# Patient Record
Sex: Male | Born: 1955 | Race: White | Hispanic: No | Marital: Married | Smoking: Former smoker
Health system: Southern US, Community
[De-identification: ages and names within clinical notes are randomized; demographics above are authoritative.]

## PROBLEM LIST (undated history)

## (undated) DIAGNOSIS — I10 Essential (primary) hypertension: Secondary | ICD-10-CM

## (undated) DIAGNOSIS — E559 Vitamin D deficiency, unspecified: Secondary | ICD-10-CM

## (undated) DIAGNOSIS — E785 Hyperlipidemia, unspecified: Secondary | ICD-10-CM

## (undated) HISTORY — DX: Vitamin D deficiency, unspecified: E55.9

## (undated) HISTORY — DX: Essential (primary) hypertension: I10

## (undated) HISTORY — DX: Hyperlipidemia, unspecified: E78.5

---
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
## 1997-07-28 ENCOUNTER — Encounter: Admission: RE | Admit: 1997-07-28 | Discharge: 1997-07-28 | Payer: Self-pay | Admitting: Family Medicine

## 1997-12-11 ENCOUNTER — Ambulatory Visit (HOSPITAL_COMMUNITY): Admission: RE | Admit: 1997-12-11 | Discharge: 1997-12-11 | Payer: Self-pay | Admitting: *Deleted

## 1998-10-21 ENCOUNTER — Encounter: Admission: RE | Admit: 1998-10-21 | Discharge: 1998-10-21 | Payer: Self-pay | Admitting: Family Medicine

## 1998-12-14 ENCOUNTER — Encounter: Admission: RE | Admit: 1998-12-14 | Discharge: 1998-12-14 | Payer: Self-pay | Admitting: Family Medicine

## 1999-10-18 ENCOUNTER — Ambulatory Visit (HOSPITAL_COMMUNITY): Admission: RE | Admit: 1999-10-18 | Discharge: 1999-10-18 | Payer: Self-pay | Admitting: Family Medicine

## 1999-10-18 ENCOUNTER — Encounter: Admission: RE | Admit: 1999-10-18 | Discharge: 1999-10-18 | Payer: Self-pay | Admitting: Family Medicine

## 1999-12-20 ENCOUNTER — Encounter: Admission: RE | Admit: 1999-12-20 | Discharge: 1999-12-20 | Payer: Self-pay | Admitting: Family Medicine

## 2000-06-12 ENCOUNTER — Encounter: Admission: RE | Admit: 2000-06-12 | Discharge: 2000-06-12 | Payer: Self-pay | Admitting: Family Medicine

## 2001-11-15 ENCOUNTER — Encounter: Admission: RE | Admit: 2001-11-15 | Discharge: 2001-11-15 | Payer: Self-pay | Admitting: Family Medicine

## 2002-10-15 ENCOUNTER — Encounter: Admission: RE | Admit: 2002-10-15 | Discharge: 2002-10-15 | Payer: Self-pay | Admitting: Sports Medicine

## 2002-10-15 ENCOUNTER — Encounter: Admission: RE | Admit: 2002-10-15 | Discharge: 2002-10-15 | Payer: Self-pay | Admitting: Family Medicine

## 2002-10-15 ENCOUNTER — Encounter: Payer: Self-pay | Admitting: Sports Medicine

## 2002-11-13 ENCOUNTER — Encounter: Admission: RE | Admit: 2002-11-13 | Discharge: 2002-11-13 | Payer: Self-pay | Admitting: Family Medicine

## 2003-03-15 HISTORY — PX: VASECTOMY: SHX75

## 2008-08-26 ENCOUNTER — Ambulatory Visit: Payer: Self-pay | Admitting: Gastroenterology

## 2008-09-08 ENCOUNTER — Encounter: Payer: Self-pay | Admitting: Gastroenterology

## 2008-09-08 ENCOUNTER — Ambulatory Visit: Payer: Self-pay | Admitting: Gastroenterology

## 2008-09-10 ENCOUNTER — Encounter: Payer: Self-pay | Admitting: Gastroenterology

## 2013-01-28 ENCOUNTER — Ambulatory Visit: Payer: Self-pay

## 2013-01-28 ENCOUNTER — Other Ambulatory Visit: Payer: Self-pay | Admitting: Occupational Medicine

## 2013-01-28 DIAGNOSIS — R52 Pain, unspecified: Secondary | ICD-10-CM

## 2013-07-09 ENCOUNTER — Other Ambulatory Visit: Payer: Self-pay | Admitting: Physician Assistant

## 2013-07-28 ENCOUNTER — Other Ambulatory Visit: Payer: Self-pay | Admitting: Physician Assistant

## 2013-07-28 DIAGNOSIS — E785 Hyperlipidemia, unspecified: Secondary | ICD-10-CM | POA: Insufficient documentation

## 2013-07-28 DIAGNOSIS — I10 Essential (primary) hypertension: Secondary | ICD-10-CM | POA: Insufficient documentation

## 2013-07-28 NOTE — Progress Notes (Signed)
Patient ID: Anthony Boone, male   DOB: 07/06/1955, 58 y.o.   MRN: [id number]   Annual Screening Comprehensive Examination  This very nice 58 y.o.  MWM presents for complete physical.  Patient has been followed for Labile HTN,  Prediabetes, Hyperlipidemia, and Vitamin D Deficiency.   Patient relates a work  injury on Nov 17th, 2014 with a torn Lt. Quadriceps for which he underwent surgery by Dr Beverely LowSteve Norris and patient has been out of work for the last 7 months. Ge continues in out-pt therapy, but still has great difficulty trying to walk up or down stairs .   Labile HTN predates since 2010. Patient's follow-up was been very sporatic and today's BP: 138/88 mmHg. Patient denies any cardiac symptoms as chest pain, palpitations, shortness of breath, dizziness or ankle swelling.   Patient's hyperlipidemia is controlled with diet and medications. Patient denies myalgias or other medication SE's. Last cholesterol last visit was 171, triglycerides 340, HDL 40 and LDL 63.     Patient has prediabetes/ with A1c 5.8% in June 2012  and last A1c was 5.4% in Oct 2012. Patient denies reactive hypoglycemic symptoms, visual blurring, diabetic polys or paresthesias.    Finally, patient has history of severe Vitamin D Deficiency of  24 in 2012 and last vitamin D 32 in Oct 2014.  Medication Sig  . bisoprolol-hydrochlorothiazide (ZIAC) 10-6.25 MG per tablet TAKE 1 TABLET BY MOUTH DAILY  . tadalafil (CIALIS) 20 MG tablet Take 20 mg by mouth daily as needed for erectile dysfunction.   No Known Allergies  Past Medical History  Diagnosis Date  . Hyperlipidemia   . Hypertension   . Vitamin D deficiency    Past Surgical History  Procedure Laterality Date  . Vasectomy  2005    Dr Edwin Capuckett  . Appendectomy  1974   Family History  Problem Relation Age of Onset  . Heart disease Father   . Hypertension Brother    History   Social History  . Marital Status: Married    Spouse Name: N/A    Number of Children:  N/A  . Years of Education: N/A   Occupational History  . 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 Currently on worker's comp.   Social History Main Topics  . Smoking status: Former Games developermoker  . Smokeless tobacco: Not on file  . Alcohol Use: Yes  . Drug Use: No  . Sexual Activity: Not on file   Other Topics Concern  . Not on file   Social History Narrative  . No narrative on file    ROS Constitutional: Denies fever, chills, weight loss/gain, headaches, insomnia, fatigue, night sweats or change in appetite. Eyes: Denies redness, blurred vision, diplopia, discharge, itchy or watery eyes.  ENT: Denies discharge, congestion, post nasal drip, epistaxis, sore throat, earache, hearing loss, dental pain, Tinnitus, Vertigo, Sinus pain or snoring.  Cardio: Denies chest pain, palpitations, irregular heartbeat, syncope, dyspnea, diaphoresis, orthopnea, PND, claudication or edema Respiratory: denies cough, dyspnea, DOE, pleurisy, hoarseness, laryngitis or wheezing.  Gastrointestinal: Denies dysphagia, heartburn, reflux, water brash, pain, cramps, nausea, vomiting, bloating, diarrhea, constipation, hematemesis, melena, hematochezia, jaundice or hemorrhoids Genitourinary: Denies dysuria, frequency, urgency, nocturia, hesitancy, discharge, hematuria or flank pain Musculoskeletal: Describes a limp favoring the left leg and pains and decreased ROM of the Left knee. Skin: Denies puritis, rash, hives, warts, acne, eczema or change in skin lesion Neuro: No weakness, tremor, incoordination, spasms, paresthesia or pain Psychiatric: Denies confusion, memory  loss or sensory loss Endocrine: Denies change in weight, skin, hair change, nocturia, and paresthesia, diabetic polys, visual blurring or hyper / hypo glycemic episodes.  Heme/Lymph: No excessive bleeding, bruising or enlarged lymph nodes.  Physical Exam  BP 138/88  Pulse 72  Temp(Src) 98.6 F (37 C)  Resp 16  Ht 5\' 10"  (1.778 m)   Wt 240 lb 9.6 oz (109.135 kg)  BMI 34.52 kg/m2  General Appearance: Well nourished, in no apparent distress. Eyes: PERRLA, EOMs, conjunctiva no swelling or erythema, normal fundi and vessels. Sinuses: No frontal/maxillary tenderness ENT/Mouth: EACs patent / TMs  nl. Nares clear without erythema, swelling, mucoid exudates. Oral hygiene is good. No erythema, swelling, or exudate. Tongue normal, non-obstructing. Tonsils not swollen or erythematous. Hearing normal.  Neck: Supple, thyroid normal. No bruits, nodes or JVD. Respiratory: Respiratory effort normal.  BS equal and clear bilateral without rales, rhonci, wheezing or stridor. Cardio: Heart sounds are normal with regular rate and rhythm and no murmurs, rubs or gallops. Peripheral pulses are normal and equal bilaterally without edema. No aortic or femoral bruits. Chest: symmetric with normal excursions and percussion.  Abdomen: Flat, soft, with bowl sounds. Nontender, no guarding, rebound, hernias, masses, or organomegaly.  Lymphatics: Non tender without lymphadenopathy.  Genitourinary: No hernias.Testes nl. DRE - prostate nl for age - smooth & firm w/o nodules. Musculoskeletal: Full ROM all peripheral extremities except Lt knee which has decreased ROM and crepitus. Skin: Warm and dry without rashes, lesions, cyanosis, clubbing or  ecchymosis.  Neuro: Cranial nerves intact, reflexes equal bilaterally. Normal muscle tone, no cerebellar symptoms. Sensation intact.  Pysch: Awake and oriented X 3, normal affect, insight and judgment appropriate.   Assessment and Plan  1. Annual Screening Examination 2. Hypertension  3. Hyperlipidemia 4. Pre Diabetes 5. Vitamin D Deficiency  Continue prudent diet as discussed, weight control, BP monitoring, regular exercise, and medications as discussed.  Discussed med effects and SE's. Routine screening labs and tests as requested with regular follow-up as recommended.

## 2013-07-28 NOTE — Patient Instructions (Addendum)
The [facility]   By   Dr Monico HoarJoel Fuhrman  Book & Audio CD's    Hypertension As your heart beats, it forces blood through your arteries. This force is your blood pressure. If the pressure is too high, it is called hypertension (HTN) or high blood pressure. HTN is dangerous because you may have it and not know it. High blood pressure may mean that your heart has to work harder to pump blood. Your arteries may be narrow or stiff. The extra work puts you at risk for heart disease, stroke, and other problems.  Blood pressure consists of two numbers, a higher number over a lower, 110/72, for example. It is stated as "110 over 72." The ideal is below 120 for the top number (systolic) and under 80 for the bottom (diastolic). Write down your blood pressure today. You should pay close attention to your blood pressure if you have certain conditions such as:  Heart failure.  Prior heart attack.  Diabetes  Chronic kidney disease.  Prior stroke.  Multiple risk factors for heart disease. To see if you have HTN, your blood pressure should be measured while you are seated with your arm held at the level of the heart. It should be measured at least twice. A one-time elevated blood pressure reading (especially in the Emergency Department) does not mean that you need treatment. There may be conditions in which the blood pressure is different between your right and left arms. It is important to see your caregiver soon for a recheck. Most people have essential hypertension which means that there is not a specific cause. This type of high blood pressure may be lowered by changing lifestyle factors such as:  Stress.  Smoking.  Lack of exercise.  Excessive weight.  Drug/tobacco/alcohol use.  Eating less salt. Most people do not have symptoms from high blood pressure until it has caused damage to the body. Effective treatment can often prevent, delay or reduce that damage. TREATMENT  When a cause  has been identified, treatment for high blood pressure is directed at the cause. There are a large number of medications to treat HTN. These fall into several categories, and your caregiver will help you select the medicines that are best for you. Medications may have side effects. You should review side effects with your caregiver. If your blood pressure stays high after you have made lifestyle changes or started on medicines,   Your medication(s) may need to be changed.  Other problems may need to be addressed.  Be certain you understand your prescriptions, and know how and when to take your medicine.  Be sure to follow up with your caregiver within the time frame advised (usually within two weeks) to have your blood pressure rechecked and to review your medications.  If you are taking more than one medicine to lower your blood pressure, make sure you know how and at what times they should be taken. Taking two medicines at the same time can result in blood pressure that is too low. SEEK IMMEDIATE MEDICAL CARE IF:  You develop a severe headache, blurred or changing vision, or confusion.  You have unusual weakness or numbness, or a faint feeling.  You have severe chest or abdominal pain, vomiting, or breathing problems. MAKE SURE YOU:   Understand these instructions.  Will watch your condition.  Will get help right away if you are not doing well or get worse.   Diabetes and Exercise Exercising regularly is important. It is  not just about losing weight. It has many health benefits, such as:  Improving your overall fitness, flexibility, and endurance.  Increasing your bone density.  Helping with weight control.  Decreasing your body fat.  Increasing your muscle strength.  Reducing stress and tension.  Improving your overall health. People with diabetes who exercise gain additional benefits because exercise:  Reduces appetite.  Improves the body's use of blood sugar  (glucose).  Helps lower or control blood glucose.  Decreases blood pressure.  Helps control blood lipids (such as cholesterol and triglycerides).  Improves the body's use of the hormone insulin by:  Increasing the body's insulin sensitivity.  Reducing the body's insulin needs.  Decreases the risk for heart disease because exercising:  Lowers cholesterol and triglycerides levels.  Increases the levels of good cholesterol (such as high-density lipoproteins [HDL]) in the body.  Lowers blood glucose levels. YOUR ACTIVITY PLAN  Choose an activity that you enjoy and set realistic goals. Your health care provider or diabetes educator can help you make an activity plan that works for you. You can break activities into 2 or 3 sessions throughout the day. Doing so is as good as one long session. Exercise ideas include:  Taking the dog for a walk.  Taking the stairs instead of the elevator.  Dancing to your favorite song.  Doing your favorite exercise with a friend. RECOMMENDATIONS FOR EXERCISING WITH TYPE 1 OR TYPE 2 DIABETES   Check your blood glucose before exercising. If blood glucose levels are greater than 240 mg/dL, check for urine ketones. Do not exercise if ketones are present.  Avoid injecting insulin into areas of the body that are going to be exercised. For example, avoid injecting insulin into:  The arms when playing tennis.  The legs when jogging.  Keep a record of:  Food intake before and after you exercise.  Expected peak times of insulin action.  Blood glucose levels before and after you exercise.  The type and amount of exercise you have done.  Review your records with your health care provider. Your health care provider will help you to develop guidelines for adjusting food intake and insulin amounts before and after exercising.  If you take insulin or oral hypoglycemic agents, watch for signs and symptoms of hypoglycemia. They  include:  Dizziness.  Shaking.  Sweating.  Chills.  Confusion.  Drink plenty of water while you exercise to prevent dehydration or heat stroke. Body water is lost during exercise and must be replaced.  Talk to your health care provider before starting an exercise program to make sure it is safe for you. Remember, almost any type of activity is better than none.    Cholesterol Cholesterol is a white, waxy, fat-like protein needed by your body in small amounts. The liver makes all the cholesterol you need. It is carried from the liver by the blood through the blood vessels. Deposits (plaque) may build up on blood vessel walls. This makes the arteries narrower and stiffer. Plaque increases the risk for heart attack and stroke. You cannot feel your cholesterol level even if it is very high. The only way to know is by a blood test to check your lipid (fats) levels. Once you know your cholesterol levels, you should keep a record of the test results. Work with your caregiver to to keep your levels in the desired range. WHAT THE RESULTS MEAN:  Total cholesterol is a rough measure of all the cholesterol in your blood.  LDL is the  so-called bad cholesterol. This is the type that deposits cholesterol in the walls of the arteries. You want this level to be low.  HDL is the good cholesterol because it cleans the arteries and carries the LDL away. You want this level to be high.  Triglycerides are fat that the body can either burn for energy or store. High levels are closely linked to heart disease. DESIRED LEVELS:  Total cholesterol below 200.  LDL below 100 for people at risk, below 70 for very high risk.  HDL above 50 is good, above 60 is best.  Triglycerides below 150. HOW TO LOWER YOUR CHOLESTEROL:  Diet.  Choose fish or white meat chicken and [place], roasted or baked. Limit fatty cuts of red meat, fried foods, and processed meats, such as sausage and lunch meat.  Eat lots of  fresh fruits and vegetables. Choose whole grains, beans, pasta, potatoes and cereals.  Use only small amounts of olive, corn or canola oils. Avoid butter, mayonnaise, shortening or palm kernel oils. Avoid foods with trans-fats.  Use skim/nonfat milk and low-fat/nonfat yogurt and cheeses. Avoid whole milk, cream, ice cream, egg yolks and cheeses. Healthy desserts include angel food cake, ginger snaps, animal crackers, hard candy, popsicles, and low-fat/nonfat frozen yogurt. Avoid pastries, cakes, pies and cookies.  Exercise.  A regular program helps decrease LDL and raises HDL.  Helps with weight control.  Do things that increase your activity level like gardening, walking, or taking the stairs.  Medication.  May be prescribed by your caregiver to help lowering cholesterol and the risk for heart disease.  You may need medicine even if your levels are normal if you have several risk factors. HOME CARE INSTRUCTIONS   Follow your diet and exercise programs as suggested by your caregiver.  Take medications as directed.  Have blood work done when your caregiver feels it is necessary. MAKE SURE YOU:   Understand these instructions.  Will watch your condition.  Will get help right away if you are not doing well or get worse.      Vitamin D Deficiency Vitamin D is an important vitamin that your body needs. Having too little of it in your body is called a deficiency. A very bad deficiency can make your bones soft and can cause a condition called rickets.  Vitamin D is important to your body for different reasons, such as:   It helps your body absorb 2 minerals called calcium and phosphorus.  It helps make your bones healthy.  It may prevent some diseases, such as diabetes and multiple sclerosis.  It helps your muscles and heart. You can get vitamin D in several ways. It is a natural part of some foods. The vitamin is also added to some dairy products and cereals. Some people  take vitamin D supplements. Also, your body makes vitamin D when you are in the sun. It changes the sun's rays into a form of the vitamin that your body can use. CAUSES   Not eating enough foods that contain vitamin D.  Not getting enough sunlight.  Having certain digestive system diseases that make it hard to absorb vitamin D. These diseases include Crohn's disease, chronic pancreatitis, and cystic fibrosis.  Having a surgery in which part of the stomach or small intestine is removed.  Being obese. Fat cells pull vitamin D out of your blood. That means that obese people may not have enough vitamin D left in their blood and in other body tissues.  Having chronic  kidney or liver disease. RISK FACTORS Risk factors are things that make you more likely to develop a vitamin D deficiency. They include:  Being older.  Not being able to get outside very much.  Living in a nursing home.  Having had broken bones.  Having weak or thin bones (osteoporosis).  Having a disease or condition that changes how your body absorbs vitamin D.  Having dark skin.  Some medicines such as seizure medicines or steroids.  Being overweight or obese. SYMPTOMS Mild cases of vitamin D deficiency may not have any symptoms. If you have a very bad case, symptoms may include:  Bone pain.  Muscle pain.  Falling often.  Broken bones caused by a minor injury, due to osteoporosis. DIAGNOSIS A blood test is the best way to tell if you have a vitamin D deficiency. TREATMENT Vitamin D deficiency can be treated in different ways. Treatment for vitamin D deficiency depends on what is causing it. Options include:  Taking vitamin D supplements.  Taking a calcium supplement. Your caregiver will suggest what dose is best for you. HOME CARE INSTRUCTIONS  Take any supplements that your caregiver prescribes. Follow the directions carefully. Take only the suggested amount.  Have your blood tested 2 months after  you start taking supplements.  Eat foods that contain vitamin D. Healthy choices include:  Fortified dairy products, cereals, or juices. Fortified means vitamin D has been added to the food. Check the label on the package to be sure.  Fatty fish like salmon or trout.  Eggs.  Oysters.  Do not use a tanning bed.  Keep your weight at a healthy level. Lose weight if you need to.  Keep all follow-up appointments. Your caregiver will need to perform blood tests to make sure your vitamin D deficiency is going away. SEEK MEDICAL CARE IF:  You have any questions about your treatment.  You continue to have symptoms of vitamin D deficiency.  You have nausea or vomiting.  You are constipated.  You feel confused.  You have severe abdominal or back pain. MAKE SURE YOU:  Understand these instructions.  Will watch your condition.  Will get help right away if you are not doing well or get worse.

## 2013-07-29 ENCOUNTER — Encounter: Payer: Self-pay | Admitting: Internal Medicine

## 2013-07-29 ENCOUNTER — Ambulatory Visit (INDEPENDENT_AMBULATORY_CARE_PROVIDER_SITE_OTHER): Payer: Federal, State, Local not specified - PPO | Admitting: Internal Medicine

## 2013-07-29 VITALS — BP 138/88 | HR 72 | Temp 98.6°F | Resp 16 | Ht 70.0 in | Wt 240.6 lb

## 2013-07-29 DIAGNOSIS — R7309 Other abnormal glucose: Secondary | ICD-10-CM

## 2013-07-29 DIAGNOSIS — R7402 Elevation of levels of lactic acid dehydrogenase (LDH): Secondary | ICD-10-CM

## 2013-07-29 DIAGNOSIS — R740 Nonspecific elevation of levels of transaminase and lactic acid dehydrogenase [LDH]: Secondary | ICD-10-CM

## 2013-07-29 DIAGNOSIS — Z79899 Other long term (current) drug therapy: Secondary | ICD-10-CM | POA: Insufficient documentation

## 2013-07-29 DIAGNOSIS — Z111 Encounter for screening for respiratory tuberculosis: Secondary | ICD-10-CM

## 2013-07-29 DIAGNOSIS — Z113 Encounter for screening for infections with a predominantly sexual mode of transmission: Secondary | ICD-10-CM

## 2013-07-29 DIAGNOSIS — Z1212 Encounter for screening for malignant neoplasm of rectum: Secondary | ICD-10-CM

## 2013-07-29 DIAGNOSIS — E559 Vitamin D deficiency, unspecified: Secondary | ICD-10-CM

## 2013-07-29 DIAGNOSIS — I10 Essential (primary) hypertension: Secondary | ICD-10-CM

## 2013-07-29 DIAGNOSIS — Z125 Encounter for screening for malignant neoplasm of prostate: Secondary | ICD-10-CM

## 2013-07-29 DIAGNOSIS — Z0000 Encounter for general adult medical examination without abnormal findings: Secondary | ICD-10-CM

## 2013-07-29 HISTORY — DX: Other long term (current) drug therapy: Z79.899

## 2013-07-29 HISTORY — DX: Other abnormal glucose: R73.09

## 2013-07-29 LAB — CBC WITH DIFFERENTIAL/PLATELET
Basophils Absolute: 0.1 10*3/uL (ref 0.0–0.1)
Basophils Relative: 1 % (ref 0–1)
EOS PCT: 1 % (ref 0–5)
Eosinophils Absolute: 0.1 10*3/uL (ref 0.0–0.7)
HCT: 43 % (ref 39.0–52.0)
Hemoglobin: 15.2 g/dL (ref 13.0–17.0)
Lymphocytes Relative: 35 % (ref 12–46)
Lymphs Abs: 1.9 10*3/uL (ref 0.7–4.0)
MCH: 30 pg (ref 26.0–34.0)
MCHC: 35.3 g/dL (ref 30.0–36.0)
MCV: 85 fL (ref 78.0–100.0)
Monocytes Absolute: 0.4 10*3/uL (ref 0.1–1.0)
Monocytes Relative: 7 % (ref 3–12)
Neutro Abs: 3 10*3/uL (ref 1.7–7.7)
Neutrophils Relative %: 56 % (ref 43–77)
Platelets: 155 10*3/uL (ref 150–400)
RBC: 5.06 MIL/uL (ref 4.22–5.81)
RDW: 14.8 % (ref 11.5–15.5)
WBC: 5.4 10*3/uL (ref 4.0–10.5)

## 2013-07-29 LAB — HEMOGLOBIN A1C
Hgb A1c MFr Bld: 5.8 % — ABNORMAL HIGH (ref <5.7)
Mean Plasma Glucose: 120 mg/dL — ABNORMAL HIGH (ref <117)

## 2013-07-29 MED ORDER — HYDROCHLOROTHIAZIDE 25 MG PO TABS: 25.0000 mg | ORAL_TABLET | Freq: Every day | ORAL | Status: DC

## 2013-07-29 MED ORDER — BISOPROLOL-HYDROCHLOROTHIAZIDE 10-6.25 MG PO TABS: 1.0000 | ORAL_TABLET | Freq: Every day | ORAL | Status: DC

## 2013-07-29 MED ORDER — VITAMIN D (ERGOCALCIFEROL) 1.25 MG (50000 UNIT) PO CAPS: ORAL_CAPSULE | ORAL | Status: AC

## 2013-07-30 LAB — LIPID PANEL
CHOL/HDL RATIO: 3.8 ratio
Cholesterol: 125 mg/dL (ref 0–200)
HDL: 33 mg/dL — AB (ref >39)
LDL CALC: 50 mg/dL (ref 0–99)
TRIGLYCERIDES: 211 mg/dL — AB (ref <150)
VLDL: 42 mg/dL — ABNORMAL HIGH (ref 0–40)

## 2013-07-30 LAB — HEPATIC FUNCTION PANEL
ALK PHOS: 64 U/L (ref 39–117)
ALT: 25 U/L (ref 0–53)
AST: 19 U/L (ref 0–37)
Albumin: 4.6 g/dL (ref 3.5–5.2)
BILIRUBIN DIRECT: 0.1 mg/dL (ref 0.0–0.3)
BILIRUBIN INDIRECT: 0.4 mg/dL (ref 0.2–1.2)
BILIRUBIN TOTAL: 0.5 mg/dL (ref 0.2–1.2)
Total Protein: 7 g/dL (ref 6.0–8.3)

## 2013-07-30 LAB — HEPATITIS B CORE ANTIBODY, TOTAL: Hep B Core Total Ab: NONREACTIVE

## 2013-07-30 LAB — BASIC METABOLIC PANEL WITH GFR
BUN: 13 mg/dL (ref 6–23)
CO2: 24 meq/L (ref 19–32)
CREATININE: 0.88 mg/dL (ref 0.50–1.35)
Calcium: 9.1 mg/dL (ref 8.4–10.5)
Chloride: 103 mEq/L (ref 96–112)
GFR, Est African American: >89 mL/min
GFR, Est Non African American: >89 mL/min
Glucose, Bld: 84 mg/dL (ref 70–99)
Potassium: 4.3 mEq/L (ref 3.5–5.3)
SODIUM: 138 meq/L (ref 135–145)

## 2013-07-30 LAB — MAGNESIUM: Magnesium: 1.9 mg/dL (ref 1.5–2.5)

## 2013-07-30 LAB — MICROALBUMIN / CREATININE URINE RATIO
Creatinine, Urine: 125.3 mg/dL
MICROALB/CREAT RATIO: 5.3 mg/g (ref 0.0–30.0)
Microalb, Ur: 0.67 mg/dL (ref 0.00–1.89)

## 2013-07-30 LAB — HIV ANTIBODY (ROUTINE TESTING W REFLEX): HIV 1&2 Ab, 4th Generation: NONREACTIVE

## 2013-07-30 LAB — HEPATITIS C ANTIBODY: HCV AB: NEGATIVE

## 2013-07-30 LAB — VITAMIN B12: VITAMIN B 12: 214 pg/mL (ref 211–911)

## 2013-07-30 LAB — RPR

## 2013-07-30 LAB — TSH: TSH: 1.613 u[IU]/mL (ref 0.350–4.500)

## 2013-07-30 LAB — INSULIN, FASTING: Insulin fasting, serum: 38 u[IU]/mL — ABNORMAL HIGH (ref 3–28)

## 2013-07-30 LAB — URINALYSIS, MICROSCOPIC ONLY
Bacteria, UA: NONE SEEN
CASTS: NONE SEEN
Crystals: NONE SEEN
Squamous Epithelial / LPF: NONE SEEN

## 2013-07-30 LAB — TESTOSTERONE: TESTOSTERONE: 252 ng/dL — AB (ref 300–890)

## 2013-07-30 LAB — PSA: PSA: 0.59 ng/mL (ref <=4.00)

## 2013-07-30 LAB — VITAMIN D 25 HYDROXY (VIT D DEFICIENCY, FRACTURES): Vit D, 25-Hydroxy: 30 ng/mL (ref 30–89)

## 2013-07-30 LAB — HEPATITIS A ANTIBODY, TOTAL: HEP A TOTAL AB: NONREACTIVE

## 2013-07-30 LAB — HEPATITIS B SURFACE ANTIBODY,QUALITATIVE: Hep B S Ab: NEGATIVE

## 2013-07-31 LAB — HEPATITIS B E ANTIBODY: Hepatitis Be Antibody: NONREACTIVE

## 2013-09-05 ENCOUNTER — Other Ambulatory Visit: Payer: Self-pay

## 2013-09-05 MED ORDER — SILDENAFIL CITRATE 20 MG PO TABS: ORAL_TABLET | ORAL | Status: DC

## 2013-11-01 ENCOUNTER — Ambulatory Visit: Payer: Self-pay | Admitting: Physician Assistant

## 2014-02-03 ENCOUNTER — Ambulatory Visit: Payer: Self-pay | Admitting: Internal Medicine

## 2014-07-04 ENCOUNTER — Encounter: Payer: Self-pay | Admitting: Internal Medicine

## 2014-07-04 ENCOUNTER — Ambulatory Visit: Payer: Self-pay | Admitting: Internal Medicine

## 2014-07-04 VITALS — BP 142/88 | HR 64 | Temp 97.3°F | Resp 16 | Ht 70.0 in | Wt 239.8 lb

## 2014-07-04 DIAGNOSIS — E559 Vitamin D deficiency, unspecified: Secondary | ICD-10-CM

## 2014-07-04 DIAGNOSIS — Z79899 Other long term (current) drug therapy: Secondary | ICD-10-CM

## 2014-07-04 DIAGNOSIS — R7303 Prediabetes: Secondary | ICD-10-CM

## 2014-07-04 DIAGNOSIS — I10 Essential (primary) hypertension: Secondary | ICD-10-CM

## 2014-07-04 DIAGNOSIS — E785 Hyperlipidemia, unspecified: Secondary | ICD-10-CM

## 2014-07-04 NOTE — Progress Notes (Signed)
   Subjective:    Patient ID: Anthony Boone, male    DOB: 02/13/1956, 59 y.o.   MRN: [id number]  HPI Pt presents with a several day hx/o a tender tip of his nose. Medication Sig  . hydrochlorothiazide (HYDRODIURIL) 25 MG tablet Take 1 tablet (25 mg total) by mouth daily. For fluid  . sildenafil (REVATIO) 20 MG tablet Take one to five tablets daily as needed  . bisoprolol-hctz (ZIAC) 10-6.25 MG per tablet TAKE 1 TABLET BY MOUTH DAILY   No Known Allergies  Review of Systems 10 point systems review negative except as above.     Objective:   Physical Exam BP 142/88 mmHg  Pulse 64  Temp(Src) 97.3 F (36.3 C)  Resp 16  Ht 5\' 10"  (1.778 m)  Wt 239 lb 12.8 oz (108.773 kg)  BMI 34.41 kg/m2  Tip of nose is tender with a hint of slight erythema. No pustules seem. No flagrant cellulitis/lymphangitis.   Naso/oropharynx - clear    Assessment & Plan:   1) Cellulitis , nose   Rx: Keflex 500 mg #56 - qid  Discussed meds/SE's.  ROV - prn

## 2014-07-10 ENCOUNTER — Encounter: Payer: Self-pay | Admitting: Physician Assistant

## 2014-07-10 ENCOUNTER — Ambulatory Visit (INDEPENDENT_AMBULATORY_CARE_PROVIDER_SITE_OTHER): Payer: BLUE CROSS/BLUE SHIELD | Admitting: Physician Assistant

## 2014-07-10 VITALS — BP 122/78 | HR 72 | Temp 97.7°F | Resp 16 | Ht 70.0 in | Wt 239.0 lb

## 2014-07-10 DIAGNOSIS — R8290 Unspecified abnormal findings in urine: Secondary | ICD-10-CM

## 2014-07-10 DIAGNOSIS — Z79899 Other long term (current) drug therapy: Secondary | ICD-10-CM

## 2014-07-10 DIAGNOSIS — R109 Unspecified abdominal pain: Secondary | ICD-10-CM

## 2014-07-10 DIAGNOSIS — M545 Low back pain, unspecified: Secondary | ICD-10-CM

## 2014-07-10 LAB — HEPATIC FUNCTION PANEL
ALT: 22 U/L (ref 0–53)
AST: 18 U/L (ref 0–37)
Albumin: 4.4 g/dL (ref 3.5–5.2)
Alkaline Phosphatase: 68 U/L (ref 39–117)
Bilirubin, Direct: 0.1 mg/dL (ref 0.0–0.3)
Indirect Bilirubin: 0.6 mg/dL (ref 0.2–1.2)
TOTAL PROTEIN: 7.5 g/dL (ref 6.0–8.3)
Total Bilirubin: 0.7 mg/dL (ref 0.2–1.2)

## 2014-07-10 LAB — BASIC METABOLIC PANEL WITH GFR
BUN: 16 mg/dL (ref 6–23)
CALCIUM: 9.2 mg/dL (ref 8.4–10.5)
CO2: 27 meq/L (ref 19–32)
CREATININE: 0.98 mg/dL (ref 0.50–1.35)
Chloride: 101 mEq/L (ref 96–112)
GFR, EST NON AFRICAN AMERICAN: 85 mL/min
GFR, Est African American: >89 mL/min
Glucose, Bld: 93 mg/dL (ref 70–99)
Potassium: 4.2 mEq/L (ref 3.5–5.3)
Sodium: 139 mEq/L (ref 135–145)

## 2014-07-10 LAB — CBC WITH DIFFERENTIAL/PLATELET
Basophils Absolute: 0.1 10*3/uL (ref 0.0–0.1)
Basophils Relative: 1 % (ref 0–1)
EOS ABS: 0.1 10*3/uL (ref 0.0–0.7)
EOS PCT: 2 % (ref 0–5)
HEMATOCRIT: 46.2 % (ref 39.0–52.0)
Hemoglobin: 16.1 g/dL (ref 13.0–17.0)
LYMPHS ABS: 2.2 10*3/uL (ref 0.7–4.0)
LYMPHS PCT: 33 % (ref 12–46)
MCH: 29.9 pg (ref 26.0–34.0)
MCHC: 34.8 g/dL (ref 30.0–36.0)
MCV: 85.9 fL (ref 78.0–100.0)
MONO ABS: 0.4 10*3/uL (ref 0.1–1.0)
MONOS PCT: 6 % (ref 3–12)
MPV: 9.4 fL (ref 8.6–12.4)
Neutro Abs: 3.9 10*3/uL (ref 1.7–7.7)
Neutrophils Relative %: 58 % (ref 43–77)
Platelets: 156 10*3/uL (ref 150–400)
RBC: 5.38 MIL/uL (ref 4.22–5.81)
RDW: 14.8 % (ref 11.5–15.5)
WBC: 6.8 10*3/uL (ref 4.0–10.5)

## 2014-07-10 LAB — MAGNESIUM: Magnesium: 2 mg/dL (ref 1.5–2.5)

## 2014-07-10 NOTE — Patient Instructions (Addendum)
I think it is possible that you have sleep apnea. It can cause interrupted sleep, headaches, frequent awakenings, fatigue, dry mouth, fast/slow heart beats, memory issues, anxiety/depression, swelling, numbness tingling hands/feet, weight gain, shortness of breath, and the list goes on. Sleep apnea needs to be ruled out because if it is left untreated it does eventually lead to abnormal heart beats, lung failure or heart failure as well as increasing the risk of heart attack and stroke. There are masks you can wear OR a mouth piece that I can give you information about. Often times though people feel MUCH better after getting treatment.   Sleep Apnea  Sleep apnea is a sleep disorder characterized by abnormal pauses in breathing while you sleep. When your breathing pauses, the level of oxygen in your blood decreases. This causes you to move out of deep sleep and into light sleep. As a result, your quality of sleep is poor, and the system that carries your blood throughout your body (cardiovascular system) experiences stress. If sleep apnea remains untreated, the following conditions can develop:  High blood pressure (hypertension).  Coronary artery disease.  Inability to achieve or maintain an erection (impotence).  Impairment of your thought process (cognitive dysfunction). There are three types of sleep apnea: 1. Obstructive sleep apnea--Pauses in breathing during sleep because of a blocked airway. 2. Central sleep apnea--Pauses in breathing during sleep because the area of the brain that controls your breathing does not send the correct signals to the muscles that control breathing. 3. Mixed sleep apnea--A combination of both obstructive and central sleep apnea.  RISK FACTORS The following risk factors can increase your risk of developing sleep apnea:  Being overweight.  Smoking.  Having narrow passages in your nose and throat.  Being of older age.  Being male.  Alcohol use.   Sedative and tranquilizer use.  Ethnicity. Among individuals younger than 35 years, African Americans are at increased risk of sleep apnea. SYMPTOMS   Difficulty staying asleep.  Daytime sleepiness and fatigue.  Loss of energy.  Irritability.  Loud, heavy snoring.  Morning headaches.  Trouble concentrating.  Forgetfulness.  Decreased interest in sex. DIAGNOSIS  In order to diagnose sleep apnea, your caregiver will perform a physical examination. Your caregiver may suggest that you take a home sleep test. Your caregiver may also recommend that you spend the night in a sleep lab. In the sleep lab, several monitors record information about your heart, lungs, and brain while you sleep. Your leg and arm movements and blood oxygen level are also recorded. TREATMENT The following actions may help to resolve mild sleep apnea:  Sleeping on your side.   Using a decongestant if you have nasal congestion.   Avoiding the use of depressants, including alcohol, sedatives, and narcotics.   Losing weight and modifying your diet if you are overweight. There also are devices and treatments to help open your airway:  Oral appliances. These are custom-made mouthpieces that shift your lower jaw forward and slightly open your bite. This opens your airway.  Devices that create positive airway pressure. This positive pressure "splints" your airway open to help you breathe better during sleep. The following devices create positive airway pressure:  Continuous positive airway pressure (CPAP) device. The CPAP device creates a continuous level of air pressure with an air pump. The air is delivered to your airway through a mask while you sleep. This continuous pressure keeps your airway open.  Nasal expiratory positive airway pressure (EPAP) device. The EPAP device  creates positive air pressure as you exhale. The device consists of single-use valves, which are inserted into each nostril and held in  place by adhesive. The valves create very little resistance when you inhale but create much more resistance when you exhale. That increased resistance creates the positive airway pressure. This positive pressure while you exhale keeps your airway open, making it easier to breath when you inhale again.  Bilevel positive airway pressure (BPAP) device. The BPAP device is used mainly in patients with central sleep apnea. This device is similar to the CPAP device because it also uses an air pump to deliver continuous air pressure through a mask. However, with the BPAP machine, the pressure is set at two different levels. The pressure when you exhale is lower than the pressure when you inhale.  Surgery. Typically, surgery is only done if you cannot comply with less invasive treatments or if the less invasive treatments do not improve your condition. Surgery involves removing excess tissue in your airway to create a wider passage way. Document Released: 02/18/2002 Document Revised: 06/25/2012 Document Reviewed: 07/07/2011 [facility] Patient Information 2015 [place], [place]. This information is not intended to replace advice given to you by your health care provider. Make sure you discuss any questions you have with your health care provider.   Flank Pain Flank pain refers to pain that is located on the side of the body between the upper abdomen and the back. The pain may occur over a short period of time (acute) or may be long-term or reoccurring (chronic). It may be mild or severe. Flank pain can be caused by many things. CAUSES  Some of the more common causes of flank pain include:  Muscle strains.   Muscle spasms.   A disease of your spine (vertebral disk disease).   A lung infection (pneumonia).   Fluid around your lungs (pulmonary edema).   A kidney infection.   Kidney stones.   A very painful skin rash caused by the chickenpox virus (shingles).   Gallbladder disease.  HOME CARE  INSTRUCTIONS  Home care will depend on the cause of your pain. In general,  Rest as directed by your caregiver.  Drink enough fluids to keep your urine clear or pale yellow.  Only take over-the-counter or prescription medicines as directed by your caregiver. Some medicines may help relieve the pain.  Tell your caregiver about any changes in your pain.  Follow up with your caregiver as directed. SEEK IMMEDIATE MEDICAL CARE IF:   Your pain is not controlled with medicine.   You have new or worsening symptoms.  Your pain increases.   You have abdominal pain.   You have shortness of breath.   You have persistent nausea or vomiting.   You have swelling in your abdomen.   You feel faint or pass out.   You have blood in your urine.  You have a fever or persistent symptoms for more than 2-3 days.  You have a fever and your symptoms suddenly get worse. MAKE SURE YOU:   Understand these instructions.  Will watch your condition.  Will get help right away if you are not doing well or get worse. Document Released: 04/21/2005 Document Revised: 11/23/2011 Document Reviewed: 10/13/2011 [facility] Patient Information 2015 [place], [place]. This information is not intended to replace advice given to you by your health care provider. Make sure you discuss any questions you have with your health care provider.

## 2014-07-10 NOTE — Progress Notes (Signed)
Subjective:    Patient ID: Anthony Boone, male    DOB: 10/18/1955, 59 y.o.   MRN: [id number]  HPI 59 y.o. male complains of back pain and history of tick bite. Tick bite 1 week ago, on less than 24 hours.  Back pain x 3 days, dull ache started on left and now on both side, some radiation to the AB. No injuries. Pain is worse with lying on his left side, sitting up is better. Has been on zantac for reflux and has been on ibuprofen for pain.  + AB bloating, nausea, heartburn, did have chills yesterday, dark urine.  No rashes, fever, vomiting, diarrhea, constipation, hesitancy, dribbling, frequency, urgency. Does have a history of kidney stones.   Blood pressure 122/78, pulse 72, temperature 97.7 F (36.5 C), resp. rate 16, height 5\' 10"  (1.778 m), weight 239 lb (108.41 kg).   Current Outpatient Prescriptions on File Prior to Visit  Medication Sig Dispense Refill  . bisoprolol-hydrochlorothiazide (ZIAC) 10-6.25 MG per tablet Take 1 tablet by mouth daily. For BP 90 tablet 99  . hydrochlorothiazide (HYDRODIURIL) 25 MG tablet Take 1 tablet (25 mg total) by mouth daily. For fluid 90 tablet 99  . pseudoephedrine-guaifenesin (MUCINEX D) 60-600 MG per tablet Take by mouth.    . sildenafil (REVATIO) 20 MG tablet Take one to five tablets daily as needed 30 tablet prn   No current facility-administered medications on file prior to visit.   Past Medical History  Diagnosis Date  . Hyperlipidemia   . Hypertension   . Vitamin D deficiency     Review of Systems  Constitutional: Positive for chills. Negative for fever, diaphoresis, activity change, appetite change, fatigue and unexpected weight change.  HENT: Negative.   Respiratory: Negative.   Cardiovascular: Negative.   Gastrointestinal: Positive for nausea, abdominal pain and abdominal distention. Negative for vomiting, diarrhea, constipation, blood in stool, anal bleeding and rectal pain.  Genitourinary: Positive for flank pain. Negative for  dysuria, urgency, frequency, hematuria, decreased urine volume, discharge, penile swelling, scrotal swelling, enuresis, difficulty urinating, genital sores, penile pain and testicular pain.       Dark urine  Musculoskeletal: Positive for back pain. Negative for myalgias, joint swelling, arthralgias, gait problem, neck pain and neck stiffness.  Skin: Negative.   Neurological: Negative.   Psychiatric/Behavioral: Negative.        Objective:   Physical Exam  Constitutional: He appears well-developed and well-nourished. No distress.  HENT:  Head: Normocephalic and atraumatic.  Crowded mouth  Eyes: Conjunctivae are normal. Pupils are equal, round, and reactive to light.  Neck: Normal range of motion. Neck supple.  Cardiovascular: Normal rate and regular rhythm.   No murmur heard. Pulmonary/Chest: Breath sounds normal. No respiratory distress. He has no wheezes. He has no rales. He exhibits no tenderness.  Abdominal: Soft. Bowel sounds are normal. He exhibits no distension and no mass. There is no hepatosplenomegaly, splenomegaly or hepatomegaly. There is tenderness (epigastric). There is CVA tenderness. There is no rebound, no guarding and negative Murphy's sign. No hernia.  Musculoskeletal:  Patient is able to ambulate well. Gait is  Antalgic. Straight leg raising with dorsiflexion negative bilaterally for radicular symptoms. Sensory exam in the legs are normal. Knee reflexes are normal Ankle reflexes are normal Strength is normal and symmetric in arms and legs. There is not SI tenderness to palpation.  There isparaspinal muscle spasm.  There is not midline tenderness.  ROM of spine with  limited in all spheres due to pain.  Lymphadenopathy:    He has no cervical adenopathy.  Skin: Skin is warm and dry. No rash noted.      Assessment & Plan:  Left back/flank pain Does have a history of kidney stones, check labs, declines pain meds at this time, increase fluids, may be musculoskeletal as  well with pain with movement, continue ibuprofen, heat/ice, rest.   Tick bite Less than a day, no rash, will monitor.

## 2014-07-11 LAB — URINALYSIS, ROUTINE W REFLEX MICROSCOPIC
BILIRUBIN URINE: NEGATIVE
GLUCOSE, UA: NEGATIVE mg/dL
Hgb urine dipstick: NEGATIVE
Ketones, ur: NEGATIVE mg/dL
Leukocytes, UA: NEGATIVE
Nitrite: NEGATIVE
PH: 6.5 (ref 5.0–8.0)
Protein, ur: NEGATIVE mg/dL
SPECIFIC GRAVITY, URINE: 1.016 (ref 1.005–1.030)
Urobilinogen, UA: 0.2 mg/dL (ref 0.0–1.0)

## 2014-07-11 LAB — URINE CULTURE
COLONY COUNT: NO GROWTH
Organism ID, Bacteria: NO GROWTH

## 2014-07-31 ENCOUNTER — Ambulatory Visit (INDEPENDENT_AMBULATORY_CARE_PROVIDER_SITE_OTHER): Payer: BLUE CROSS/BLUE SHIELD | Admitting: Internal Medicine

## 2014-07-31 ENCOUNTER — Other Ambulatory Visit: Payer: Self-pay | Admitting: Internal Medicine

## 2014-07-31 ENCOUNTER — Encounter: Payer: Self-pay | Admitting: Internal Medicine

## 2014-07-31 VITALS — BP 146/96 | HR 68 | Temp 97.1°F | Resp 16 | Ht 70.0 in | Wt 239.4 lb

## 2014-07-31 DIAGNOSIS — E559 Vitamin D deficiency, unspecified: Secondary | ICD-10-CM

## 2014-07-31 DIAGNOSIS — Z125 Encounter for screening for malignant neoplasm of prostate: Secondary | ICD-10-CM

## 2014-07-31 DIAGNOSIS — Z111 Encounter for screening for respiratory tuberculosis: Secondary | ICD-10-CM

## 2014-07-31 DIAGNOSIS — I10 Essential (primary) hypertension: Secondary | ICD-10-CM

## 2014-07-31 DIAGNOSIS — R7303 Prediabetes: Secondary | ICD-10-CM

## 2014-07-31 DIAGNOSIS — Z1212 Encounter for screening for malignant neoplasm of rectum: Secondary | ICD-10-CM

## 2014-07-31 DIAGNOSIS — E785 Hyperlipidemia, unspecified: Secondary | ICD-10-CM

## 2014-07-31 DIAGNOSIS — Z79899 Other long term (current) drug therapy: Secondary | ICD-10-CM

## 2014-07-31 DIAGNOSIS — R5383 Other fatigue: Secondary | ICD-10-CM

## 2014-07-31 DIAGNOSIS — R7309 Other abnormal glucose: Secondary | ICD-10-CM

## 2014-07-31 LAB — CBC WITH DIFFERENTIAL/PLATELET
Basophils Absolute: 0.1 10*3/uL (ref 0.0–0.1)
Basophils Relative: 1 % (ref 0–1)
EOS ABS: 0.1 10*3/uL (ref 0.0–0.7)
Eosinophils Relative: 1 % (ref 0–5)
HCT: 46.9 % (ref 39.0–52.0)
Hemoglobin: 16.2 g/dL (ref 13.0–17.0)
Lymphocytes Relative: 32 % (ref 12–46)
Lymphs Abs: 2.1 10*3/uL (ref 0.7–4.0)
MCH: 30.1 pg (ref 26.0–34.0)
MCHC: 34.5 g/dL (ref 30.0–36.0)
MCV: 87 fL (ref 78.0–100.0)
MONOS PCT: 8 % (ref 3–12)
MPV: 9.3 fL (ref 8.6–12.4)
Monocytes Absolute: 0.5 10*3/uL (ref 0.1–1.0)
Neutro Abs: 3.8 10*3/uL (ref 1.7–7.7)
Neutrophils Relative %: 58 % (ref 43–77)
PLATELETS: 153 10*3/uL (ref 150–400)
RBC: 5.39 MIL/uL (ref 4.22–5.81)
RDW: 15 % (ref 11.5–15.5)
WBC: 6.5 10*3/uL (ref 4.0–10.5)

## 2014-07-31 MED ORDER — LISINOPRIL 40 MG PO TABS: ORAL_TABLET | ORAL | Status: DC

## 2014-07-31 NOTE — Progress Notes (Signed)
Patient ID: Anthony Boone, male   DOB: 05/31/1955, 59 y.o.   MRN: [id number]   Annual Comprehensive Examination  This very nice 59 y.o. MWM presents for complete physical.  Patient has been followed for HTN, Prediabetes, Hyperlipidemia, and Vitamin D Deficiency.   Labile HTN predates since 2010. Patient's BP has been controlled at home.Today's BP: (!) 146/96 mmHg. Patient denies any cardiac symptoms as chest pain, palpitations, shortness of breath, dizziness or ankle swelling.   Patient's hyperlipidemia is controlled with diet. Patient denies myalgias or other medication SE's. Last lipids were   Lab Results  Component Value Date   CHOL 125 07/29/2013   HDL 33* 07/29/2013   LDLCALC 50 07/29/2013   TRIG 211* 07/29/2013   CHOLHDL 3.8 07/29/2013    Patient has Morbid Obesity (BMI 34.35) and consequent prediabetes since June 2012 with A1c 5.8% and patient denies reactive hypoglycemic symptoms, visual blurring, diabetic polys or paresthesias. Last A1c was      Finally, patient has history of Vitamin D Deficiency of "24" in 2012 and last vitamin D was still very low at "30" in May 2015.      Medication Sig  . bisoprolol-hydrochlorothiazide (ZIAC) 10-6.25 MG per tablet TAKE 1 TABLET EVERY DAY FOR BLOOD PRESSURE  . hydrochlorothiazide (HYDRODIURIL) 25 MG tablet TAKE 1 TABLET (25 MG TOTAL) BY MOUTH DAILY. FOR FLUID  . pseudoephedrine-guaifenesin (MUCINEX D) 60-600 MG per tablet Take by mouth.  . sildenafil (REVATIO) 20 MG tablet Take one to five tablets daily as needed    No Known Allergies Past Medical History  Diagnosis Date  . Hyperlipidemia   . Hypertension   . Vitamin D deficiency    Health Maintenance  Topic Date Due  . INFLUENZA VACCINE  10/13/2014  . COLONOSCOPY  09/09/2018  . TETANUS/TDAP  03/14/2021  . HIV Screening  Completed   Immunization History  Administered Date(s) Administered  . Td 11/12/2001  . Tdap 03/15/2011   Past Surgical History  Procedure Laterality Date   . Vasectomy  2005    Dr Edwin Capuckett  . Appendectomy  1974   Family History  Problem Relation Age of Onset  . Heart disease Father   . Hypertension Brother    History   Social History  . Marital Status: Married    Spouse Name: N/A  . Number of Children: N/A  . Years of Education: N/A   Occupational History  . Not on file.   Social History Main Topics  . Smoking status: Former Games developermoker  . Smokeless tobacco: Not on file  . Alcohol Use: Yes  . Drug Use: No  . Sexual Activity: Not on file    ROS Constitutional: Denies fever, chills, weight loss/gain, headaches, insomnia,  night sweats or change in appetite. Does c/o fatigue. Eyes: Denies redness, blurred vision, diplopia, discharge, itchy or watery eyes.  ENT: Denies discharge, congestion, post nasal drip, epistaxis, sore throat, earache, hearing loss, dental pain, Tinnitus, Vertigo, Sinus pain or snoring.  Cardio: Denies chest pain, palpitations, irregular heartbeat, syncope, dyspnea, diaphoresis, orthopnea, PND, claudication or edema Respiratory: denies cough, dyspnea, DOE, pleurisy, hoarseness, laryngitis or wheezing.  Gastrointestinal: Denies dysphagia, heartburn, reflux, water brash, pain, cramps, nausea, vomiting, bloating, diarrhea, constipation, hematemesis, melena, hematochezia, jaundice or hemorrhoids Genitourinary: Denies dysuria, frequency, urgency, nocturia, hesitancy, discharge, hematuria or flank pain Musculoskeletal: Denies arthralgia, myalgia, stiffness, Jt. Swelling, pain, limp or strain/sprain. Denies Falls. Skin: Denies puritis, rash, hives, warts, acne, eczema or change in skin lesion Neuro: No weakness, tremor, incoordination, spasms,  paresthesia or pain Psychiatric: Denies confusion, memory loss or sensory loss. Denies Depression. Endocrine: Denies change in weight, skin, hair change, nocturia, and paresthesia, diabetic polys, visual blurring or hyper / hypo glycemic episodes.  Heme/Lymph: No excessive bleeding,  bruising or enlarged lymph nodes.  Physical Exam  BP 146/96   Pulse 68  Temp 97.1 F   Resp 16  Ht 5\' 10"    Wt 239 lb 6.4 oz     BMI 34.35  General Appearance: Well nourished, in no apparent distress. Eyes: PERRLA, EOMs, conjunctiva no swelling or erythema, normal fundi and vessels. Sinuses: No frontal/maxillary tenderness ENT/Mouth: EACs patent / TMs  nl. Nares clear without erythema, swelling, mucoid exudates. Oral hygiene is good. No erythema, swelling, or exudate. Tongue normal, non-obstructing. Tonsils not swollen or erythematous. Hearing normal.  Neck: Supple, thyroid normal. No bruits, nodes or JVD. Respiratory: Respiratory effort normal.  BS equal and clear bilateral without rales, rhonci, wheezing or stridor. Cardio: Heart sounds are normal with regular rate and rhythm and no murmurs, rubs or gallops. Peripheral pulses are normal and equal bilaterally without edema. No aortic or femoral bruits. Chest: symmetric with normal excursions and percussion.  Abdomen: Flat, soft, with bowel sounds. Nontender, no guarding, rebound, hernias, masses, or organomegaly.  Lymphatics: Non tender without lymphadenopathy.  Genitourinary: No hernias.Testes nl. DRE - prostate nl for age - smooth & firm w/o nodules. Musculoskeletal: Full ROM all peripheral extremities, joint stability, 5/5 strength, and normal gait. Skin: Warm and dry without rashes, lesions, cyanosis, clubbing or  ecchymosis.  Neuro: Cranial nerves intact, reflexes equal bilaterally. Normal muscle tone, no cerebellar symptoms. Sensation intact.  Pysch: Awake and oriented X 3 with normal affect, insight and judgment appropriate.   Assessment and Plan  1. Essential hypertension   - Microalbumin / creatinine urine ratio - EKG 12-Lead - US, RETROPERITNL ABD,  LTD  2. Hyperlipidemia  - Lipid panel  3. Prediabetes  - Hemoglobin A1c - Insulin, random  4. Vitamin D deficiency  - Vit D  25 hydroxy   5. Prostate cancer  screening   6. Screening for rectal cancer  - POC Hemoccult Bld/Stl  - PSA  7. Other fatigue  - Vitamin B12 - Testosterone - Iron and TIBC - TSH  8. Medication management  - Urine Microscopic - CBC with Differential/Platelet - BASIC METABOLIC PANEL WITH GFR - Hepatic function panel - Magnesium  9. Morbid Obesity  (BMI 34.35) -   - Long discussion re: weight - diet - better eating habits and patient desires to try an appetite supressant   - Rx - Phentermine 37.5 mg #30 x 5 rf  - ROV 3 mo   Continue prudent diet as discussed, weight control, BP monitoring, regular exercise, and medications as discussed.  Discussed med effects and SE's. Routine screening labs and tests as requested with regular follow-up as recommended.  Over 40 minutes of exam, counseling &  chart review was performed

## 2014-07-31 NOTE — Patient Instructions (Signed)

## 2014-08-01 LAB — BASIC METABOLIC PANEL WITH GFR
BUN: 12 mg/dL (ref 6–23)
CO2: 21 mEq/L (ref 19–32)
Calcium: 8.7 mg/dL (ref 8.4–10.5)
Chloride: 104 mEq/L (ref 96–112)
Creat: 0.9 mg/dL (ref 0.50–1.35)
GFR, Est African American: >89 mL/min
GFR, Est Non African American: >89 mL/min
GLUCOSE: 97 mg/dL (ref 70–99)
POTASSIUM: 4.4 meq/L (ref 3.5–5.3)
Sodium: 136 mEq/L (ref 135–145)

## 2014-08-01 LAB — HEPATIC FUNCTION PANEL
ALK PHOS: 66 U/L (ref 39–117)
ALT: 26 U/L (ref 0–53)
AST: 20 U/L (ref 0–37)
Albumin: 4.5 g/dL (ref 3.5–5.2)
BILIRUBIN DIRECT: 0.1 mg/dL (ref 0.0–0.3)
BILIRUBIN INDIRECT: 0.6 mg/dL (ref 0.2–1.2)
BILIRUBIN TOTAL: 0.7 mg/dL (ref 0.2–1.2)
Total Protein: 7 g/dL (ref 6.0–8.3)

## 2014-08-01 LAB — MICROALBUMIN / CREATININE URINE RATIO
Creatinine, Urine: 99.3 mg/dL
Microalb Creat Ratio: 10.1 mg/g (ref 0.0–30.0)
Microalb, Ur: 1 mg/dL (ref <2.0)

## 2014-08-01 LAB — IRON AND TIBC
%SAT: 39 % (ref 20–55)
Iron: 118 ug/dL (ref 42–165)
TIBC: 303 ug/dL (ref 215–435)
UIBC: 185 ug/dL (ref 125–400)

## 2014-08-01 LAB — INSULIN, RANDOM: INSULIN: 24 u[IU]/mL — AB (ref 2.0–19.6)

## 2014-08-01 LAB — LIPID PANEL
CHOL/HDL RATIO: 4.7 ratio
CHOLESTEROL: 132 mg/dL (ref 0–200)
HDL: 28 mg/dL — AB (ref >=40)
LDL Cholesterol: 55 mg/dL (ref 0–99)
Triglycerides: 246 mg/dL — ABNORMAL HIGH (ref <150)
VLDL: 49 mg/dL — AB (ref 0–40)

## 2014-08-01 LAB — MAGNESIUM: Magnesium: 1.9 mg/dL (ref 1.5–2.5)

## 2014-08-01 LAB — URINALYSIS, MICROSCOPIC ONLY
BACTERIA UA: NONE SEEN
Casts: NONE SEEN
Crystals: NONE SEEN
Squamous Epithelial / LPF: NONE SEEN

## 2014-08-01 LAB — TESTOSTERONE: Testosterone: 320 ng/dL (ref 300–890)

## 2014-08-01 LAB — VITAMIN D 25 HYDROXY (VIT D DEFICIENCY, FRACTURES): Vit D, 25-Hydroxy: 23 ng/mL — ABNORMAL LOW (ref 30–100)

## 2014-08-01 LAB — PSA: PSA: 0.64 ng/mL (ref <=4.00)

## 2014-08-01 LAB — HEMOGLOBIN A1C
Hgb A1c MFr Bld: 5.9 % — ABNORMAL HIGH (ref <5.7)
Mean Plasma Glucose: 123 mg/dL — ABNORMAL HIGH (ref <117)

## 2014-08-01 LAB — TSH: TSH: 2.768 u[IU]/mL (ref 0.350–4.500)

## 2014-08-01 LAB — VITAMIN B12: VITAMIN B 12: 297 pg/mL (ref 211–911)

## 2014-08-06 LAB — TB SKIN TEST
Induration: 0 mm
TB SKIN TEST: NEGATIVE

## 2014-10-03 ENCOUNTER — Encounter: Payer: Self-pay | Admitting: Gastroenterology

## 2015-01-21 ENCOUNTER — Other Ambulatory Visit: Payer: Self-pay | Admitting: Internal Medicine

## 2015-02-02 ENCOUNTER — Encounter: Payer: Self-pay | Admitting: Internal Medicine

## 2015-02-02 ENCOUNTER — Ambulatory Visit (INDEPENDENT_AMBULATORY_CARE_PROVIDER_SITE_OTHER): Payer: BLUE CROSS/BLUE SHIELD | Admitting: Internal Medicine

## 2015-02-02 VITALS — BP 132/80 | HR 62 | Temp 98.1°F | Resp 16 | Ht 70.0 in | Wt 236.0 lb

## 2015-02-02 DIAGNOSIS — E785 Hyperlipidemia, unspecified: Secondary | ICD-10-CM

## 2015-02-02 DIAGNOSIS — J329 Chronic sinusitis, unspecified: Secondary | ICD-10-CM

## 2015-02-02 DIAGNOSIS — E559 Vitamin D deficiency, unspecified: Secondary | ICD-10-CM

## 2015-02-02 DIAGNOSIS — R7303 Prediabetes: Secondary | ICD-10-CM | POA: Diagnosis not present

## 2015-02-02 DIAGNOSIS — Z79899 Other long term (current) drug therapy: Secondary | ICD-10-CM

## 2015-02-02 DIAGNOSIS — I10 Essential (primary) hypertension: Secondary | ICD-10-CM

## 2015-02-02 DIAGNOSIS — M25562 Pain in left knee: Secondary | ICD-10-CM

## 2015-02-02 LAB — CBC WITH DIFFERENTIAL/PLATELET
BASOS PCT: 1 % (ref 0–1)
Basophils Absolute: 0.1 10*3/uL (ref 0.0–0.1)
EOS ABS: 0.1 10*3/uL (ref 0.0–0.7)
Eosinophils Relative: 1 % (ref 0–5)
HCT: 48.5 % (ref 39.0–52.0)
Hemoglobin: 16.8 g/dL (ref 13.0–17.0)
LYMPHS ABS: 2.2 10*3/uL (ref 0.7–4.0)
Lymphocytes Relative: 30 % (ref 12–46)
MCH: 29.6 pg (ref 26.0–34.0)
MCHC: 34.6 g/dL (ref 30.0–36.0)
MCV: 85.4 fL (ref 78.0–100.0)
MONO ABS: 0.6 10*3/uL (ref 0.1–1.0)
MONOS PCT: 8 % (ref 3–12)
MPV: 9.2 fL (ref 8.6–12.4)
NEUTROS PCT: 60 % (ref 43–77)
Neutro Abs: 4.3 10*3/uL (ref 1.7–7.7)
Platelets: 181 10*3/uL (ref 150–400)
RBC: 5.68 MIL/uL (ref 4.22–5.81)
RDW: 13.9 % (ref 11.5–15.5)
WBC: 7.2 10*3/uL (ref 4.0–10.5)

## 2015-02-02 LAB — HEPATIC FUNCTION PANEL
ALBUMIN: 4.5 g/dL (ref 3.6–5.1)
ALT: 24 U/L (ref 9–46)
AST: 20 U/L (ref 10–35)
Alkaline Phosphatase: 67 U/L (ref 40–115)
BILIRUBIN TOTAL: 0.7 mg/dL (ref 0.2–1.2)
Bilirubin, Direct: 0.1 mg/dL (ref <=0.2)
Indirect Bilirubin: 0.6 mg/dL (ref 0.2–1.2)
Total Protein: 7.6 g/dL (ref 6.1–8.1)

## 2015-02-02 LAB — BASIC METABOLIC PANEL WITH GFR
BUN: 21 mg/dL (ref 7–25)
CALCIUM: 9.1 mg/dL (ref 8.6–10.3)
CHLORIDE: 104 mmol/L (ref 98–110)
CO2: 24 mmol/L (ref 20–31)
CREATININE: 0.97 mg/dL (ref 0.70–1.33)
GFR, Est African American: >89 mL/min (ref >=60)
GFR, Est Non African American: 85 mL/min (ref >=60)
Glucose, Bld: 86 mg/dL (ref 65–99)
Potassium: 4.5 mmol/L (ref 3.5–5.3)
SODIUM: 137 mmol/L (ref 135–146)

## 2015-02-02 LAB — LIPID PANEL
Cholesterol: 170 mg/dL (ref 125–200)
HDL: 28 mg/dL — ABNORMAL LOW (ref >=40)
Total CHOL/HDL Ratio: 6.1 Ratio — ABNORMAL HIGH (ref <=5.0)
Triglycerides: 462 mg/dL — ABNORMAL HIGH (ref <150)

## 2015-02-02 LAB — TSH: TSH: 1.598 u[IU]/mL (ref 0.350–4.500)

## 2015-02-02 LAB — HEMOGLOBIN A1C
HEMOGLOBIN A1C: 5.9 % — AB (ref <5.7)
MEAN PLASMA GLUCOSE: 123 mg/dL — AB (ref <117)

## 2015-02-02 MED ORDER — AZITHROMYCIN 250 MG PO TABS: ORAL_TABLET | ORAL | Status: DC

## 2015-02-02 MED ORDER — PREDNISONE 20 MG PO TABS: ORAL_TABLET | ORAL | Status: DC

## 2015-02-02 NOTE — Progress Notes (Signed)
Patient ID: ROSHAWN AYALA, male   DOB: 07-31-1955, 59 y.o.   MRN: [id number]  Assessment and Plan:  Hypertension:  -Continue medication,  -monitor blood pressure at home.  -Continue DASH diet.   -Reminder to go to the ER if any CP, SOB, nausea, dizziness, severe HA, changes vision/speech, left arm numbness and tingling, and jaw pain.  Cholesterol: -Continue diet and exercise.  -Check cholesterol.   Pre-diabetes: -Continue diet and exercise.  -Check A1C  Vitamin D Def: -check level -continue medications.   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 Further disposition pending results of labs.  HPI 59 y.o. male  presents for 3 month follow up with hypertension, hyperlipidemia, prediabetes and vitamin D.   His blood pressure has been controlled at home, today their BP is BP: 132/80 mmHg.   He does not workout. He denies chest pain, shortness of breath, dizziness.   He is on cholesterol medication and denies myalgias. His cholesterol is at goal. The cholesterol last visit was:   Lab Results  Component Value Date   CHOL 132 07/31/2014   HDL 28* 07/31/2014   LDLCALC 55 07/31/2014   TRIG 246* 07/31/2014   CHOLHDL 4.7 07/31/2014     He has been working on diet and exercise for prediabetes, and denies foot ulcerations, hyperglycemia, hypoglycemia , increased appetite, nausea, paresthesia of the feet, polydipsia, polyuria, visual disturbances, vomiting and weight loss. Last A1C in the office was:  Lab Results  Component Value Date   HGBA1C 5.9* 07/31/2014    Patient is on Vitamin D supplement.  Lab Results  Component Value Date   VD25OH 23* 07/31/2014     He reports that for the past 3 weeks he has been having a lot coughing and is mostly dry.  He  occasionally gets some green stuff up.  He reports that the coughing is worse at nighttime.  He reports that he has a lot of sinus congestion and pressure.  He has green nasal sputum as well. He reports that he has been taking dayquil. He reports no help.  He does not have seasonal allergies per his report.  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  He has continued to do his PT at home but it is aching and very sore and he has a lot of limited mobility secondary to it.     Current Medications:  Current Outpatient Prescriptions on File Prior to Visit  Medication Sig Dispense Refill  . bisoprolol-hydrochlorothiazide (ZIAC) 10-6.25 MG per tablet TAKE 1 TABLET EVERY DAY FOR BLOOD PRESSURE 90 tablet 3  . lisinopril (PRINIVIL,ZESTRIL) 40 MG tablet TAKE 1/2 TO 1 TABLET DAILY AS DIRECTED FOR BP AND KIDNEY PROTECTION 90 tablet 1  . pseudoephedrine-guaifenesin (MUCINEX D) 60-600 MG per tablet Take by mouth.    . sildenafil (REVATIO) 20 MG tablet Take one to five tablets daily as needed 30 tablet prn   No current facility-administered medications on file prior to visit.    Medical History:  Past Medical History  Diagnosis Date  . Hyperlipidemia   . Hypertension   . Vitamin D deficiency     Allergies: No Known Allergies   Review of Systems:  Review of Systems  Constitutional: Negative  for fever, chills and malaise/fatigue.  HENT: Positive for congestion. Negative for ear pain and sore throat.   Eyes: Negative.   Respiratory: Positive for cough and shortness of breath. Negative for wheezing.   Cardiovascular: Negative for chest pain, palpitations and leg swelling.  Gastrointestinal: Negative for heartburn, nausea, diarrhea, constipation, blood in stool and melena.  Genitourinary: Negative.   Skin: Negative.   Neurological: Negative for dizziness, sensory change, loss of consciousness and headaches.   Psychiatric/Behavioral: Negative for depression. The patient is not nervous/anxious and does not have insomnia.     Family history- Review and unchanged  Social history- Review and unchanged  Physical Exam: BP 132/80 mmHg  Pulse 62  Temp(Src) 98.1 F (36.7 C) (Temporal)  Resp 16  Ht 5\' 10"  (1.778 m)  Wt 236 lb (107.049 kg)  BMI 33.86 kg/m2  SpO2 98% Wt Readings from Last 3 Encounters:  02/02/15 236 lb (107.049 kg)  07/31/14 239 lb 6.4 oz (108.591 kg)  07/10/14 239 lb (108.41 kg)    General Appearance: Well nourished well developed, in no apparent distress. Eyes: PERRLA, EOMs, conjunctiva no swelling or erythema ENT/Mouth: Ear canals normal without obstruction, swelling, erythma, discharge.  TMs normal bilaterally.  Oropharynx moist, clear, without exudate, or postoropharyngeal swelling. Neck: Supple, thyroid normal,no cervical adenopathy  Respiratory: Respiratory effort normal, Breath sounds clear A&P without rhonchi, wheeze, or rale.  No retractions, no accessory usage. Cardio: RRR with no MRGs. Brisk peripheral pulses without edema.  Abdomen: Soft, + BS,  Non tender, no guarding, rebound, hernias, masses. Musculoskeletal: Full ROM, 5/5 strength, antalgic gait to the left.  Palpable tension of the left VMO and anterior quad.  Skin: Warm, dry without rashes, lesions, ecchymosis.  Neuro: Awake and oriented X 3, Cranial nerves intact. Normal muscle tone, no cerebellar symptoms. Psych: Normal affect, Insight and Judgment appropriate.    Terri Piedraourtney Forcucci, PA-C 8:57 AM [facility]

## 2015-05-28 ENCOUNTER — Other Ambulatory Visit: Payer: Self-pay | Admitting: Internal Medicine

## 2015-05-28 DIAGNOSIS — M25569 Pain in unspecified knee: Secondary | ICD-10-CM

## 2015-08-04 ENCOUNTER — Other Ambulatory Visit: Payer: Self-pay | Admitting: Internal Medicine

## 2015-08-14 ENCOUNTER — Encounter: Payer: Self-pay | Admitting: Internal Medicine

## 2016-08-05 ENCOUNTER — Other Ambulatory Visit: Payer: Self-pay | Admitting: Internal Medicine

## 2016-08-15 ENCOUNTER — Ambulatory Visit (INDEPENDENT_AMBULATORY_CARE_PROVIDER_SITE_OTHER): Payer: BLUE CROSS/BLUE SHIELD | Admitting: Physician Assistant

## 2016-08-15 ENCOUNTER — Other Ambulatory Visit: Payer: Self-pay | Admitting: *Deleted

## 2016-08-15 VITALS — BP 144/98 | HR 80 | Temp 97.3°F | Resp 16 | Ht 70.0 in | Wt 233.6 lb

## 2016-08-15 DIAGNOSIS — R7303 Prediabetes: Secondary | ICD-10-CM | POA: Diagnosis not present

## 2016-08-15 DIAGNOSIS — E785 Hyperlipidemia, unspecified: Secondary | ICD-10-CM | POA: Diagnosis not present

## 2016-08-15 DIAGNOSIS — E559 Vitamin D deficiency, unspecified: Secondary | ICD-10-CM | POA: Diagnosis not present

## 2016-08-15 DIAGNOSIS — W57XXXA Bitten or stung by nonvenomous insect and other nonvenomous arthropods, initial encounter: Secondary | ICD-10-CM

## 2016-08-15 DIAGNOSIS — Z79899 Other long term (current) drug therapy: Secondary | ICD-10-CM

## 2016-08-15 DIAGNOSIS — I10 Essential (primary) hypertension: Secondary | ICD-10-CM

## 2016-08-15 LAB — CBC WITH DIFFERENTIAL/PLATELET
Basophils Absolute: 0 cells/uL (ref 0–200)
Basophils Relative: 0 %
EOS ABS: 120 {cells}/uL (ref 15–500)
Eosinophils Relative: 2 %
HEMATOCRIT: 47.1 % (ref 38.5–50.0)
Hemoglobin: 15.7 g/dL (ref 13.2–17.1)
LYMPHS PCT: 28 %
Lymphs Abs: 1680 cells/uL (ref 850–3900)
MCH: 28.6 pg (ref 27.0–33.0)
MCHC: 33.3 g/dL (ref 32.0–36.0)
MCV: 85.8 fL (ref 80.0–100.0)
MONO ABS: 480 {cells}/uL (ref 200–950)
MPV: 9.4 fL (ref 7.5–12.5)
Monocytes Relative: 8 %
NEUTROS ABS: 3720 {cells}/uL (ref 1500–7800)
Neutrophils Relative %: 62 %
PLATELETS: 163 10*3/uL (ref 140–400)
RBC: 5.49 MIL/uL (ref 4.20–5.80)
RDW: 14.1 % (ref 11.0–15.0)
WBC: 6 10*3/uL (ref 3.8–10.8)

## 2016-08-15 LAB — TSH: TSH: 2.84 m[IU]/L (ref 0.40–4.50)

## 2016-08-15 MED ORDER — LISINOPRIL 40 MG PO TABS: ORAL_TABLET | ORAL | 0 refills | Status: DC

## 2016-08-15 MED ORDER — SILDENAFIL CITRATE 20 MG PO TABS: ORAL_TABLET | ORAL | 1 refills | Status: DC

## 2016-08-15 MED ORDER — BISOPROLOL-HYDROCHLOROTHIAZIDE 10-6.25 MG PO TABS: ORAL_TABLET | ORAL | 0 refills | Status: DC

## 2016-08-15 NOTE — Patient Instructions (Signed)
We want weight loss that will last so you should lose 1-2 pounds a week.  THAT IS IT! Please pick THREE things a month to change. Once it is a habit check off the item. Then pick another three items off the list to become habits.  If you are already doing a habit on the list GREAT!  Cross that item off! o Don't drink your calories. Ie, alcohol, soda, fruit juice, and sweet tea.  o Drink more water. Drink a glass when you feel hungry or before each meal.  o Eat breakfast - Complex carb and protein (likeDannon light and fit yogurt, oatmeal, fruit, eggs, [place] bacon). o Measure your cereal.  Eat no more than one cup a day. (ie [place]) o Eat an apple a day. o Add a vegetable a day. o Try a new vegetable a month. o Use Pam! Stop using oil or butter to cook. o Don't finish your plate or use smaller plates. o Share your dessert. o Eat sugar free Jello for dessert or frozen grapes. o Don't eat 2-3 hours before bed. o Switch to whole wheat bread, pasta, and brown rice. o Make healthier choices when you eat out. No fries! o Pick baked chicken, NOT fried. o Don't forget to SLOW DOWN when you eat. It is not going anywhere.  o Take the stairs. o Park far away in the parking lot o [employer] (or weights) for 10 minutes while watching TV. o Walk at work for 10 minutes during break. o Walk outside 1 time a week with your friend, kids, dog, or significant other. o Start a walking group at [place] the mall as much as you can tolerate.  o Keep a food diary. o Weigh yourself daily. o Walk for 15 minutes 3 days per week. o Cook at home more often and eat out less.  If life happens and you go back to old habits, it is okay.  Just start over. You can do it!   If you experience chest pain, get short of breath, or tired during the exercise, please stop immediately and inform your doctor.    Simple math prevails.    1st - exercise does not produce significant weight loss - at best one converts fat  into muscle , "bulks up", loses inches, but usually stays "weight neutral"     2nd - think of your body weightas a check book: If you eat more calories than you burn up - you save money or gain weight .... Or if you spend more money than you put in the check book, ie burn up more calories than you eat, then you lose weight     3rd - if you walk or run 1 mile, you burn up 100 calories - you have to burn up 3,500 calories to lose 1 pound, ie you have to walk/run 35 miles to lose 1 measly pound. So if you want to lose 10 #, then you have to walk/run 350 miles, so.... clearly exercise is not the solution.     4. So if you consume 1,500 calories, then you have to burn up the equivalent of 15 miles to stay weight neutral - It also stands to reason that if you consume 1,500 cal/day and don't lose weight, then you must be burning up about 1,500 cals/day to stay weight neutral.     5. If you really want to lose weight, you must cut your calorie intake 300 calories /day and  at that rate you should lose about 1 # every 3 days.   6. Please purchase Dr Francis DowseJoel Fuhrman's book(s) "The End of Dieting" & "Eat to Live" . It has some great concepts and recipes.      Triglycerides are simple sugars in blood that are converted into a storage form. I recommend you avoid fried/greasy foods, sweets/candy, white rice , white potatoes,  anything made from white flour, sweet tea, soda, fruit juices and avoid alcohol in excess. Sweet potatoes, brown/wild rice/Quinoa, Vegetarian, spinach, or wheat pasta, Multi-grain bread - like multi-grain flat bread or sandwich thins are okay.

## 2016-08-15 NOTE — Progress Notes (Signed)
Patient ID: Anthony Boone, male   DOB: 08/15/1955, 61 y.o.   MRN: [id number]  Assessment and Plan:  Hypertension:  -Continue medication,  -monitor blood pressure at home.  -Continue DASH diet.   -Reminder to go to the ER if any CP, SOB, nausea, dizziness, severe HA, changes vision/speech, left arm numbness and tingling, and jaw pain.  Cholesterol: -Continue diet and exercise.  -Check cholesterol.   Pre-diabetes: -Continue diet and exercise.  -Check A1C  Vitamin D Def: -check level -continue medications.   Morbid Obesity with co morbidities - long discussion about weight loss, diet, and exercise  Bug bite No evidence of infection, mild erythema but no warmth, pus, streaking,  Hydrocortisone, if worse call office.   Continue diet and meds as discussed. Further disposition pending results of labs.  HPI 61 y.o. male  presents for 3 month follow up with hypertension, hyperlipidemia, prediabetes and vitamin D.   His blood pressure has not been controlled at home, off BP meds x 4-5 weeks, today their BP is BP: (!) 144/98.   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 He denies chest pain, shortness of breath, dizziness. He had a bite on his arm Friday or sat , right arm. Unknown what bit him, no fever, chills, some redness at the site. No rash anywhere else.    He is on cholesterol medication and denies myalgias. His cholesterol is at goal. The cholesterol last visit was:   Lab Results  Component Value Date   CHOL 170 02/02/2015   HDL 28 (L) 02/02/2015   LDLCALC NOT CALC 02/02/2015   TRIG 462 (H) 02/02/2015   CHOLHDL 6.1 (H) 02/02/2015     He has been working on diet and exercise for prediabetes, and denies foot ulcerations, hyperglycemia, hypoglycemia , increased appetite, nausea, paresthesia of the feet, polydipsia, polyuria, visual disturbances, vomiting and weight loss. Last A1C in the office was:  Lab  Results  Component Value Date   HGBA1C 5.9 (H) 02/02/2015    Patient is on Vitamin D supplement.  Lab Results  Component Value Date   VD25OH 23 (L) 07/31/2014     BMI is Body mass index is 33.52 kg/m., he is working on diet and exercise. Wt Readings from Last 3 Encounters:  08/15/16 233 lb 9.6 oz (106 kg)  02/02/15 236 lb (107 kg)  07/31/14 239 lb 6.4 oz (108.6 kg)    Current Medications:  No current outpatient prescriptions on file prior to visit.   No current facility-administered medications on file prior to visit.     Medical History:  Past Medical History:  Diagnosis Date  . Hyperlipidemia   . Hypertension   . Vitamin D deficiency     Allergies: No Known Allergies   Review of Systems:  Review of Systems  Constitutional: Negative for chills, fever and malaise/fatigue.  HENT: Negative for congestion, ear pain and sore throat.   Eyes: Negative.   Respiratory: Negative for cough, shortness of breath and wheezing.   Cardiovascular: Negative for chest pain, palpitations and leg swelling.  Gastrointestinal: Negative for blood in stool, constipation, diarrhea, heartburn, melena and nausea.  Genitourinary: Negative.   Skin: Negative.   Neurological: Negative for dizziness, sensory change, loss of consciousness and headaches.  Psychiatric/Behavioral: Negative for depression. The patient is not nervous/anxious and does not have insomnia.     Family history- Review and unchanged  Social history- Review and unchanged  Physical Exam:  BP (!) 144/98   Pulse 80   Temp 97.3 F (36.3 C)   Resp 16   Ht 5\' 10"  (1.778 m)   Wt 233 lb 9.6 oz (106 kg)   BMI 33.52 kg/m  Wt Readings from Last 3 Encounters:  08/15/16 233 lb 9.6 oz (106 kg)  02/02/15 236 lb (107 kg)  07/31/14 239 lb 6.4 oz (108.6 kg)    General Appearance: Well nourished well developed, in no apparent distress. Eyes: PERRLA, EOMs, conjunctiva no swelling or erythema ENT/Mouth: Ear canals normal without  obstruction, swelling, erythma, discharge.  TMs normal bilaterally.  Oropharynx moist, clear, without exudate, or postoropharyngeal swelling. Neck: Supple, thyroid normal,no cervical adenopathy  Respiratory: Respiratory effort normal, Breath sounds clear A&P without rhonchi, wheeze, or rale.  No retractions, no accessory usage. Cardio: RRR with no MRGs. Brisk peripheral pulses without edema.  Abdomen: Soft, + BS,  Non tender, no guarding, rebound, hernias, masses. Musculoskeletal: Full ROM, 5/5 strength, antalgic gait, right knee wrapped Skin: right posterior proximal arm with erythematous 0.5cm welp, no warmth, streaking, pus. Warm, dry without rashes, lesions, ecchymosis.  Neuro: Awake and oriented X 3, Cranial nerves intact. Normal muscle tone, no cerebellar symptoms. Psych: Normal affect, Insight and Judgment appropriate.    Quentin Mulling, PA-C 12:39 PM [facility]

## 2016-08-16 LAB — LIPID PANEL
CHOL/HDL RATIO: 3.9 ratio (ref <5.0)
Cholesterol: 156 mg/dL (ref <200)
HDL: 40 mg/dL — ABNORMAL LOW (ref >40)
LDL Cholesterol: 83 mg/dL (ref <100)
Triglycerides: 164 mg/dL — ABNORMAL HIGH (ref <150)
VLDL: 33 mg/dL — ABNORMAL HIGH (ref <30)

## 2016-08-16 LAB — BASIC METABOLIC PANEL WITH GFR
BUN: 11 mg/dL (ref 7–25)
CHLORIDE: 104 mmol/L (ref 98–110)
CO2: 23 mmol/L (ref 20–31)
Calcium: 9.3 mg/dL (ref 8.6–10.3)
Creat: 0.97 mg/dL (ref 0.70–1.25)
GFR, EST NON AFRICAN AMERICAN: 84 mL/min (ref >=60)
GLUCOSE: 79 mg/dL (ref 65–99)
Potassium: 4.2 mmol/L (ref 3.5–5.3)
Sodium: 138 mmol/L (ref 135–146)

## 2016-08-16 LAB — HEPATIC FUNCTION PANEL
ALK PHOS: 78 U/L (ref 40–115)
ALT: 24 U/L (ref 9–46)
AST: 17 U/L (ref 10–35)
Albumin: 4.7 g/dL (ref 3.6–5.1)
BILIRUBIN INDIRECT: 0.5 mg/dL (ref 0.2–1.2)
BILIRUBIN TOTAL: 0.6 mg/dL (ref 0.2–1.2)
Bilirubin, Direct: 0.1 mg/dL (ref <=0.2)
Total Protein: 7.6 g/dL (ref 6.1–8.1)

## 2016-08-16 LAB — HEMOGLOBIN A1C
Hgb A1c MFr Bld: 5.4 % (ref <5.7)
MEAN PLASMA GLUCOSE: 108 mg/dL

## 2016-08-16 LAB — MAGNESIUM: Magnesium: 2 mg/dL (ref 1.5–2.5)

## 2016-08-16 LAB — VITAMIN D 25 HYDROXY (VIT D DEFICIENCY, FRACTURES): Vit D, 25-Hydroxy: 24 ng/mL — ABNORMAL LOW (ref 30–100)

## 2016-08-16 NOTE — Progress Notes (Signed)
LVM for pt to return office call for LAB results.

## 2016-08-17 NOTE — Progress Notes (Signed)
Pt aware of lab results & voiced understanding of those results.

## 2016-09-07 ENCOUNTER — Encounter: Payer: Self-pay | Admitting: Internal Medicine

## 2016-09-27 DIAGNOSIS — M25661 Stiffness of right knee, not elsewhere classified: Secondary | ICD-10-CM | POA: Diagnosis not present

## 2016-09-27 DIAGNOSIS — R29898 Other symptoms and signs involving the musculoskeletal system: Secondary | ICD-10-CM | POA: Diagnosis not present

## 2016-09-27 DIAGNOSIS — M25561 Pain in right knee: Secondary | ICD-10-CM | POA: Diagnosis not present

## 2016-09-30 DIAGNOSIS — M25661 Stiffness of right knee, not elsewhere classified: Secondary | ICD-10-CM | POA: Diagnosis not present

## 2016-09-30 DIAGNOSIS — M25561 Pain in right knee: Secondary | ICD-10-CM | POA: Diagnosis not present

## 2016-09-30 DIAGNOSIS — R29898 Other symptoms and signs involving the musculoskeletal system: Secondary | ICD-10-CM | POA: Diagnosis not present

## 2016-10-04 DIAGNOSIS — M25561 Pain in right knee: Secondary | ICD-10-CM | POA: Diagnosis not present

## 2016-10-04 DIAGNOSIS — R29898 Other symptoms and signs involving the musculoskeletal system: Secondary | ICD-10-CM | POA: Diagnosis not present

## 2016-10-04 DIAGNOSIS — M25661 Stiffness of right knee, not elsewhere classified: Secondary | ICD-10-CM | POA: Diagnosis not present

## 2016-10-06 DIAGNOSIS — R29898 Other symptoms and signs involving the musculoskeletal system: Secondary | ICD-10-CM | POA: Diagnosis not present

## 2016-10-06 DIAGNOSIS — M25561 Pain in right knee: Secondary | ICD-10-CM | POA: Diagnosis not present

## 2016-10-06 DIAGNOSIS — M25661 Stiffness of right knee, not elsewhere classified: Secondary | ICD-10-CM | POA: Diagnosis not present

## 2016-10-07 DIAGNOSIS — B029 Zoster without complications: Secondary | ICD-10-CM | POA: Diagnosis not present

## 2016-10-07 DIAGNOSIS — L821 Other seborrheic keratosis: Secondary | ICD-10-CM | POA: Diagnosis not present

## 2016-10-18 DIAGNOSIS — M25561 Pain in right knee: Secondary | ICD-10-CM | POA: Diagnosis not present

## 2016-10-18 DIAGNOSIS — R29898 Other symptoms and signs involving the musculoskeletal system: Secondary | ICD-10-CM | POA: Diagnosis not present

## 2016-10-18 DIAGNOSIS — M25661 Stiffness of right knee, not elsewhere classified: Secondary | ICD-10-CM | POA: Diagnosis not present

## 2016-11-01 DIAGNOSIS — M25661 Stiffness of right knee, not elsewhere classified: Secondary | ICD-10-CM | POA: Diagnosis not present

## 2016-11-01 DIAGNOSIS — R29898 Other symptoms and signs involving the musculoskeletal system: Secondary | ICD-10-CM | POA: Diagnosis not present

## 2016-11-01 DIAGNOSIS — M25561 Pain in right knee: Secondary | ICD-10-CM | POA: Diagnosis not present

## 2016-11-04 DIAGNOSIS — R21 Rash and other nonspecific skin eruption: Secondary | ICD-10-CM | POA: Diagnosis not present

## 2016-11-04 DIAGNOSIS — Z8619 Personal history of other infectious and parasitic diseases: Secondary | ICD-10-CM | POA: Diagnosis not present

## 2016-11-07 DIAGNOSIS — M25561 Pain in right knee: Secondary | ICD-10-CM | POA: Diagnosis not present

## 2016-11-07 DIAGNOSIS — R29898 Other symptoms and signs involving the musculoskeletal system: Secondary | ICD-10-CM | POA: Diagnosis not present

## 2016-11-07 DIAGNOSIS — M25661 Stiffness of right knee, not elsewhere classified: Secondary | ICD-10-CM | POA: Diagnosis not present

## 2016-11-11 DIAGNOSIS — R29898 Other symptoms and signs involving the musculoskeletal system: Secondary | ICD-10-CM | POA: Diagnosis not present

## 2016-11-11 DIAGNOSIS — M25561 Pain in right knee: Secondary | ICD-10-CM | POA: Diagnosis not present

## 2016-11-11 DIAGNOSIS — M25661 Stiffness of right knee, not elsewhere classified: Secondary | ICD-10-CM | POA: Diagnosis not present

## 2016-11-14 ENCOUNTER — Other Ambulatory Visit: Payer: Self-pay | Admitting: Internal Medicine

## 2016-11-18 DIAGNOSIS — R29898 Other symptoms and signs involving the musculoskeletal system: Secondary | ICD-10-CM | POA: Diagnosis not present

## 2016-11-18 DIAGNOSIS — M25661 Stiffness of right knee, not elsewhere classified: Secondary | ICD-10-CM | POA: Diagnosis not present

## 2016-11-18 DIAGNOSIS — M25561 Pain in right knee: Secondary | ICD-10-CM | POA: Diagnosis not present

## 2016-11-22 DIAGNOSIS — M25561 Pain in right knee: Secondary | ICD-10-CM | POA: Diagnosis not present

## 2016-11-22 DIAGNOSIS — M25661 Stiffness of right knee, not elsewhere classified: Secondary | ICD-10-CM | POA: Diagnosis not present

## 2016-11-22 DIAGNOSIS — R29898 Other symptoms and signs involving the musculoskeletal system: Secondary | ICD-10-CM | POA: Diagnosis not present

## 2016-11-24 DIAGNOSIS — M25561 Pain in right knee: Secondary | ICD-10-CM | POA: Diagnosis not present

## 2016-11-24 DIAGNOSIS — M25661 Stiffness of right knee, not elsewhere classified: Secondary | ICD-10-CM | POA: Diagnosis not present

## 2016-11-24 DIAGNOSIS — R29898 Other symptoms and signs involving the musculoskeletal system: Secondary | ICD-10-CM | POA: Diagnosis not present

## 2016-11-29 DIAGNOSIS — R29898 Other symptoms and signs involving the musculoskeletal system: Secondary | ICD-10-CM | POA: Diagnosis not present

## 2016-11-29 DIAGNOSIS — M25561 Pain in right knee: Secondary | ICD-10-CM | POA: Diagnosis not present

## 2016-11-29 DIAGNOSIS — M25661 Stiffness of right knee, not elsewhere classified: Secondary | ICD-10-CM | POA: Diagnosis not present

## 2016-12-02 DIAGNOSIS — M25561 Pain in right knee: Secondary | ICD-10-CM | POA: Diagnosis not present

## 2016-12-02 DIAGNOSIS — M25661 Stiffness of right knee, not elsewhere classified: Secondary | ICD-10-CM | POA: Diagnosis not present

## 2016-12-02 DIAGNOSIS — R29898 Other symptoms and signs involving the musculoskeletal system: Secondary | ICD-10-CM | POA: Diagnosis not present

## 2017-02-17 ENCOUNTER — Encounter: Payer: Self-pay | Admitting: Internal Medicine

## 2017-02-18 ENCOUNTER — Other Ambulatory Visit: Payer: Self-pay | Admitting: Internal Medicine

## 2017-02-18 MED ORDER — LISINOPRIL 40 MG PO TABS: 40.0000 mg | ORAL_TABLET | Freq: Every day | ORAL | 0 refills | Status: DC

## 2017-07-04 ENCOUNTER — Ambulatory Visit (INDEPENDENT_AMBULATORY_CARE_PROVIDER_SITE_OTHER): Payer: Medicare Other | Admitting: Internal Medicine

## 2017-07-04 ENCOUNTER — Encounter: Payer: Self-pay | Admitting: Internal Medicine

## 2017-07-04 ENCOUNTER — Other Ambulatory Visit: Payer: Self-pay | Admitting: *Deleted

## 2017-07-04 VITALS — BP 124/78 | HR 68 | Temp 97.1°F | Resp 18 | Ht 70.0 in | Wt 224.2 lb

## 2017-07-04 DIAGNOSIS — Z79899 Other long term (current) drug therapy: Secondary | ICD-10-CM

## 2017-07-04 DIAGNOSIS — E782 Mixed hyperlipidemia: Secondary | ICD-10-CM | POA: Diagnosis not present

## 2017-07-04 DIAGNOSIS — E559 Vitamin D deficiency, unspecified: Secondary | ICD-10-CM

## 2017-07-04 DIAGNOSIS — J40 Bronchitis, not specified as acute or chronic: Secondary | ICD-10-CM | POA: Diagnosis not present

## 2017-07-04 DIAGNOSIS — R7303 Prediabetes: Secondary | ICD-10-CM

## 2017-07-04 DIAGNOSIS — I10 Essential (primary) hypertension: Secondary | ICD-10-CM | POA: Diagnosis not present

## 2017-07-04 MED ORDER — PREDNISONE 20 MG PO TABS: ORAL_TABLET | ORAL | 0 refills | Status: DC

## 2017-07-04 MED ORDER — AZITHROMYCIN 250 MG PO TABS: ORAL_TABLET | ORAL | 1 refills | Status: DC

## 2017-07-04 MED ORDER — BISOPROLOL-HYDROCHLOROTHIAZIDE 10-6.25 MG PO TABS: 1.0000 | ORAL_TABLET | Freq: Every day | ORAL | 0 refills | Status: DC

## 2017-07-04 MED ORDER — LISINOPRIL 40 MG PO TABS: 40.0000 mg | ORAL_TABLET | Freq: Every day | ORAL | 0 refills | Status: DC

## 2017-07-04 NOTE — Progress Notes (Signed)
This very nice 62 y.o.  MWM presents for very belated  follow up with HTN, HLD, Pre-Diabetes and Vitamin D Deficiency.      Patient is treated for HTN & BP has been controlled at home. Today's BP is at goal - 124/78. Patient has had no complaints of any cardiac type chest pain, palpitations, dyspnea / orthopnea / PND, dizziness, claudication, or dependent edema.     Hyperlipidemia is controlled with diet & meds. Patient denies myalgias or other med SE's. Last Lipids  10 months ago were at goal albeit sl elevated Trig's: Lab Results  Component Value Date   CHOL 156 08/15/2016   HDL 40 (L) 08/15/2016   LDLCALC 83 08/15/2016   TRIG 164 (H) 08/15/2016   CHOLHDL 3.9 08/15/2016      Also, the patient has history of PreDiabetes and has had no symptoms of reactive hypoglycemia, diabetic polys, paresthesias or visual blurring.  Last A1c was at goal: Lab Results  Component Value Date   HGBA1C 5.4 08/15/2016      Further, the patient also has history of Vitamin D Deficiency ("24"/2012) and does not supplement as recommended. Last vitamin D was still low: Lab Results  Component Value Date   VD25OH 24 (L) 08/15/2016   Current Outpatient Medications on File Prior to Visit  Medication Sig  . bisoprolol-hydrochlorothiazide (ZIAC) 10-6.25 MG tablet TAKE 1 TABLET BY MOUTH EVERY DAY  . lisinopril (PRINIVIL,ZESTRIL) 40 MG tablet Take 1 tablet (40 mg total) by mouth daily.  . sildenafil (REVATIO) 20 MG tablet Take one to five tablets daily as needed   No current facility-administered medications on file prior to visit.    No Known Allergies   PMHx:   Past Medical History:  Diagnosis Date  . Hyperlipidemia   . Hypertension   . Vitamin D deficiency    Immunization History  Administered Date(s) Administered  . PPD Test 07/31/2014  . Td 11/12/2001  . Tdap 03/15/2011   Past Surgical History:  Procedure Laterality Date  . APPENDECTOMY  1974  . VASECTOMY  2005   Dr Edwin Capuckett   FHx:     Reviewed / unchanged  SHx:    Reviewed / unchanged  Systems Review:  Constitutional: Denies fever, chills, wt changes, headaches, insomnia, fatigue, night sweats, change in appetite. Eyes: Denies redness, blurred vision, diplopia, discharge, itchy, watery eyes.  ENT: Denies discharge, congestion, post nasal drip, epistaxis, sore throat, earache, hearing loss, dental pain, tinnitus, vertigo, sinus pain, snoring.  CV: Denies chest pain, palpitations, irregular heartbeat, syncope, dyspnea, diaphoresis, orthopnea, PND, claudication or edema. Respiratory: denies cough, dyspnea, DOE, pleurisy, hoarseness, laryngitis, wheezing.  Gastrointestinal: Denies dysphagia, odynophagia, heartburn, reflux, water brash, abdominal pain or cramps, nausea, vomiting, bloating, diarrhea, constipation, hematemesis, melena, hematochezia  or hemorrhoids. Genitourinary: Denies dysuria, frequency, urgency, nocturia, hesitancy, discharge, hematuria or flank pain. Musculoskeletal: Denies arthralgias, myalgias, stiffness, jt. swelling, pain, limping or strain/sprain.  Skin: Denies pruritus, rash, hives, warts, acne, eczema or change in skin lesion(s). Neuro: No weakness, tremor, incoordination, spasms, paresthesia or pain. Psychiatric: Denies confusion, memory loss or sensory loss. Endo: Denies change in weight, skin or hair change.  Heme/Lymph: No excessive bleeding, bruising or enlarged lymph nodes.  Physical Exam  BP 124/78   Pulse 68   Temp (!) 97.1 F (36.2 C)   Resp 18   Ht 5\' 10"  (1.778 m)   Wt 224 lb 3.2 oz (101.7 kg)   BMI 32.17 kg/m   Appears  well  nourished, well groomed  and in no distress.  Eyes: PERRLA, EOMs, conjunctiva no swelling or erythema. Sinuses: No frontal/maxillary tenderness ENT/Mouth: EAC's clear, TM's nl w/o erythema, bulging. Nares clear w/o erythema, swelling, exudates. Oropharynx clear without erythema or exudates. Oral hygiene is good. Tongue normal, non obstructing. Hearing intact.   Neck: Supple. Thyroid not palpable. Car 2+/2+ without bruits, nodes or JVD. Chest: Respirations nl with BS clear & equal w/o rales, rhonchi, wheezing or stridor.  Cor: Heart sounds normal w/ regular rate and rhythm without sig. murmurs, gallops, clicks or rubs. Peripheral pulses normal and equal  without edema.  Abdomen: Soft & bowel sounds normal. Non-tender w/o guarding, rebound, hernias, masses or organomegaly.  Lymphatics: Unremarkable.  Musculoskeletal: Full ROM all peripheral extremities, joint stability, 5/5 strength and normal gait.  Skin: Warm, dry without exposed rashes, lesions or ecchymosis apparent.  Neuro: Cranial nerves intact, reflexes equal bilaterally. Sensory-motor testing grossly intact. Tendon reflexes grossly intact.  Pysch: Alert & oriented x 3.  Insight and judgement nl & appropriate. No ideations.  Assessment and Plan:  1. Essential hypertension  - Continue medication, monitor blood pressure at home.  - Continue DASH diet.  Reminder to go to the ER if any CP,  SOB, nausea, dizziness, severe HA, changes vision/speech.  - CBC with Differential/Platelet - COMPLETE METABOLIC PANEL WITH GFR - Magnesium - TSH  2. Hyperlipidemia, mixed  - Continue diet/meds, exercise,& lifestyle modifications.  - Continue monitor periodic cholesterol/liver & renal functions   - Lipid panel - TSH  3. Prediabetes  - Continue diet, exercise, lifestyle modifications.  - Monitor appropriate labs.  - Hemoglobin A1c - Insulin, random  4. Vitamin D deficiency  - Continue supplementation.   - VITAMIN D 25 Hydroxyl  5. Bronchitis  - predniSONE (DELTASONE) 20 MG tablet; 1 tab 3 x day for 3 days, then 1 tab 2 x day for 3 days, then 1 tab 1 x day for 5 days  Dispense: 20 tablet; Refill: 0  - azithromycin (ZITHROMAX) 250 MG tablet; Take 2 tablets (500 mg) on  Day 1,  followed by 1 tablet (250 mg) once daily on Days 2 through 5.  Dispense: 6 each; Refill: 1  6. Medication  management  - CBC with Differential/Platelet - COMPLETE METABOLIC PANEL WITH GFR - Magnesium - Lipid panel - TSH - Hemoglobin A1c - Insulin, random - VITAMIN D 25 Hydroxyl          Discussed  regular exercise, BP monitoring, weight control to achieve/maintain BMI less than 25 and discussed med and SE's. Recommended labs to assess and monitor clinical status with further disposition pending results of labs. Over 30 minutes of exam, counseling, chart review was performed.

## 2017-07-04 NOTE — Patient Instructions (Signed)

## 2017-07-10 ENCOUNTER — Other Ambulatory Visit: Payer: Self-pay | Admitting: Internal Medicine

## 2017-07-10 LAB — COMPLETE METABOLIC PANEL WITH GFR
AG RATIO: 1.6 (calc) (ref 1.0–2.5)
ALKALINE PHOSPHATASE (APISO): 69 U/L (ref 40–115)
ALT: 18 U/L (ref 9–46)
AST: 19 U/L (ref 10–35)
Albumin: 4.6 g/dL (ref 3.6–5.1)
BUN: 19 mg/dL (ref 7–25)
CO2: 26 mmol/L (ref 20–32)
Calcium: 9.5 mg/dL (ref 8.6–10.3)
Chloride: 103 mmol/L (ref 98–110)
Creat: 0.97 mg/dL (ref 0.70–1.25)
GFR, Est African American: 97 mL/min/{1.73_m2} (ref >=60)
GFR, Est Non African American: 84 mL/min/{1.73_m2} (ref >=60)
GLOBULIN: 2.8 g/dL (ref 1.9–3.7)
Glucose, Bld: 81 mg/dL (ref 65–99)
POTASSIUM: 4.6 mmol/L (ref 3.5–5.3)
SODIUM: 138 mmol/L (ref 135–146)
Total Bilirubin: 0.5 mg/dL (ref 0.2–1.2)
Total Protein: 7.4 g/dL (ref 6.1–8.1)

## 2017-07-10 LAB — LIPID PANEL
CHOL/HDL RATIO: 4.8 (calc) (ref <5.0)
CHOLESTEROL: 149 mg/dL (ref <200)
HDL: 31 mg/dL — ABNORMAL LOW (ref >40)
LDL Cholesterol (Calc): 92 mg/dL (calc)
Non-HDL Cholesterol (Calc): 118 mg/dL (calc) (ref <130)
Triglycerides: 166 mg/dL — ABNORMAL HIGH (ref <150)

## 2017-07-10 LAB — MAGNESIUM: MAGNESIUM: 2 mg/dL (ref 1.5–2.5)

## 2017-07-10 LAB — HEMOGLOBIN A1C
HEMOGLOBIN A1C: 5.4 %{Hb} (ref <5.7)
MEAN PLASMA GLUCOSE: 108 (calc)
eAG (mmol/L): 6 (calc)

## 2017-07-10 LAB — INSULIN, RANDOM: INSULIN: 11.2 u[IU]/mL (ref 2.0–19.6)

## 2017-07-10 LAB — TSH: TSH: 2.93 m[IU]/L (ref 0.40–4.50)

## 2017-07-10 LAB — VITAMIN D 25 HYDROXY (VIT D DEFICIENCY, FRACTURES): Vit D, 25-Hydroxy: 35 ng/mL (ref 30–100)

## 2017-09-29 ENCOUNTER — Other Ambulatory Visit: Payer: Self-pay | Admitting: Internal Medicine

## 2017-10-12 ENCOUNTER — Encounter: Payer: Self-pay | Admitting: Internal Medicine

## 2017-10-12 ENCOUNTER — Ambulatory Visit (INDEPENDENT_AMBULATORY_CARE_PROVIDER_SITE_OTHER): Payer: Medicare Other | Admitting: Internal Medicine

## 2017-10-12 VITALS — BP 138/82 | HR 72 | Temp 97.5°F | Resp 18 | Ht 69.0 in | Wt 221.6 lb

## 2017-10-12 DIAGNOSIS — Z1212 Encounter for screening for malignant neoplasm of rectum: Secondary | ICD-10-CM | POA: Diagnosis not present

## 2017-10-12 DIAGNOSIS — R7309 Other abnormal glucose: Secondary | ICD-10-CM | POA: Diagnosis not present

## 2017-10-12 DIAGNOSIS — N401 Enlarged prostate with lower urinary tract symptoms: Secondary | ICD-10-CM | POA: Diagnosis not present

## 2017-10-12 DIAGNOSIS — Z125 Encounter for screening for malignant neoplasm of prostate: Secondary | ICD-10-CM | POA: Diagnosis not present

## 2017-10-12 DIAGNOSIS — N138 Other obstructive and reflux uropathy: Secondary | ICD-10-CM | POA: Diagnosis not present

## 2017-10-12 DIAGNOSIS — Z79899 Other long term (current) drug therapy: Secondary | ICD-10-CM | POA: Diagnosis not present

## 2017-10-12 DIAGNOSIS — I10 Essential (primary) hypertension: Secondary | ICD-10-CM

## 2017-10-12 DIAGNOSIS — Z87891 Personal history of nicotine dependence: Secondary | ICD-10-CM | POA: Diagnosis not present

## 2017-10-12 DIAGNOSIS — R7303 Prediabetes: Secondary | ICD-10-CM | POA: Diagnosis not present

## 2017-10-12 DIAGNOSIS — E559 Vitamin D deficiency, unspecified: Secondary | ICD-10-CM

## 2017-10-12 DIAGNOSIS — Z8249 Family history of ischemic heart disease and other diseases of the circulatory system: Secondary | ICD-10-CM | POA: Diagnosis not present

## 2017-10-12 DIAGNOSIS — Z136 Encounter for screening for cardiovascular disorders: Secondary | ICD-10-CM | POA: Diagnosis not present

## 2017-10-12 DIAGNOSIS — Z1211 Encounter for screening for malignant neoplasm of colon: Secondary | ICD-10-CM

## 2017-10-12 DIAGNOSIS — E782 Mixed hyperlipidemia: Secondary | ICD-10-CM

## 2017-10-12 NOTE — Progress Notes (Signed)
[facility]   Anthony Boone, M.D.     Dyanne Carrel. Steffanie Dunn, P.A.-C Judd Gaudier, DNP [facility]                [name]                [place], [place]. [address] Telephone [phone] Telefax [phone]   Comprehensive Evaluation & Examination     This very nice 62 y.o. MWM presents for a  comprehensive evaluation and management of multiple medical co-morbidities.  Patient has been followed for HTN, HLD, Prediabetes and Vitamin D Deficiency.     Patient had a work injury in Nov 2014 with a torn Lt Quadriceps operated by Dr Beverely Low and ultimately was approved SS Disability in Apr 2017 - predated to 2014. Since then he relates he has been very active working around his home & yard.      Labile  HTN predates circa 2010. Patient's BP has been controlled at home.  Today's BP is at goal - 138/82. Patient denies any cardiac symptoms as chest pain, palpitations, shortness of breath, dizziness or ankle swelling.     Patient's hyperlipidemia is controlled with diet and medications. Patient denies myalgias or other medication SE's. Last lipids were at goal albeit sl elevated Trig's:  Lab Results  Component Value Date   CHOL 149 07/04/2017   HDL 31 (L) 07/04/2017   LDLCALC 92 07/04/2017   TRIG 166 (H) 07/04/2017   CHOLHDL 4.8 07/04/2017      Patient has hx/o Morbid Obesity (BMI 34+) and prediabetes (A1c 5.8%/2012) and patient denies reactive hypoglycemic symptoms, visual blurring, diabetic polys or paresthesias. Last A1c was Normal & at goal: Lab Results  Component Value Date   HGBA1C 5.4 07/04/2017       Finally, patient has history of Vitamin D Deficiency ("24"/2012) and last vitamin D was still low: Lab Results  Component Value Date   VD25OH 35 07/04/2017   Current Outpatient Medications on File Prior to Visit  Medication Sig  . bisoprolol-hydrochlorothiazide (ZIAC) 10-6.25 MG tablet TAKE 1 TABLET BY MOUTH  EVERY DAY  . lisinopril (PRINIVIL,ZESTRIL) 40 MG tablet Take 1 tablet (40 mg total) by mouth daily.  . sildenafil (REVATIO) 20 MG tablet Take one to five tablets daily as needed   No current facility-administered medications on file prior to visit.    NKA  Past Medical History:  Diagnosis Date  . Hyperlipidemia   . Hypertension   . Vitamin D deficiency    Health Maintenance  Topic Date Due  . INFLUENZA VACCINE  10/12/2017  . PNEUMOCOCCAL POLYSACCHARIDE VACCINE (1) 10/13/2018 (Originally 01/17/1958)  . COLONOSCOPY  09/09/2018  . TETANUS/TDAP  03/14/2021  . Hepatitis C Screening  Completed  . HIV Screening  Completed   Immunization History  Administered Date(s) Administered  . PPD Test 07/31/2014  . Td 11/12/2001  . Tdap 03/15/2011   Last Colon - 09/08/2008 - Dr Arlyce Dice - hyperplastic polyp - 10 yr f/u   Past Surgical History:  Procedure Laterality Date  . APPENDECTOMY  1974  . VASECTOMY  2005   Dr Edwin Cap   Family History  Problem Relation Age of Onset  . Heart disease Father   . Hypertension Brother    Social History   Socioeconomic History  . Marital status: Married    Spouse name: Not on file  . Number of children: Not on file  . Years of education: Not on  file  . Highest education level: Not on file  Occupational History  . 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 Smoking status: Former Games developermoker  . Smokeless tobacco: Never Used  Substance and Sexual Activity  . Alcohol use: Yes  . Drug use: No  . Sexual activity: Not on file    ROS Constitutional: Denies fever, chills, weight loss/gain, headaches, insomnia,  night sweats or change in appetite. Does c/o fatigue. Eyes: Denies redness, blurred vision, diplopia, discharge, itchy or watery eyes.  ENT: Denies discharge, congestion, post nasal drip, epistaxis, sore throat, earache, hearing loss, dental pain, Tinnitus, Vertigo, Sinus pain or snoring.  Cardio: Denies chest pain, palpitations,  irregular heartbeat, syncope, dyspnea, diaphoresis, orthopnea, PND, claudication or edema Respiratory: denies cough, dyspnea, DOE, pleurisy, hoarseness, laryngitis or wheezing.  Gastrointestinal: Denies dysphagia, heartburn, reflux, water brash, pain, cramps, nausea, vomiting, bloating, diarrhea, constipation, hematemesis, melena, hematochezia, jaundice or hemorrhoids Genitourinary: Denies dysuria, frequency, discharge, hematuria or flank pain.  Has urgency, nocturia x 2-3 & occasional hesitancy. Musculoskeletal: Denies arthralgia, myalgia, stiffness, Jt. Swelling, pain, limp or strain/sprain. Denies Falls. Skin: Denies puritis, rash, hives, warts, acne, eczema or change in skin lesion Neuro: No weakness, tremor, incoordination, spasms, paresthesia or pain Psychiatric: Denies confusion, memory loss or sensory loss. Denies Depression. Endocrine: Denies change in weight, skin, hair change, nocturia, and paresthesia, diabetic polys, visual blurring or hyper / hypo glycemic episodes.  Heme/Lymph: No excessive bleeding, bruising or enlarged lymph nodes.  Physical Exam  BP 138/82   Pulse 72   Temp (!) 97.5 F (36.4 C)   Resp 18   Ht 5\' 9"  (1.753 m)   Wt 221 lb 9.6 oz (100.5 kg)   BMI 32.72 kg/m   General Appearance: Over nourished and well groomed and in no apparent distress.  Eyes: PERRLA, EOMs, conjunctiva no swelling or erythema, normal fundi and vessels. Sinuses: No frontal/maxillary tenderness ENT/Mouth: EACs patent / TMs  nl. Nares clear without erythema, swelling, mucoid exudates. Oral hygiene is good. No erythema, swelling, or exudate. Tongue normal, non-obstructing. Tonsils not swollen or erythematous. Hearing normal.  Neck: Supple, thyroid not palpable. No bruits, nodes or JVD. Respiratory: Respiratory effort normal.  BS equal and clear bilateral without rales, rhonci, wheezing or stridor. Cardio: Heart sounds are normal with regular rate and rhythm and no murmurs, rubs or gallops.  Peripheral pulses are normal and equal bilaterally without edema. No aortic or femoral bruits. Chest: symmetric with normal excursions and percussion.  Abdomen: Soft, with Nl bowel sounds. Nontender, no guarding, rebound, hernias, masses, or organomegaly.  Lymphatics: Non tender without lymphadenopathy.  Genitourinary: No hernias.Testes nl. DRE - prostate nl for age - smooth & firm w/o nodules. Musculoskeletal: Full ROM all peripheral extremities, joint stability, 5/5 strength, and normal gait. Skin: Warm and dry without rashes, lesions, cyanosis, clubbing or  ecchymosis.  Neuro: Cranial nerves intact, reflexes equal bilaterally. Normal muscle tone, no cerebellar symptoms. Sensation intact.  Pysch: Alert and oriented X 3 with normal affect, insight and judgment appropriate.   Assessment and Plan  1. Essential hypertension  - EKG 12-Lead - US, RETROPERITNL ABD,  LTD - Urinalysis, Routine w reflex microscopic - Microalbumin / creatinine urine ratio - CBC with Differential/Platelet - COMPLETE METABOLIC PANEL WITH GFR - Magnesium - TSH  2. Hyperlipidemia, mixed  - EKG 12-Lead - US, RETROPERITNL ABD,  LTD - Lipid panel - TSH  3. Abnormal glucose  - EKG 12-Lead - US, RETROPERITNL ABD,  LTD - Hemoglobin  A1c - Insulin, random  4. Vitamin D deficiency  - VITAMIN D 25 Hydroxyl  5. Prediabetes  - Hemoglobin A1c - Insulin, random  6. Encounter for colorectal cancer screening  - POC Hemoccult Bld/Stl   7. BPH with obstruction/lower urinary tract symptoms  - PSA  8. Prostate cancer screening  - PSA  9. Screening for ischemic heart disease  - EKG 12-Lead  10. Screening for AAA (aortic abdominal aneurysm)  - [place], RETROPERITNL ABD,  LTD  11. Former smoker  - EKG 12-Lead - [place], RETROPERITNL ABD,  LTD  12. FHx: heart disease - EKG 12-Lead - [place], RETROPERITNL ABD,  LTD  13. Medication management  - Urinalysis, Routine w reflex microscopic - Microalbumin /  creatinine urine ratio - CBC with Differential/Platelet - COMPLETE METABOLIC PANEL WITH GFR - Magnesium - Lipid panel - TSH - Hemoglobin A1c - Insulin, random - VITAMIN D 25 Hydroxyl       Patient was counseled in prudent diet, weight control to achieve/maintain BMI less than 25, BP monitoring, regular exercise and medications as discussed.  Discussed med effects and SE's. Routine screening labs and tests as requested with regular follow-up as recommended. Over 40 minutes of exam, counseling, chart review and high complex critical decision making was performed

## 2017-10-12 NOTE — Patient Instructions (Signed)

## 2017-10-13 LAB — URINALYSIS, ROUTINE W REFLEX MICROSCOPIC
BILIRUBIN URINE: NEGATIVE
Bacteria, UA: NONE SEEN /HPF
GLUCOSE, UA: NEGATIVE
HGB URINE DIPSTICK: NEGATIVE
Hyaline Cast: NONE SEEN /LPF
Ketones, ur: NEGATIVE
Leukocytes, UA: NEGATIVE
NITRITE: NEGATIVE
PROTEIN: NEGATIVE
RBC / HPF: NONE SEEN /HPF (ref 0–2)
Specific Gravity, Urine: 1.014 (ref 1.001–1.03)
WBC UA: NONE SEEN /HPF (ref 0–5)
pH: 6 (ref 5.0–8.0)

## 2017-10-13 LAB — MICROALBUMIN / CREATININE URINE RATIO
CREATININE, URINE: 68 mg/dL (ref 20–320)
Microalb Creat Ratio: 13 mcg/mg creat (ref <30)
Microalb, Ur: 0.9 mg/dL

## 2017-10-13 LAB — CBC WITH DIFFERENTIAL/PLATELET
BASOS ABS: 60 {cells}/uL (ref 0–200)
Basophils Relative: 0.8 %
EOS ABS: 83 {cells}/uL (ref 15–500)
Eosinophils Relative: 1.1 %
HEMATOCRIT: 47.4 % (ref 38.5–50.0)
HEMOGLOBIN: 16.1 g/dL (ref 13.2–17.1)
LYMPHS ABS: 2505 {cells}/uL (ref 850–3900)
MCH: 29.2 pg (ref 27.0–33.0)
MCHC: 34 g/dL (ref 32.0–36.0)
MCV: 86 fL (ref 80.0–100.0)
MPV: 9.7 fL (ref 7.5–12.5)
Monocytes Relative: 6.8 %
Neutro Abs: 4343 cells/uL (ref 1500–7800)
Neutrophils Relative %: 57.9 %
Platelets: 175 10*3/uL (ref 140–400)
RBC: 5.51 10*6/uL (ref 4.20–5.80)
RDW: 13.3 % (ref 11.0–15.0)
Total Lymphocyte: 33.4 %
WBC mixed population: 510 cells/uL (ref 200–950)
WBC: 7.5 10*3/uL (ref 3.8–10.8)

## 2017-10-13 LAB — TSH: TSH: 2.88 mIU/L (ref 0.40–4.50)

## 2017-10-13 LAB — COMPLETE METABOLIC PANEL WITH GFR
AG RATIO: 2 (calc) (ref 1.0–2.5)
ALBUMIN MSPROF: 4.9 g/dL (ref 3.6–5.1)
ALT: 16 U/L (ref 9–46)
AST: 16 U/L (ref 10–35)
Alkaline phosphatase (APISO): 71 U/L (ref 40–115)
BILIRUBIN TOTAL: 0.8 mg/dL (ref 0.2–1.2)
BUN: 22 mg/dL (ref 7–25)
CALCIUM: 9.9 mg/dL (ref 8.6–10.3)
CHLORIDE: 101 mmol/L (ref 98–110)
CO2: 28 mmol/L (ref 20–32)
Creat: 1.04 mg/dL (ref 0.70–1.25)
GFR, EST NON AFRICAN AMERICAN: 77 mL/min/{1.73_m2} (ref >=60)
GFR, Est African American: 89 mL/min/{1.73_m2} (ref >=60)
Globulin: 2.5 g/dL (calc) (ref 1.9–3.7)
Glucose, Bld: 93 mg/dL (ref 65–99)
POTASSIUM: 4.4 mmol/L (ref 3.5–5.3)
Sodium: 140 mmol/L (ref 135–146)
Total Protein: 7.4 g/dL (ref 6.1–8.1)

## 2017-10-13 LAB — VITAMIN D 25 HYDROXY (VIT D DEFICIENCY, FRACTURES): VIT D 25 HYDROXY: 36 ng/mL (ref 30–100)

## 2017-10-13 LAB — LIPID PANEL
CHOL/HDL RATIO: 4.5 (calc) (ref <5.0)
Cholesterol: 158 mg/dL (ref <200)
HDL: 35 mg/dL — AB (ref >40)
LDL Cholesterol (Calc): 86 mg/dL (calc)
Non-HDL Cholesterol (Calc): 123 mg/dL (calc) (ref <130)
TRIGLYCERIDES: 284 mg/dL — AB (ref <150)

## 2017-10-13 LAB — HEMOGLOBIN A1C
EAG (MMOL/L): 5.7 (calc)
Hgb A1c MFr Bld: 5.2 % of total Hgb (ref <5.7)
Mean Plasma Glucose: 103 (calc)

## 2017-10-13 LAB — PSA: PSA: 0.7 ng/mL (ref <=4.0)

## 2017-10-13 LAB — MAGNESIUM: Magnesium: 2 mg/dL (ref 1.5–2.5)

## 2017-10-13 LAB — INSULIN, RANDOM: INSULIN: 6.8 u[IU]/mL (ref 2.0–19.6)

## 2018-01-12 DIAGNOSIS — E669 Obesity, unspecified: Secondary | ICD-10-CM | POA: Insufficient documentation

## 2018-01-12 DIAGNOSIS — E66811 Obesity, class 1: Secondary | ICD-10-CM | POA: Insufficient documentation

## 2018-01-12 DIAGNOSIS — N529 Male erectile dysfunction, unspecified: Secondary | ICD-10-CM

## 2018-01-12 HISTORY — DX: Obesity, class 1: E66.811

## 2018-01-12 HISTORY — DX: Male erectile dysfunction, unspecified: N52.9

## 2018-01-12 NOTE — Progress Notes (Signed)
MEDICARE ANNUAL WELLNESS VISIT AND FOLLOW UP Assessment:   Dylon was seen today for follow-up and medicare wellness.  Diagnoses and all orders for this visit:  Encounter for Medicare annual wellness exam  Essential hypertension Continue medications for now, patient request that we recheck prior to med adjustments, recheck later this week, switch lisinopril to telmisartan  Monitor blood pressure at home; call if consistently over 130/80 Continue DASH diet.   Reminder to go to the ER if any CP, SOB, nausea, dizziness, severe HA, changes vision/speech, left arm numbness and tingling and jaw pain.  Vitamin D deficiency -     VITAMIN D 25 Hydroxy (Vit-D Deficiency, Fractures)  Other abnormal glucose -     COMPLETE METABOLIC PANEL WITH GFR  Medication management -     CBC with Differential/Platelet -     COMPLETE METABOLIC PANEL WITH GFR  Hyperlipidemia, unspecified hyperlipidemia type LDL at goal off of medications Continue low cholesterol diet and exercise.  Check lipid panel.  -     Lipid panel -     TSH  Obesity (BMI 30.0-34.9) Long discussion about weight loss, diet, and exercise Recommended diet heavy in fruits and veggies and low in animal meats, cheeses, and dairy products, appropriate calorie intake Discussed appropriate weight for height Follow up at next visit  Erectile dysfunction, unspecified erectile dysfunction type Managed fairly by current medications; lifestyle discussed   Over 30 minutes of exam, counseling, chart review, and critical decision making was performed  Future Appointments  Date Time Provider Department Center  04/18/2018  9:30 AM Lucky Cowboy, MD [facility] None  11/06/2018  9:00 AM Lucky Cowboy, MD [facility] None     Plan:   During the course of the visit the patient was educated and counseled about appropriate screening and preventive services including:    Pneumococcal vaccine   Influenza vaccine  Prevnar 13  Td  vaccine  Screening electrocardiogram  Colorectal cancer screening  Diabetes screening  Glaucoma screening  Nutrition counseling    Subjective:  Anthony Boone is a 62 y.o. male who presents for Medicare Annual Wellness Visit and 3 month follow up for HTN, hyperlipidemia, glucose management, and vitamin D Def. Patient had a work injury in Nov 2014 with a torn Lt Quadriceps operated by Dr Beverely Low and ultimately was approved SS Disability in Apr 2017 - predated to 2014.   BMI is Body mass index is 32.64 kg/m., he has been working on diet and exercise. Wt Readings from Last 3 Encounters:  01/15/18 221 lb (100.2 kg)  10/12/17 221 lb 9.6 oz (100.5 kg)  07/04/17 224 lb 3.2 oz (101.7 kg)   He does not check BP home, today their BP is BP: (!) 146/86 Similar on manual recheck. He admits he just had 2 cups of coffee and attributes elevated BP to this.  He does not workout. He denies chest pain, shortness of breath, dizziness.   He is not on cholesterol medication and denies myalgias. His LDL cholesterol is at goal, trigs remain elevated. The cholesterol last visit was:   Lab Results  Component Value Date   CHOL 158 10/12/2017   HDL 35 (L) 10/12/2017   LDLCALC 86 10/12/2017   TRIG 284 (H) 10/12/2017   CHOLHDL 4.5 10/12/2017   He has been working on diet and exercise for glucose management, and denies increased appetite, nausea, paresthesia of the feet, polydipsia and polyuria. Last A1C in the office was:  Lab Results  Component Value Date  HGBA1C 5.2 10/12/2017   Last GFR Lab Results  Component Value Date   GFRNONAA 77 10/12/2017    Patient is on Vitamin D supplement.   Lab Results  Component Value Date   VD25OH 36 10/12/2017      Medication Review:   Current Outpatient Medications (Cardiovascular):  .  bisoprolol-hydrochlorothiazide (ZIAC) 10-6.25 MG tablet, TAKE 1 TABLET BY MOUTH EVERY DAY .  lisinopril (PRINIVIL,ZESTRIL) 40 MG tablet, Take 1 tablet (40 mg total)  by mouth daily. .  sildenafil (REVATIO) 20 MG tablet, Take one to five tablets daily as needed     Current Outpatient Medications (Other):  Marland Kitchen  CHOLECALCIFEROL PO, Take 6,000 Units by mouth daily.  Allergies: No Known Allergies  Current Problems (verified) has Hyperlipidemia; Hypertension; Other abnormal glucose; Vitamin D deficiency; Medication management; Obesity (BMI 30.0-34.9); and Erectile dysfunction on their problem list.  Screening Tests Immunization History  Administered Date(s) Administered  . PPD Test 07/31/2014  . Td 11/12/2001  . Tdap 03/15/2011    Preventative care: Last colonoscopy: 08/2008, due 2020 EGD: 08/2008  Prior vaccinations: TD or Tdap: 2013  Influenza: DUE  Pneumococcal: - Prevnar13: Due at age 66 Shingles/Zostavax: n/a  Names of Other Physician/Practitioners you currently use: 1. [facility] here for primary care 2. Dr. Caryn Section, eye doctor, last visit 2017 3. Dr. Yehuda Mao, dentist, last visit 2019, goes q56m  Patient Care Team: Lucky Cowboy, MD as PCP - General (Internal Medicine) Louis Meckel, MD (Inactive) as Consulting Physician (Gastroenterology)  Surgical: He  has a past surgical history that includes Vasectomy (2005) and Appendectomy (1974). Family His family history includes Heart disease in his father; Hypertension in his brother. Social history  He reports that he quit smoking about 25 years ago. His smoking use included cigarettes. He started smoking about 44 years ago. He has a 43.50 pack-year smoking history. He has never used smokeless tobacco. He reports that he drinks alcohol. He reports that he does not use drugs.  MEDICARE WELLNESS OBJECTIVES: Physical activity: Current Exercise Habits: The patient does not participate in regular exercise at present, Exercise limited by: orthopedic condition(s) Cardiac risk factors: Cardiac Risk Factors include: advanced age (>87men, >10  women);hypertension;dyslipidemia;male gender;obesity (BMI >30kg/m2);smoking/ tobacco exposure Depression/mood screen:   Depression screen [facility] 2/9 01/15/2018  Decreased Interest 0  Down, Depressed, Hopeless 0  PHQ - 2 Score 0    ADLs:  In your present state of health, do you have any difficulty performing the following activities: 01/15/2018 10/12/2017  Hearing? N N  Vision? N N  Difficulty concentrating or making decisions? N N  Walking or climbing stairs? Y N  Comment hx of bilateral quad tear, on disability, stairs very slowly  -  Dressing or bathing? N N  Doing errands, shopping? N N  Some recent data might be hidden     Cognitive Testing  Alert? Yes  Normal Appearance?Yes  Oriented to person? Yes  Place? Yes   Time? Yes  Recall of three objects?  Yes  Can perform simple calculations? Yes  Displays appropriate judgment?Yes  Can read the correct time from a watch face?Yes  EOL planning: Does Patient Have a Medical Advance Directive?: No Would patient like information on creating a medical advance directive?: No - Patient declined   Objective:   Today's Vitals   01/15/18 0843  BP: (!) 146/86  Pulse: (!) 57  Temp: 97.9 F (36.6 C)  SpO2: 99%  Weight: 221 lb (100.2 kg)  Height: 5'  9" (1.753 m)   Body mass index is 32.64 kg/m.  General appearance: alert, no distress, WD/WN, male HEENT: normocephalic, sclerae anicteric, TMs pearly, nares patent, no discharge or erythema, pharynx normal Oral cavity: MMM, no lesions Neck: supple, no lymphadenopathy, no thyromegaly, no masses Heart: RRR, normal S1, S2, no murmurs Lungs: CTA bilaterally, no wheezes, rhonchi, or rales Abdomen: +bs, soft, non tender, non distended, no masses, no hepatomegaly, no splenomegaly Musculoskeletal: nontender, no swelling, no obvious deformity Extremities: no edema, no cyanosis, no clubbing Pulses: 2+ symmetric, upper and lower extremities, normal cap refill Neurological: alert, oriented x 3,  CN2-12 intact, strength normal upper extremities and lower extremities, sensation normal throughout, DTRs diminished bilateral knees, no cerebellar signs, gait mildly antalgic Psychiatric: normal affect, behavior normal, pleasant   Medicare Attestation I have personally reviewed: The patient's medical and social history Their use of alcohol, tobacco or illicit drugs Their current medications and supplements The patient's functional ability including ADLs,fall risks, home safety risks, cognitive, and hearing and visual impairment Diet and physical activities Evidence for depression or mood disorders  The patient's weight, height, BMI, and visual acuity have been recorded in the chart.  I have made referrals, counseling, and provided education to the patient based on review of the above and I have provided the patient with a written personalized care plan for preventive services.     Dan Maker, NP   01/15/2018

## 2018-01-15 ENCOUNTER — Encounter: Payer: Self-pay | Admitting: Adult Health

## 2018-01-15 ENCOUNTER — Ambulatory Visit (INDEPENDENT_AMBULATORY_CARE_PROVIDER_SITE_OTHER): Payer: Medicare Other | Admitting: Adult Health

## 2018-01-15 VITALS — BP 146/86 | HR 57 | Temp 97.9°F | Ht 69.0 in | Wt 221.0 lb

## 2018-01-15 DIAGNOSIS — R7309 Other abnormal glucose: Secondary | ICD-10-CM

## 2018-01-15 DIAGNOSIS — Z79899 Other long term (current) drug therapy: Secondary | ICD-10-CM | POA: Diagnosis not present

## 2018-01-15 DIAGNOSIS — I10 Essential (primary) hypertension: Secondary | ICD-10-CM

## 2018-01-15 DIAGNOSIS — Z0001 Encounter for general adult medical examination with abnormal findings: Secondary | ICD-10-CM

## 2018-01-15 DIAGNOSIS — E669 Obesity, unspecified: Secondary | ICD-10-CM | POA: Diagnosis not present

## 2018-01-15 DIAGNOSIS — E559 Vitamin D deficiency, unspecified: Secondary | ICD-10-CM

## 2018-01-15 DIAGNOSIS — Z87891 Personal history of nicotine dependence: Secondary | ICD-10-CM

## 2018-01-15 DIAGNOSIS — R6889 Other general symptoms and signs: Secondary | ICD-10-CM | POA: Diagnosis not present

## 2018-01-15 DIAGNOSIS — Z23 Encounter for immunization: Secondary | ICD-10-CM | POA: Diagnosis not present

## 2018-01-15 DIAGNOSIS — E785 Hyperlipidemia, unspecified: Secondary | ICD-10-CM

## 2018-01-15 DIAGNOSIS — N529 Male erectile dysfunction, unspecified: Secondary | ICD-10-CM | POA: Diagnosis not present

## 2018-01-15 DIAGNOSIS — Z0000 Encounter for general adult medical examination without abnormal findings: Secondary | ICD-10-CM

## 2018-01-15 HISTORY — DX: Personal history of nicotine dependence: Z87.891

## 2018-01-15 NOTE — Patient Instructions (Addendum)
Mr. Anthony Boone , Thank you for taking time to come for your Medicare Wellness Visit. I appreciate your ongoing commitment to your health goals. Please review the following plan we discussed and let me know if I can assist you in the future.   These are the goals we discussed: Goals    . Blood Pressure < 130/80    . DIET - INCREASE WATER INTAKE     65+ fluid ounces       This is a list of the screening recommended for you and due dates:  Health Maintenance  Topic Date Due  . Flu Shot  10/12/2017  . Pneumococcal vaccine  01/15/2021*  . Colon Cancer Screening  09/09/2018  . Tetanus Vaccine  03/14/2021  .  Hepatitis C: One time screening is recommended by Center for Disease Control  (CDC) for  adults born from [birth year] through 1965.   Completed  . HIV Screening  Completed  *Topic was postponed. The date shown is not the original due date.    Food Choices to Lower Your Triglycerides Triglycerides are a type of fat in your blood. High levels of triglycerides can increase the risk of heart disease and stroke. If your triglyceride levels are high, the foods you eat and your eating habits are very important. Choosing the right foods can help lower your triglycerides. What general guidelines do I need to follow?  Lose weight if you are overweight.  Limit or avoid alcohol.  Fill one half of your plate with vegetables and green salads.  Limit fruit to two servings a day. Choose fruit instead of juice.  Make one fourth of your plate whole grains. Look for the word "whole" as the first word in the ingredient list.  Fill one fourth of your plate with lean protein foods.  Enjoy fatty fish (such as salmon, mackerel, sardines, and tuna) three times a week.  Choose healthy fats.  Limit foods high in starch and sugar.  Eat more home-cooked food and less restaurant, buffet, and fast food.  Limit fried foods.  Cook foods using methods other than frying.  Limit saturated fats.  Check  ingredient lists to avoid foods with partially hydrogenated oils (trans fats) in them. What foods can I eat? Grains Whole grains, such as whole wheat or whole grain breads, crackers, cereals, and pasta. Unsweetened oatmeal, bulgur, barley, quinoa, or brown rice. Corn or whole wheat flour tortillas. Vegetables Fresh or frozen vegetables (raw, steamed, roasted, or grilled). Green salads. Fruits All fresh, canned (in natural juice), or frozen fruits. Meat and Other Protein Products Ground beef (85% or leaner), grass-fed beef, or beef trimmed of fat. Skinless chicken or [place]. Ground chicken or [place]. Pork trimmed of fat. All fish and seafood. Eggs. Dried beans, peas, or lentils. Unsalted nuts or seeds. Unsalted canned or dry beans. Dairy Low-fat dairy products, such as skim or 1% milk, 2% or reduced-fat cheeses, low-fat ricotta or cottage cheese, or plain low-fat yogurt. Fats and Oils Tub margarines without trans fats. Light or reduced-fat mayonnaise and salad dressings. Avocado. Safflower, olive, or canola oils. Natural peanut or almond butter. The items listed above may not be a complete list of recommended foods or beverages. Contact your dietitian for more options. What foods are not recommended? Grains White bread. White pasta. White rice. Cornbread. Bagels, pastries, and croissants. Crackers that contain trans fat. Vegetables White potatoes. Corn. Creamed or fried vegetables. Vegetables in a cheese sauce. Fruits Dried fruits. Canned fruit in light or heavy syrup. Fruit  juice. Meat and Other Protein Products Fatty cuts of meat. Ribs, chicken wings, bacon, sausage, bologna, salami, chitterlings, fatback, hot dogs, bratwurst, and packaged luncheon meats. Dairy Whole or 2% milk, cream, half-and-half, and cream cheese. Whole-fat or sweetened yogurt. Full-fat cheeses. Nondairy creamers and whipped toppings. Processed cheese, cheese spreads, or cheese curds. Sweets and Desserts Corn syrup,  sugars, honey, and molasses. Candy. Jam and jelly. Syrup. Sweetened cereals. Cookies, pies, cakes, donuts, muffins, and ice cream. Fats and Oils Butter, stick margarine, lard, shortening, ghee, or bacon fat. Coconut, palm kernel, or palm oils. Beverages Alcohol. Sweetened drinks (such as sodas, lemonade, and fruit drinks or punches). The items listed above may not be a complete list of foods and beverages to avoid. Contact your dietitian for more information. This information is not intended to replace advice given to you by your health care provider. Make sure you discuss any questions you have with your health care provider. Document Released: 12/17/2003 Document Revised: 08/06/2015 Document Reviewed: 01/02/2013 Elsevier Interactive Patient Education  2017 [employer].

## 2018-01-15 NOTE — Addendum Note (Signed)
Addended by: Dionicio Stall on: 01/15/2018 10:12 AM   Modules accepted: Orders

## 2018-01-16 LAB — CBC WITH DIFFERENTIAL/PLATELET
Basophils Absolute: 41 cells/uL (ref 0–200)
Basophils Relative: 0.7 %
Eosinophils Absolute: 89 cells/uL (ref 15–500)
Eosinophils Relative: 1.5 %
HCT: 45.4 % (ref 38.5–50.0)
Hemoglobin: 15.7 g/dL (ref 13.2–17.1)
LYMPHS ABS: 2030 {cells}/uL (ref 850–3900)
MCH: 28.8 pg (ref 27.0–33.0)
MCHC: 34.6 g/dL (ref 32.0–36.0)
MCV: 83.3 fL (ref 80.0–100.0)
MONOS PCT: 8.7 %
MPV: 9.6 fL (ref 7.5–12.5)
NEUTROS ABS: 3227 {cells}/uL (ref 1500–7800)
NEUTROS PCT: 54.7 %
PLATELETS: 156 10*3/uL (ref 140–400)
RBC: 5.45 10*6/uL (ref 4.20–5.80)
RDW: 13.4 % (ref 11.0–15.0)
TOTAL LYMPHOCYTE: 34.4 %
WBC: 5.9 10*3/uL (ref 3.8–10.8)
WBCMIX: 513 {cells}/uL (ref 200–950)

## 2018-01-16 LAB — COMPLETE METABOLIC PANEL WITH GFR
AG RATIO: 1.7 (calc) (ref 1.0–2.5)
ALKALINE PHOSPHATASE (APISO): 66 U/L (ref 40–115)
ALT: 15 U/L (ref 9–46)
AST: 15 U/L (ref 10–35)
Albumin: 4.5 g/dL (ref 3.6–5.1)
BUN: 13 mg/dL (ref 7–25)
CO2: 30 mmol/L (ref 20–32)
Calcium: 9.4 mg/dL (ref 8.6–10.3)
Chloride: 103 mmol/L (ref 98–110)
Creat: 1.04 mg/dL (ref 0.70–1.25)
GFR, Est African American: 89 mL/min/{1.73_m2} (ref >=60)
GFR, Est Non African American: 77 mL/min/{1.73_m2} (ref >=60)
GLUCOSE: 85 mg/dL (ref 65–99)
Globulin: 2.6 g/dL (calc) (ref 1.9–3.7)
POTASSIUM: 4.8 mmol/L (ref 3.5–5.3)
Sodium: 140 mmol/L (ref 135–146)
Total Bilirubin: 0.6 mg/dL (ref 0.2–1.2)
Total Protein: 7.1 g/dL (ref 6.1–8.1)

## 2018-01-16 LAB — LIPID PANEL
CHOLESTEROL: 142 mg/dL (ref <200)
HDL: 38 mg/dL — ABNORMAL LOW (ref >40)
LDL CHOLESTEROL (CALC): 78 mg/dL
Non-HDL Cholesterol (Calc): 104 mg/dL (calc) (ref <130)
TRIGLYCERIDES: 166 mg/dL — AB (ref <150)
Total CHOL/HDL Ratio: 3.7 (calc) (ref <5.0)

## 2018-01-16 LAB — VITAMIN D 25 HYDROXY (VIT D DEFICIENCY, FRACTURES): VIT D 25 HYDROXY: 35 ng/mL (ref 30–100)

## 2018-01-16 LAB — TSH: TSH: 2.44 mIU/L (ref 0.40–4.50)

## 2018-01-17 ENCOUNTER — Ambulatory Visit (INDEPENDENT_AMBULATORY_CARE_PROVIDER_SITE_OTHER): Payer: Medicare Other | Admitting: *Deleted

## 2018-01-17 VITALS — BP 122/80 | HR 64 | Temp 97.2°F | Resp 16 | Ht 69.0 in | Wt 220.2 lb

## 2018-01-17 DIAGNOSIS — I10 Essential (primary) hypertension: Secondary | ICD-10-CM | POA: Diagnosis not present

## 2018-01-17 NOTE — Patient Instructions (Signed)
Patient is here for a NV to recheck is BP, which was 122/80.  Judd Gaudier, NP is aware and patient was advised to continue his Ziac 10-6.25 and Lisinopril 40 mg.

## 2018-02-03 ENCOUNTER — Other Ambulatory Visit: Payer: Self-pay | Admitting: Internal Medicine

## 2018-04-18 ENCOUNTER — Ambulatory Visit: Payer: Self-pay | Admitting: Internal Medicine

## 2018-08-07 ENCOUNTER — Encounter: Payer: Self-pay | Admitting: Gastroenterology

## 2018-11-05 ENCOUNTER — Encounter: Payer: Self-pay | Admitting: Internal Medicine

## 2018-11-05 NOTE — Patient Instructions (Signed)

## 2018-11-05 NOTE — Progress Notes (Signed)
Comprehensive Evaluation & Examination     This very nice 63 y.o.  MWMpresents for a  comprehensive evaluation and management of multiple medical co-morbidities.  Patient has been followed for HTN, HLD, Prediabetes and Vitamin D Deficiency.     Patient has been on SS Disability since 2014 consequent of an injury related to a torn Lt Quadriceps on the job for [employer].     Patient has hx/o HTN since 2010. Patient's BP has been controlled on Ziac & Lisinopril.  Today's BP is at goal - 134/84. Patient denies any cardiac symptoms as chest pain, palpitations, shortness of breath, dizziness or ankle swelling.     Patient's hyperlipidemia is controlled with diet. Last lipids were at goal albeit elevated Trig's: Lab Results  Component Value Date   CHOL 142 01/15/2018   HDL 38 (L) 01/15/2018   LDLCALC 78 01/15/2018   TRIG 166 (H) 01/15/2018   CHOLHDL 3.7 01/15/2018      Patient has Obesity (BMI 31+) and hx/o prediabetes  (A1c 5.8% / 2012)  and patient denies reactive hypoglycemic symptoms, visual blurring, diabetic polys or paresthesias. Last A1c was Normal & at goal: Lab Results  Component Value Date   HGBA1C 5.2 10/12/2017    Wt Readings from Last 3 Encounters:  11/06/18 215 lb 3.2 oz (97.6 kg)  01/17/18 220 lb 3.2 oz (99.9 kg)  01/15/18 221 lb (100.2 kg)      Finally, patient has history of Vitamin D Deficiency ("24" / 2012) and last vitamin D was still low: Lab Results  Component Value Date   VD25OH 35 01/15/2018   Current Outpatient Medications on File Prior to Visit  Medication Sig  . bisoprolol-hydrochlorothiazide (ZIAC) 10-6.25 MG tablet TAKE 1 TABLET BY MOUTH EVERY DAY  . CHOLECALCIFEROL PO Take 5,000 Units by mouth daily.   Marland Kitchen lisinopril (PRINIVIL,ZESTRIL) 40 MG tablet TAKE 1/2 TO 1 TABLET BY MOUTH DAILY AS DIRECTED  . sildenafil (REVATIO) 20 MG tablet Take one to five tablets daily as needed   No current facility-administered medications on file prior to visit.    No Known  Allergies   Past Medical History:  Diagnosis Date  . Hyperlipidemia   . Hypertension   . Vitamin D deficiency    Health Maintenance  Topic Date Due  . COLONOSCOPY  09/09/2018  . INFLUENZA VACCINE  10/13/2018  . PNEUMOCOCCAL POLYSACCHARIDE VACCINE AGE 25-64 HIGH RISK  01/15/2021 (Originally 01/17/1958)  . TETANUS/TDAP  03/14/2021  . Hepatitis C Screening  Completed  . HIV Screening  Completed   Immunization History  Administered Date(s) Administered  . Influenza Inj Mdck Quad With Preservative 01/15/2018  . PPD Test 07/31/2014  . Td 11/12/2001  . Tdap 03/15/2011   Last Colon - 09/08/2008 - Dr Deatra Ina - hyperplastic polyp - 10 yr f/u overdue June 2020- He was sent a recall letter in May by Dr.Vito Cirigliano.  Past Surgical History:  Procedure Laterality Date  . APPENDECTOMY  1974  . VASECTOMY  2005   Dr Barnie Del   Family History  Problem Relation Age of Onset  . Heart disease Father   . Hypertension Brother    Social History   Socioeconomic History  . Marital status: Married    Spouse name: Joseph Art  . Number of children: 2 daughters - 1st grandchild is due.  Occupational History  . 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 Smoking status: Former Smoker    Packs/day: 1.50  Years: 29.00    Pack years: 43.50    Types: Cigarettes    Start date: 811975    Quit date: 1994    Years since quitting: 26.6  . Smokeless tobacco: Never Used  Substance and Sexual Activity  . Alcohol use: Yes  . Drug use: No  . Sexual activity: Not on file    ROS Constitutional: Denies fever, chills, weight loss/gain, headaches, insomnia,  night sweats or change in appetite. Does c/o fatigue. Eyes: Denies redness, blurred vision, diplopia, discharge, itchy or watery eyes.  ENT: Denies discharge, congestion, post nasal drip, epistaxis, sore throat, earache, hearing loss, dental pain, Tinnitus, Vertigo, Sinus pain or snoring.  Cardio: Denies chest pain,  palpitations, irregular heartbeat, syncope, dyspnea, diaphoresis, orthopnea, PND, claudication or edema Respiratory: denies cough, dyspnea, DOE, pleurisy, hoarseness, laryngitis or wheezing.  Gastrointestinal: Denies dysphagia, heartburn, reflux, water brash, pain, cramps, nausea, vomiting, bloating, diarrhea, constipation, hematemesis, melena, hematochezia, jaundice or hemorrhoids Genitourinary: Denies dysuria, frequency, urgency, nocturia, hesitancy, discharge, hematuria or flank pain Musculoskeletal: Denies arthralgia, myalgia, stiffness, Jt. Swelling, pain, limp or strain/sprain. Denies Falls. Skin: Denies puritis, rash, hives, warts, acne, eczema or change in skin lesion Neuro: No weakness, tremor, incoordination, spasms, paresthesia or pain Psychiatric: Denies confusion, memory loss or sensory loss. Denies Depression. Endocrine: Denies change in weight, skin, hair change, nocturia, and paresthesia, diabetic polys, visual blurring or hyper / hypo glycemic episodes.  Heme/Lymph: No excessive bleeding, bruising or enlarged lymph nodes.  Physical Exam  BP 134/84   Pulse 60   Temp (!) 97.2 F (36.2 C)   Resp 16   Ht 5\' 9"  (1.753 m)   Wt 215 lb 3.2 oz (97.6 kg)   BMI 31.78 kg/m   General Appearance: Well nourished and well groomed and in no apparent distress.  Eyes: PERRLA, EOMs, conjunctiva no swelling or erythema, normal fundi and vessels. Sinuses: No frontal/maxillary tenderness ENT/Mouth: EACs patent / TMs  nl. Nares clear without erythema, swelling, mucoid exudates. Oral hygiene is good. No erythema, swelling, or exudate. Tongue normal, non-obstructing. Tonsils not swollen or erythematous. Hearing normal.  Neck: Supple, thyroid not palpable. No bruits, nodes or JVD. Respiratory: Respiratory effort normal.  BS equal and clear bilateral without rales, rhonci, wheezing or stridor. Cardio: Heart sounds are normal with regular rate and rhythm and no murmurs, rubs or gallops. Peripheral  pulses are normal and equal bilaterally without edema. No aortic or femoral bruits. Chest: symmetric with normal excursions and percussion.  Abdomen: Soft, with Nl bowel sounds. Nontender, no guarding, rebound, hernias, masses, or organomegaly.  Lymphatics: Non tender without lymphadenopathy.  Musculoskeletal: Full ROM all peripheral extremities, joint stability, 5/5 strength, and normal gait. Skin: Warm and dry without rashes, lesions, cyanosis, clubbing or  ecchymosis.  Neuro: Cranial nerves intact, reflexes equal bilaterally. Normal muscle tone, no cerebellar symptoms. Sensation intact.  Pysch: Alert and oriented X 3 with normal affect, insight and judgment appropriate.   Assessment and Plan  1. Essential hypertension  - EKG 12-Lead - Urinalysis, Routine w reflex microscopic - Microalbumin / creatinine urine ratio - CBC with Differential/Platelet - COMPLETE METABOLIC PANEL WITH GFR - Magnesium - TSH - US, RETROPERITNL ABD,  LTD  2. Hyperlipidemia, mixed  - EKG 12-Lead - Lipid panel - TSH - US, RETROPERITNL ABD,  LTD  3. Abnormal glucose  - EKG 12-Lead - Hemoglobin A1c - Insulin, random - US, RETROPERITNL ABD,  LTD  4. Vitamin D deficiency  - VITAMIN D 25 Hydroxyl  5. Prediabetes  -  EKG 12-Lead - Hemoglobin A1c - Insulin, random  6. Screening for colorectal cancer   7. BPH with obstruction/lower urinary tract symptoms  - PSA  8. Prostate cancer screening  - PSA  9. Screening for ischemic heart disease  - EKG 12-Lead  10. FHx: heart disease  - EKG 12-Lead - US, RETROPERITNL ABD,  LTD  11. Former smoker  - EKG 12-Lead - US, RETROPERITNL ABD,  LTD  12. Screening for AAA (aortic abdominal aneurysm)  - US, RETROPERITNL ABD,  LTD  13. Medication management  - Urinalysis, Routine w reflex microscopic - Microalbumin / creatinine urine ratio - CBC with Differential/Platelet - COMPLETE METABOLIC PANEL WITH GFR - Magnesium - Lipid panel - TSH  - Hemoglobin A1c - Insulin, random - VITAMIN D 25 Hydroxy       Patient was counseled in prudent diet, weight control to achieve/maintain BMI less than 25, BP monitoring, regular exercise and medications as discussed.  Discussed med effects and SE's. Routine screening labs and tests as requested with regular follow-up as recommended. Over 40 minutes of exam, counseling, chart review and high complex critical decision making was performed   Marinus MawWilliam D Vivien Barretto, MD

## 2018-11-06 ENCOUNTER — Ambulatory Visit (INDEPENDENT_AMBULATORY_CARE_PROVIDER_SITE_OTHER): Payer: Medicare Other | Admitting: Internal Medicine

## 2018-11-06 ENCOUNTER — Other Ambulatory Visit: Payer: Self-pay

## 2018-11-06 VITALS — BP 134/84 | HR 60 | Temp 97.2°F | Resp 16 | Ht 69.0 in | Wt 215.2 lb

## 2018-11-06 DIAGNOSIS — I10 Essential (primary) hypertension: Secondary | ICD-10-CM

## 2018-11-06 DIAGNOSIS — Z87891 Personal history of nicotine dependence: Secondary | ICD-10-CM

## 2018-11-06 DIAGNOSIS — Z8249 Family history of ischemic heart disease and other diseases of the circulatory system: Secondary | ICD-10-CM

## 2018-11-06 DIAGNOSIS — Z79899 Other long term (current) drug therapy: Secondary | ICD-10-CM

## 2018-11-06 DIAGNOSIS — E782 Mixed hyperlipidemia: Secondary | ICD-10-CM

## 2018-11-06 DIAGNOSIS — R7303 Prediabetes: Secondary | ICD-10-CM | POA: Diagnosis not present

## 2018-11-06 DIAGNOSIS — Z1212 Encounter for screening for malignant neoplasm of rectum: Secondary | ICD-10-CM

## 2018-11-06 DIAGNOSIS — E559 Vitamin D deficiency, unspecified: Secondary | ICD-10-CM

## 2018-11-06 DIAGNOSIS — Z1211 Encounter for screening for malignant neoplasm of colon: Secondary | ICD-10-CM

## 2018-11-06 DIAGNOSIS — R7309 Other abnormal glucose: Secondary | ICD-10-CM

## 2018-11-06 DIAGNOSIS — N138 Other obstructive and reflux uropathy: Secondary | ICD-10-CM

## 2018-11-06 DIAGNOSIS — Z136 Encounter for screening for cardiovascular disorders: Secondary | ICD-10-CM

## 2018-11-06 DIAGNOSIS — Z125 Encounter for screening for malignant neoplasm of prostate: Secondary | ICD-10-CM

## 2018-11-06 DIAGNOSIS — N401 Enlarged prostate with lower urinary tract symptoms: Secondary | ICD-10-CM | POA: Diagnosis not present

## 2018-11-06 MED ORDER — TADALAFIL 20 MG PO TABS: ORAL_TABLET | ORAL | 12 refills | Status: DC

## 2018-11-07 LAB — LIPID PANEL
Cholesterol: 157 mg/dL (ref <200)
HDL: 37 mg/dL — ABNORMAL LOW (ref >=40)
LDL Cholesterol (Calc): 95 mg/dL (calc)
Non-HDL Cholesterol (Calc): 120 mg/dL (calc) (ref <130)
Total CHOL/HDL Ratio: 4.2 (calc) (ref <5.0)
Triglycerides: 152 mg/dL — ABNORMAL HIGH (ref <150)

## 2018-11-07 LAB — URINALYSIS, ROUTINE W REFLEX MICROSCOPIC
Bilirubin Urine: NEGATIVE
Glucose, UA: NEGATIVE
Hgb urine dipstick: NEGATIVE
Ketones, ur: NEGATIVE
Leukocytes,Ua: NEGATIVE
Nitrite: NEGATIVE
Protein, ur: NEGATIVE
Specific Gravity, Urine: 1.012 (ref 1.001–1.03)
pH: 6 (ref 5.0–8.0)

## 2018-11-07 LAB — HEMOGLOBIN A1C
Hgb A1c MFr Bld: 5.4 % of total Hgb (ref <5.7)
Mean Plasma Glucose: 108 (calc)
eAG (mmol/L): 6 (calc)

## 2018-11-07 LAB — CBC WITH DIFFERENTIAL/PLATELET
Absolute Monocytes: 420 cells/uL (ref 200–950)
Basophils Absolute: 39 cells/uL (ref 0–200)
Basophils Relative: 0.7 %
Eosinophils Absolute: 101 cells/uL (ref 15–500)
Eosinophils Relative: 1.8 %
HCT: 47.3 % (ref 38.5–50.0)
Hemoglobin: 16 g/dL (ref 13.2–17.1)
Lymphs Abs: 1949 cells/uL (ref 850–3900)
MCH: 29.3 pg (ref 27.0–33.0)
MCHC: 33.8 g/dL (ref 32.0–36.0)
MCV: 86.6 fL (ref 80.0–100.0)
MPV: 9.6 fL (ref 7.5–12.5)
Monocytes Relative: 7.5 %
Neutro Abs: 3091 cells/uL (ref 1500–7800)
Neutrophils Relative %: 55.2 %
Platelets: 137 10*3/uL — ABNORMAL LOW (ref 140–400)
RBC: 5.46 10*6/uL (ref 4.20–5.80)
RDW: 13.8 % (ref 11.0–15.0)
Total Lymphocyte: 34.8 %
WBC: 5.6 10*3/uL (ref 3.8–10.8)

## 2018-11-07 LAB — MICROALBUMIN / CREATININE URINE RATIO
Creatinine, Urine: 62 mg/dL (ref 20–320)
Microalb Creat Ratio: 8 mcg/mg creat (ref <30)
Microalb, Ur: 0.5 mg/dL

## 2018-11-07 LAB — COMPLETE METABOLIC PANEL WITH GFR
AG Ratio: 2.4 (calc) (ref 1.0–2.5)
ALT: 17 U/L (ref 9–46)
AST: 14 U/L (ref 10–35)
Albumin: 4.7 g/dL (ref 3.6–5.1)
Alkaline phosphatase (APISO): 59 U/L (ref 35–144)
BUN: 18 mg/dL (ref 7–25)
CO2: 26 mmol/L (ref 20–32)
Calcium: 9.3 mg/dL (ref 8.6–10.3)
Chloride: 103 mmol/L (ref 98–110)
Creat: 0.98 mg/dL (ref 0.70–1.25)
GFR, Est African American: 95 mL/min/{1.73_m2} (ref >=60)
GFR, Est Non African American: 82 mL/min/{1.73_m2} (ref >=60)
Globulin: 2 g/dL (calc) (ref 1.9–3.7)
Glucose, Bld: 91 mg/dL (ref 65–99)
Potassium: 4.6 mmol/L (ref 3.5–5.3)
Sodium: 139 mmol/L (ref 135–146)
Total Bilirubin: 0.6 mg/dL (ref 0.2–1.2)
Total Protein: 6.7 g/dL (ref 6.1–8.1)

## 2018-11-07 LAB — TSH: TSH: 2.25 mIU/L (ref 0.40–4.50)

## 2018-11-07 LAB — INSULIN, RANDOM: Insulin: 11.6 u[IU]/mL

## 2018-11-07 LAB — MAGNESIUM: Magnesium: 1.9 mg/dL (ref 1.5–2.5)

## 2018-11-07 LAB — VITAMIN D 25 HYDROXY (VIT D DEFICIENCY, FRACTURES): Vit D, 25-Hydroxy: 48 ng/mL (ref 30–100)

## 2018-11-07 LAB — PSA: PSA: 0.5 ng/mL (ref <=4.0)

## 2018-12-21 ENCOUNTER — Other Ambulatory Visit: Payer: Self-pay | Admitting: Internal Medicine

## 2019-01-22 ENCOUNTER — Ambulatory Visit: Payer: Self-pay | Admitting: Adult Health

## 2019-01-22 ENCOUNTER — Ambulatory Visit: Payer: Medicare Other | Admitting: Adult Health Nurse Practitioner

## 2019-01-31 ENCOUNTER — Ambulatory Visit (INDEPENDENT_AMBULATORY_CARE_PROVIDER_SITE_OTHER): Payer: Medicare Other | Admitting: *Deleted

## 2019-01-31 ENCOUNTER — Other Ambulatory Visit: Payer: Self-pay

## 2019-01-31 VITALS — Temp 97.0°F

## 2019-01-31 DIAGNOSIS — Z23 Encounter for immunization: Secondary | ICD-10-CM

## 2019-02-11 NOTE — Progress Notes (Signed)
NO SHOW / CANCEL   This note is not being shared with the patient for the following reason: To respect privacy (The patient or proxy has requested that the information not be shared).  

## 2019-02-12 ENCOUNTER — Ambulatory Visit (INDEPENDENT_AMBULATORY_CARE_PROVIDER_SITE_OTHER): Payer: Medicare Other | Admitting: Adult Health Nurse Practitioner

## 2019-02-12 DIAGNOSIS — Z5329 Procedure and treatment not carried out because of patient's decision for other reasons: Secondary | ICD-10-CM

## 2019-02-25 ENCOUNTER — Other Ambulatory Visit: Payer: Self-pay | Admitting: Internal Medicine

## 2019-03-14 ENCOUNTER — Encounter: Payer: Self-pay | Admitting: Adult Health Nurse Practitioner

## 2019-05-14 ENCOUNTER — Other Ambulatory Visit: Payer: Self-pay

## 2019-05-14 ENCOUNTER — Ambulatory Visit (INDEPENDENT_AMBULATORY_CARE_PROVIDER_SITE_OTHER): Payer: Medicare Other | Admitting: Physician Assistant

## 2019-05-14 ENCOUNTER — Encounter: Payer: Self-pay | Admitting: Physician Assistant

## 2019-05-14 VITALS — BP 140/80 | HR 87 | Temp 97.1°F | Wt 229.0 lb

## 2019-05-14 DIAGNOSIS — R066 Hiccough: Secondary | ICD-10-CM

## 2019-05-14 DIAGNOSIS — R1013 Epigastric pain: Secondary | ICD-10-CM | POA: Diagnosis not present

## 2019-05-14 DIAGNOSIS — H6121 Impacted cerumen, right ear: Secondary | ICD-10-CM | POA: Diagnosis not present

## 2019-05-14 MED ORDER — PANTOPRAZOLE SODIUM 40 MG PO TBEC: 40.0000 mg | DELAYED_RELEASE_TABLET | Freq: Every day | ORAL | 1 refills | Status: DC

## 2019-05-14 NOTE — Patient Instructions (Addendum)
Hiccups  Try these maneuvers are designed to interrupt normal respiratory function, stimulate/irritate the nasopharynx or uvula, increase vagal stimulation, or relieve irritation of the diaphragm. Examples include breath holding or performing Valsalva maneuver (to increase hypercapnia), sipping cold water, pulling on the tongue, gargling with water, or swallowing a teaspoon of dry sugar (to irritate the nasopharynx), pressing on the eyeballs (for vagal stimulation), and pulling knees to chest or leaning forward to compress the chest (to relieve pressure on the diaphragm)  Going to start on protonix do 2 x a day for 3-5 days and then once a day   If not better tomorrow, want to get further tests.   You are due for your colonoscopy  Colon cancer is 3rd most diagnosed cancer and 2nd leading cause of death in both men and women 59 years of age and older despite being one of the most preventable and treatable cancers if found early.  4 of out 5 people diagnosed with colon cancer have NO prior family history.  When caught EARLY 90% of colon cancer is curable.     A hiccup is the result of a sudden irritation of a muscle that is used for breathing (diaphragm). The diaphragm is located under your lungs and above your stomach. When the diaphragm gets irritated, it may quickly tighten without your control (have a spasm). The spasm causes you to quickly suck in air, and that causes your vocal cords to close together quickly. These reactions cause the hiccup sound. Hiccups usually last only a short amount of time (less than 48 hours). In unusual cases, they can last for days or months and require you to see your health care provider. Common causes of hiccups include:  Eating too quickly.  Eating too much food.  Drinking alcohol or bubbly (carbonated) drinks.  Eating or drinking hot or spicy foods and drinks.  Swallowing extra air when sucking on candy or a straw or chewing on gum.  Feeling nervous,  stressed, or excited.  Having certain conditions that irritate the diaphragm nerves.  Having metabolic or nervous system disorders. Follow these instructions at home:  Watch your hiccups for any changes.  To prevent hiccups or to lessen discomfort from hiccups: ? Eat and chew your food slowly. ? Eat small meals, and avoid overeating. ? Limit alcohol intake to no more than 1 drink a day for nonpregnant women and 2 drinks a day for men. One drink equals 12 oz of beer, 5 oz of wine, or 1 oz of hard liquor. ? Limit your drinking of carbonated or fizzy drinks, such as soda. ? Avoid eating or drinking hot or spicy foods and drinks.  Take over-the-counter and prescription medicines only as told by your health care provider. Contact a health care provider if:  Your hiccups last for more than 48 hours.  Your hiccups do not improve with treatment.  You cannot sleep or eat because of the hiccups.  You have unexpected weight loss due to the hiccups.  You have numbness, tingling, or weakness. Get help right away if:  You have trouble breathing or swallowing.  You have severe pain in your abdomen. Summary  A hiccup is the result of a sudden irritation of a muscle that is used for breathing (diaphragm).  Hiccups can be caused by many things, including eating too quickly.  See your health care provider if your hiccups last for more than 48 hours. This information is not intended to replace advice given to you by your  health care provider. Make sure you discuss any questions you have with your health care provider. Document Revised: 02/10/2017 Document Reviewed: 11/07/2016 Elsevier Patient Education  2020 Else[employer] Inc[employer]  Seborrheic Dermatitis, Adult Seborrheic dermatitis is a skin disease that causes red, scaly patches. It usually occurs on the scalp, and it is often called dandruff. The patches may appear on other parts of the body. Skin patches tend to appear where there are many oil  glands in the skin. Areas of the body that are commonly affected include:  Scalp.  Skin folds of the body.  Ears.  Eyebrows.  Neck.  Face.  Armpits.  The bearded area of men's faces. The condition may come and go for no known reason, and it is often long-lasting (chronic). What are the causes? The cause of this condition is not known. What increases the risk? This condition is more likely to develop in people who:  Have certain conditions, such as: ? HIV (human immunodeficiency virus). ? AIDS (acquired immunodeficiency syndrome). ? Parkinson disease. ? Mood disorders, such as depression.  Are 6-59 years old. What are the signs or symptoms? Symptoms of this condition include:  Thick scales on the scalp.  Redness on the face or in the armpits.  Skin that is flaky. The flakes may be white or yellow.  Skin that seems oily or dry but is not helped with moisturizers.  Itching or burning in the affected areas. How is this diagnosed? This condition is diagnosed with a medical history and physical exam. A sample of your skin may be tested (skin biopsy). You may need to see a skin specialist (dermatologist). How is this treated? There is no cure for this condition, but treatment can help to manage the symptoms. You may get treatment to remove scales, lower the risk of skin infection, and reduce swelling or itching. Treatment may include:  Creams that reduce swelling and irritation (steroids).  Creams that reduce skin yeast.  Medicated shampoo, soaps, moisturizing creams, or ointments.  Medicated moisturizing creams or ointments. Follow these instructions at home:  Apply over-the-counter and prescription medicines only as told by your health care provider.  Use any medicated shampoo, soaps, skin creams, or ointments only as told by your health care provider.  Keep all follow-up visits as told by your health care provider. This is important. Contact a health care  provider if:  Your symptoms do not improve with treatment.  Your symptoms get worse.  You have new symptoms. This information is not intended to replace advice given to you by your health care provider. Make sure you discuss any questions you have with your health care provider. Document Revised: 02/10/2017 Document Reviewed: 06/18/2015 Elsevier Patient Education  2020 [employer].

## 2019-05-14 NOTE — Progress Notes (Signed)
Subjective:    Patient ID: Anthony Boone, male    DOB: [birth year], 64 y.o.   MRN: [id number]  HPI 63 y.o. male with history of HTN, chol, aortic atherosclerosis presents with hiccups for at least 6 hours. It started before eating anything this AM, did have water. Started at 130-2 AM, woke him up from his sleep. Felt bloated this AM and took famotidine at 730AM  He does have GERD regularly.  Colonoscopy and EDG- 08/2008, Dr. Deatra Ina- he is due He denies recent dexamethasone, ETOH use, rare use of NSAIDS. Only drinks water, no carbonated drinks.     He has had some SOB this AM, epigastric tightness and bloating. No chest pain, no back pain.  Denies HA, fever, chills, numbness,tingling, tremors.  Denies sore throat, ear issues, cough.  Denies AB pain, diarrhea, constipation, nausea, vomiting.    Blood pressure 140/80, pulse 87, temperature (!) 97.1 F (36.2 C), weight 229 lb (103.9 kg), SpO2 96 %.  Medications   Current Outpatient Medications (Cardiovascular):  .  bisoprolol-hydrochlorothiazide (ZIAC) 10-6.25 MG tablet, Take 1 tablet Daily for BP .  lisinopril (ZESTRIL) 40 MG tablet, Take 1 tablet Daily for BP .  tadalafil (CIALIS) 20 MG tablet, Take 1/2 to 1 tablet every 2 to 3 days as needed for XXXX     Current Outpatient Medications (Other):  Marland Kitchen  CHOLECALCIFEROL PO, Take 5,000 Units by mouth daily.   Problem list He has Hyperlipidemia; Aortic atherosclerosis ([place]); Other abnormal glucose; Vitamin D deficiency; Medication management; Obesity (BMI 30.0-34.9); Erectile dysfunction; and Former smoker on their problem list.    Review of Systems  Constitutional: Negative.  Negative for chills, fatigue and fever.  HENT: Negative.   Respiratory: Positive for shortness of breath. Negative for apnea, cough, choking, chest tightness, wheezing and stridor.   Cardiovascular: Negative for chest pain, palpitations and leg swelling.  Gastrointestinal: Positive for abdominal distention  and abdominal pain. Negative for anal bleeding, blood in stool, constipation, diarrhea, nausea, rectal pain and vomiting.       +GERD  Genitourinary: Negative.   Musculoskeletal: Negative.   Skin: Negative.   Neurological: Negative.   Psychiatric/Behavioral: Negative.        Objective:   Physical Exam Constitutional:      Appearance: He is obese.  HENT:     Head: Normocephalic and atraumatic.     Right Ear: There is impacted cerumen (possible hair posterior to cerumen).     Left Ear: Tympanic membrane normal.     Nose: Nose normal.     Mouth/Throat:     Mouth: Mucous membranes are moist.     Pharynx: Oropharynx is clear. No oropharyngeal exudate or posterior oropharyngeal erythema.  Eyes:     Extraocular Movements: Extraocular movements intact.     Pupils: Pupils are equal, round, and reactive to light.  Cardiovascular:     Rate and Rhythm: Normal rate and regular rhythm.     Heart sounds: Normal heart sounds.  Pulmonary:     Effort: Pulmonary effort is normal.     Breath sounds: Normal breath sounds.  Abdominal:     General: Abdomen is protuberant. Bowel sounds are normal.     Palpations: There is no fluid wave, hepatomegaly or splenomegaly.     Tenderness: There is abdominal tenderness in the epigastric area. There is no guarding or rebound.     Hernia: No hernia is present.  Musculoskeletal:        General: Normal range of motion.  Cervical back: Normal range of motion and neck supple.  Skin:    General: Skin is warm.  Neurological:     Mental Status: He is alert.        Assessment & Plan:  [name] was seen today for hiccups.  Diagnoses and all orders for this visit:  Hiccup/epigastric pain - treat GERD with protonix - get EKG, rule out heart issues with epigastric/SOB - taught valsalva maneuver, if not better- follow up for CXR, AB [place], and labs -     pantoprazole (PROTONIX) 40 MG tablet; Take 1 tablet (40 mg total) by mouth daily.  Cerumen impaction  right ear Possible hair pushing on ear drum will do irrigation in the office - stop using Qtips, irrigation used in the office without complications, use OTC drops/oil at home to prevent reoccurence

## 2019-05-16 ENCOUNTER — Other Ambulatory Visit: Payer: Self-pay

## 2019-05-16 ENCOUNTER — Ambulatory Visit
Admission: RE | Admit: 2019-05-16 | Discharge: 2019-05-16 | Disposition: A | Discharge status: Home / Self Care | Payer: Medicare Other | Source: Ambulatory Visit | Attending: Physician Assistant | Admitting: Physician Assistant

## 2019-05-16 ENCOUNTER — Other Ambulatory Visit: Payer: Medicare Other

## 2019-05-16 DIAGNOSIS — Z79899 Other long term (current) drug therapy: Secondary | ICD-10-CM

## 2019-05-16 DIAGNOSIS — E538 Deficiency of other specified B group vitamins: Secondary | ICD-10-CM | POA: Diagnosis not present

## 2019-05-16 DIAGNOSIS — Z130 Encounter for screening for diseases of the blood and blood-forming organs and certain disorders involving the immune mechanism: Secondary | ICD-10-CM

## 2019-05-16 DIAGNOSIS — R066 Hiccough: Secondary | ICD-10-CM | POA: Diagnosis not present

## 2019-05-16 DIAGNOSIS — E611 Iron deficiency: Secondary | ICD-10-CM

## 2019-05-16 DIAGNOSIS — I10 Essential (primary) hypertension: Secondary | ICD-10-CM

## 2019-05-16 NOTE — Patient Instructions (Signed)
INFORMATION ABOUT YOUR XRAY  Can walk into [address] building for an Personal assistant. They will have the order and take you back. You do not any paper work, I should get the result back today or tomorrow. This order is good for a year.  Can call [phone] to schedule an appointment if you wish.    Please follow up next week for office visit,    Hiccups  A hiccup is the result of a sudden irritation of a muscle that is used for breathing (diaphragm). The diaphragm is located under your lungs and above your stomach. When the diaphragm gets irritated, it may quickly tighten without your control (have a spasm). The spasm causes you to quickly suck in air, and that causes your vocal cords to close together quickly. These reactions cause the hiccup sound. Hiccups usually last only a short amount of time (less than 48 hours). In unusual cases, they can last for days or months and require you to see your health care provider. Common causes of hiccups include:  Eating too quickly.  Eating too much food.  Drinking alcohol or bubbly (carbonated) drinks.  Eating or drinking hot or spicy foods and drinks.  Swallowing extra air when sucking on candy or a straw or chewing on gum.  Feeling nervous, stressed, or excited.  Having certain conditions that irritate the diaphragm nerves.  Having metabolic or nervous system disorders. Follow these instructions at home:  Watch your hiccups for any changes.  To prevent hiccups or to lessen discomfort from hiccups: ? Eat and chew your food slowly. ? Eat small meals, and avoid overeating. ? Limit alcohol intake to no more than 1 drink a day for nonpregnant women and 2 drinks a day for men. One drink equals 12 oz of beer, 5 oz of wine, or 1 oz of hard liquor. ? Limit your drinking of carbonated or fizzy drinks, such as soda. ? Avoid eating or drinking hot or spicy foods and drinks.  Take over-the-counter and prescription medicines only as told by your  health care provider. Contact a health care provider if:  Your hiccups last for more than 48 hours.  Your hiccups do not improve with treatment.  You cannot sleep or eat because of the hiccups.  You have unexpected weight loss due to the hiccups.  You have numbness, tingling, or weakness. Get help right away if:  You have trouble breathing or swallowing.  You have severe pain in your abdomen. Summary  A hiccup is the result of a sudden irritation of a muscle that is used for breathing (diaphragm).  Hiccups can be caused by many things, including eating too quickly.  See your health care provider if your hiccups last for more than 48 hours. This information is not intended to replace advice given to you by your health care provider. Make sure you discuss any questions you have with your health care provider. Document Revised: 02/10/2017 Document Reviewed: 11/07/2016 Elsevier Patient Education  2020 [employer].

## 2019-05-17 LAB — CBC WITH DIFFERENTIAL/PLATELET
Absolute Monocytes: 440 cells/uL (ref 200–950)
Basophils Absolute: 31 cells/uL (ref 0–200)
Basophils Relative: 0.5 %
Eosinophils Absolute: 81 cells/uL (ref 15–500)
Eosinophils Relative: 1.3 %
HCT: 47.4 % (ref 38.5–50.0)
Hemoglobin: 15.9 g/dL (ref 13.2–17.1)
Lymphs Abs: 1699 cells/uL (ref 850–3900)
MCH: 29.4 pg (ref 27.0–33.0)
MCHC: 33.5 g/dL (ref 32.0–36.0)
MCV: 87.6 fL (ref 80.0–100.0)
MPV: 9.5 fL (ref 7.5–12.5)
Monocytes Relative: 7.1 %
Neutro Abs: 3949 cells/uL (ref 1500–7800)
Neutrophils Relative %: 63.7 %
Platelets: 136 10*3/uL — ABNORMAL LOW (ref 140–400)
RBC: 5.41 10*6/uL (ref 4.20–5.80)
RDW: 13.9 % (ref 11.0–15.0)
Total Lymphocyte: 27.4 %
WBC: 6.2 10*3/uL (ref 3.8–10.8)

## 2019-05-17 LAB — COMPLETE METABOLIC PANEL WITH GFR
AG Ratio: 2 (calc) (ref 1.0–2.5)
ALT: 17 U/L (ref 9–46)
AST: 15 U/L (ref 10–35)
Albumin: 4.5 g/dL (ref 3.6–5.1)
Alkaline phosphatase (APISO): 57 U/L (ref 35–144)
BUN: 13 mg/dL (ref 7–25)
CO2: 30 mmol/L (ref 20–32)
Calcium: 9.2 mg/dL (ref 8.6–10.3)
Chloride: 105 mmol/L (ref 98–110)
Creat: 0.97 mg/dL (ref 0.70–1.25)
GFR, Est African American: 96 mL/min/{1.73_m2} (ref >=60)
GFR, Est Non African American: 83 mL/min/{1.73_m2} (ref >=60)
Globulin: 2.2 g/dL (calc) (ref 1.9–3.7)
Glucose, Bld: 99 mg/dL (ref 65–99)
Potassium: 4.8 mmol/L (ref 3.5–5.3)
Sodium: 141 mmol/L (ref 135–146)
Total Bilirubin: 0.7 mg/dL (ref 0.2–1.2)
Total Protein: 6.7 g/dL (ref 6.1–8.1)

## 2019-05-17 LAB — IRON, TOTAL/TOTAL IRON BINDING CAP
%SAT: 27 % (calc) (ref 20–48)
Iron: 85 ug/dL (ref 50–180)
TIBC: 317 mcg/dL (calc) (ref 250–425)

## 2019-05-17 LAB — VITAMIN B12: Vitamin B-12: 271 pg/mL (ref 200–1100)

## 2019-05-17 LAB — TSH: TSH: 1.61 mIU/L (ref 0.40–4.50)

## 2019-05-17 LAB — SEDIMENTATION RATE: Sed Rate: 6 mm/h (ref 0–20)

## 2019-05-17 NOTE — Progress Notes (Signed)
--  Patient is aware of lab results. -E. Welch

## 2019-05-21 ENCOUNTER — Ambulatory Visit: Payer: Medicare Other | Admitting: Internal Medicine

## 2019-08-14 ENCOUNTER — Other Ambulatory Visit: Payer: Self-pay | Admitting: Internal Medicine

## 2019-08-14 MED ORDER — BISOPROLOL-HYDROCHLOROTHIAZIDE 10-6.25 MG PO TABS: ORAL_TABLET | ORAL | 0 refills | Status: DC

## 2019-11-10 ENCOUNTER — Other Ambulatory Visit: Payer: Self-pay | Admitting: Internal Medicine

## 2019-12-01 ENCOUNTER — Encounter: Payer: Self-pay | Admitting: Internal Medicine

## 2019-12-01 NOTE — Progress Notes (Signed)
Comprehensive Evaluation & Examination      This very nice 64 y.o.  MWM  presents for a  comprehensive evaluation and management of multiple medical co-morbidities.  Patient has been followed for HTN, HLD,  Prediabetes and Vitamin D Deficiency.        Patient has been on SS Disability since age 89 yo 2014 consequent of an injury related to a torn Lt Quadriceps on the job for [employer].     HTN predates circa 2010. Patient's BP has been controlled and today's BP was borderline elevated at [address] and rechecked at 146/86. Patient relates that he did not take his BP meds this am.  Patient denies any cardiac symptoms as chest pain, palpitations, shortness of breath, dizziness or ankle swelling.     Patient's hyperlipidemia is controlled with diet. Last lipids were at goal except elevated Trig's:  Lab Results  Component Value Date   CHOL 157 11/06/2018   HDL 37 (L) 11/06/2018   LDLCALC 95 11/06/2018   TRIG 152 (H) 11/06/2018   CHOLHDL 4.2 11/06/2018       Patient has Moderate Obesity  (BMI 31+) and consequent  prediabetes (A1c 5.8% /2012)  and patient denies reactive hypoglycemic symptoms, visual blurring, diabetic polys or paresthesias. Last A1c was Normal & at goal:  Lab Results  Component Value Date   HGBA1C 5.4 11/06/2018        Finally, patient has history of Vitamin D Deficiency ("24" /2012) and last vitamin D was slightly low (goal 70-100):  Lab Results  Component Value Date   VD25OH 48 11/06/2018     Current Outpatient Medications on File Prior to Visit  Medication Sig  . bisoprolol-hctz 10-6.25 MG  Take 1 tablet    Daily    for BP  . Vitamin D Take 5,000 Units by mouth daily.   Marland Kitchen lisinopril  40 MG tablet Take 1 tablet Daily for BP  . pantoprazole  40 MG tablet Take 1 tablet (40 mg total) by mouth daily.  . tadalafil  20 MG tablet Take 1/2 -1 tab every 2-3 days as needed for XXXX    No Known Allergies    Past Medical History:  Diagnosis Date  . Hyperlipidemia   .  Hypertension   . Vitamin D deficiency    Health Maintenance  Topic Date Due  . COVID-19 Vaccine (1) Never done  . COLONOSCOPY  09/09/2018  . INFLUENZA VACCINE  10/13/2019  . PNEUMOCOCCAL POLYSACCHARIDE VACCINE AGE 65-64 HIGH RISK  01/15/2021 (Originally 01/17/1958)  . TETANUS/TDAP  03/14/2021  . Hepatitis C Screening  Completed  . HIV Screening  Completed   Immunization History  Administered Date(s) Administered  . Influenza Inj Mdck Quad With Preservative 01/15/2018, 01/31/2019  . PPD Test 07/31/2014  . Td 11/12/2001  . Tdap 03/15/2011   Last Colon -   09/08/2008 - Dr Arlyce Dice - hyperplastic polyp - 10 yr f/u overdue June 2020- He was sent a recall letter in May by Dr.Vito Cirigliano.  Past Surgical History:  Procedure Laterality Date  . APPENDECTOMY  1974  . VASECTOMY  2005   Dr Edwin Cap   Family History  Problem Relation Age of Onset  . Heart disease Father   . Hypertension Brother    Social History   Socioeconomic History  . Marital status: Married    Spouse name: Luster Landsberg  . Number of children: 2 daughters & 1 Grandchild  Occupational History  . 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 Smoking status: Former Smoker    Packs/day: 1.50    Years: 29.00    Pack years: 43.50    Types: Cigarettes    Start date: 47    Quit date: 1994    Years since quitting: 27.7  . Smokeless tobacco: Never Used  Substance and Sexual Activity  . Alcohol use: Yes  . Drug use: No  . Sexual activity: Not on file     ROS Constitutional: Denies fever, chills, weight loss/gain, headaches, insomnia,  night sweats or change in appetite. Does c/o fatigue. Eyes: Denies redness, blurred vision, diplopia, discharge, itchy or watery eyes.  ENT: Denies discharge, congestion, post nasal drip, epistaxis, sore throat, earache, hearing loss, dental pain, Tinnitus, Vertigo, Sinus pain or snoring.  Cardio: Denies chest pain, palpitations, irregular heartbeat,  syncope, dyspnea, diaphoresis, orthopnea, PND, claudication or edema Respiratory: denies cough, dyspnea, DOE, pleurisy, hoarseness, laryngitis or wheezing.  Gastrointestinal: Denies dysphagia, heartburn, reflux, water brash, pain, cramps, nausea, vomiting, bloating, diarrhea, constipation, hematemesis, melena, hematochezia, jaundice or hemorrhoids Genitourinary: Denies dysuria, frequency, urgency, nocturia, hesitancy, discharge, hematuria or flank pain Musculoskeletal: Denies arthralgia, myalgia, stiffness, Jt. Swelling, pain, limp or strain/sprain. Denies Falls. Skin: Denies puritis, rash, hives, warts, acne, eczema or change in skin lesion Neuro: No weakness, tremor, incoordination, spasms, paresthesia or pain Psychiatric: Denies confusion, memory loss or sensory loss. Denies Depression. Endocrine: Denies change in weight, skin, hair change, nocturia, and paresthesia, diabetic polys, visual blurring or hyper / hypo glycemic episodes.  Heme/Lymph: No excessive bleeding, bruising or enlarged lymph nodes.  Physical Exam  BP (!) 122/92   Pulse 70   Temp (!) 97.5 F (36.4 C)   Ht 5\' 9"  (1.753 m)   Wt 210 lb (95.3 kg)   SpO2 97%   BMI 31.01 kg/m   General Appearance: over nourished and well groomed and in no apparent distress.  Eyes: PERRLA, EOMs, conjunctiva no swelling or erythema, normal fundi and vessels. Sinuses: No frontal/maxillary tenderness ENT/Mouth: EACs patent / TMs  nl. Nares clear without erythema, swelling, mucoid exudates. Oral hygiene is good. No erythema, swelling, or exudate. Tongue normal, non-obstructing. Tonsils not swollen or erythematous. Hearing normal.  Neck: Supple, thyroid not palpable. No bruits, nodes or JVD. Respiratory: Respiratory effort normal.  BS equal and clear bilateral without rales, rhonci, wheezing or stridor. Cardio: Heart sounds are normal with regular rate and rhythm and no murmurs, rubs or gallops. Peripheral pulses are normal and equal  bilaterally without edema. No aortic or femoral bruits. Chest: symmetric with normal excursions and percussion.  Abdomen: Soft, with Nl bowel sounds. Nontender, no guarding, rebound, hernias, masses, or organomegaly.  Lymphatics: Non tender without lymphadenopathy.  Musculoskeletal: Full ROM all peripheral extremities, joint stability, 5/5 strength, and normal gait. Skin: Warm and dry without rashes, lesions, cyanosis, clubbing or  ecchymosis.  Neuro: Cranial nerves intact, reflexes equal bilaterally. Normal muscle tone, no cerebellar symptoms. Sensation intact.  Pysch: Alert and oriented X 3 with normal affect, insight and judgment appropriate.   Assessment and Plan   1. Essential hypertension  - EKG 12-Lead - , RETROPERITNL ABD,  LTD - Urinalysis, Routine w reflex microscopic - Microalbumin / creatinine urine ratio - CBC with Differential/Platelet - COMPLETE METABOLIC PANEL WITH GFR - Magnesium - TSH  2. Hyperlipidemia, mixed  - EKG 12-Lead - [place], RETROPERITNL ABD,  LTD - Lipid panel - TSH  3. Abnormal glucose  - EKG 12-Lead - [place], RETROPERITNL ABD,  LTD - Hemoglobin  A1c - Insulin, random  4. Vitamin D deficiency  - VITAMIN D 25 Hydroxy  5. Prediabetes  - EKG 12-Lead - [place], RETROPERITNL ABD,  LTD - Hemoglobin A1c - Insulin, random  6. BPH with obstruction/lower urinary tract symptoms  - PSA  7. Obesity (BMI 30.0-34.9)   8. Screening for colorectal cancer  - POC Hemoccult Bld/Stl  9. Prostate cancer screening  - PSA  10. Screening for ischemic heart disease  - EKG 12-Lead  11. FHx: heart disease  - EKG 12-Lead - [place], RETROPERITNL ABD,  LTD  12. Former smoker  - EKG 12-Lead - [place], RETROPERITNL ABD,  LTD  13. Screening for AAA (aortic abdominal aneurysm)  - [place], RETROPERITNL ABD,  LTD  14. Medication management  - Urinalysis, Routine w reflex microscopic - Microalbumin / creatinine urine ratio - CBC with Differential/Platelet - COMPLETE  METABOLIC PANEL WITH GFR - Magnesium - Lipid panel - TSH - Hemoglobin A1c - Insulin, random - VITAMIN D 25 Hydroxy          Patient was counseled in prudent diet, weight control to achieve/maintain BMI less than 25, BP monitoring, regular exercise and medications as discussed.  Discussed med effects and SE's. Routine screening labs and tests as requested with regular follow-up as recommended. Over 40 minutes of exam, counseling, chart review and high complex critical decision making was performed   Marinus Maw, MD

## 2019-12-01 NOTE — Patient Instructions (Signed)

## 2019-12-02 ENCOUNTER — Other Ambulatory Visit: Payer: Self-pay

## 2019-12-02 ENCOUNTER — Encounter: Payer: Self-pay | Admitting: Internal Medicine

## 2019-12-02 ENCOUNTER — Ambulatory Visit (INDEPENDENT_AMBULATORY_CARE_PROVIDER_SITE_OTHER): Payer: Medicare Other | Admitting: Internal Medicine

## 2019-12-02 VITALS — BP 122/92 | HR 70 | Temp 97.5°F | Ht 69.0 in | Wt 210.0 lb

## 2019-12-02 DIAGNOSIS — E782 Mixed hyperlipidemia: Secondary | ICD-10-CM

## 2019-12-02 DIAGNOSIS — Z87891 Personal history of nicotine dependence: Secondary | ICD-10-CM

## 2019-12-02 DIAGNOSIS — E669 Obesity, unspecified: Secondary | ICD-10-CM | POA: Diagnosis not present

## 2019-12-02 DIAGNOSIS — Z125 Encounter for screening for malignant neoplasm of prostate: Secondary | ICD-10-CM

## 2019-12-02 DIAGNOSIS — Z79899 Other long term (current) drug therapy: Secondary | ICD-10-CM

## 2019-12-02 DIAGNOSIS — N138 Other obstructive and reflux uropathy: Secondary | ICD-10-CM | POA: Diagnosis not present

## 2019-12-02 DIAGNOSIS — Z8249 Family history of ischemic heart disease and other diseases of the circulatory system: Secondary | ICD-10-CM | POA: Diagnosis not present

## 2019-12-02 DIAGNOSIS — I10 Essential (primary) hypertension: Secondary | ICD-10-CM | POA: Diagnosis not present

## 2019-12-02 DIAGNOSIS — Z1212 Encounter for screening for malignant neoplasm of rectum: Secondary | ICD-10-CM

## 2019-12-02 DIAGNOSIS — R7309 Other abnormal glucose: Secondary | ICD-10-CM

## 2019-12-02 DIAGNOSIS — I700 Atherosclerosis of aorta: Secondary | ICD-10-CM

## 2019-12-02 DIAGNOSIS — R7303 Prediabetes: Secondary | ICD-10-CM | POA: Diagnosis not present

## 2019-12-02 DIAGNOSIS — Z136 Encounter for screening for cardiovascular disorders: Secondary | ICD-10-CM | POA: Diagnosis not present

## 2019-12-02 DIAGNOSIS — N401 Enlarged prostate with lower urinary tract symptoms: Secondary | ICD-10-CM | POA: Diagnosis not present

## 2019-12-02 DIAGNOSIS — E559 Vitamin D deficiency, unspecified: Secondary | ICD-10-CM

## 2019-12-02 DIAGNOSIS — E66811 Obesity, class 1: Secondary | ICD-10-CM

## 2019-12-02 DIAGNOSIS — Z1211 Encounter for screening for malignant neoplasm of colon: Secondary | ICD-10-CM

## 2019-12-02 MED ORDER — ZINC 50 MG PO TABS: ORAL_TABLET | ORAL | 0 refills | Status: DC

## 2019-12-02 MED ORDER — TADALAFIL 20 MG PO TABS
ORAL_TABLET | ORAL | 1 refills | Status: DC
Start: 2019-12-02 — End: 2020-10-22

## 2019-12-02 MED ORDER — ASPIRIN EC 81 MG PO TBEC: DELAYED_RELEASE_TABLET | ORAL | 0 refills | Status: DC

## 2019-12-03 LAB — PSA: PSA: 0.42 ng/mL (ref <=4.0)

## 2019-12-03 LAB — LIPID PANEL
Cholesterol: 128 mg/dL (ref <200)
HDL: 31 mg/dL — ABNORMAL LOW (ref >=40)
LDL Cholesterol (Calc): 76 mg/dL (calc)
Non-HDL Cholesterol (Calc): 97 mg/dL (calc) (ref <130)
Total CHOL/HDL Ratio: 4.1 (calc) (ref <5.0)
Triglycerides: 127 mg/dL (ref <150)

## 2019-12-03 LAB — MICROALBUMIN / CREATININE URINE RATIO
Creatinine, Urine: 95 mg/dL (ref 20–320)
Microalb Creat Ratio: 5 mcg/mg creat (ref <30)
Microalb, Ur: 0.5 mg/dL

## 2019-12-03 LAB — INSULIN, RANDOM: Insulin: 9.9 u[IU]/mL

## 2019-12-03 LAB — URINALYSIS, ROUTINE W REFLEX MICROSCOPIC
Bilirubin Urine: NEGATIVE
Glucose, UA: NEGATIVE
Hgb urine dipstick: NEGATIVE
Ketones, ur: NEGATIVE
Leukocytes,Ua: NEGATIVE
Nitrite: NEGATIVE
Protein, ur: NEGATIVE
Specific Gravity, Urine: 1.014 (ref 1.001–1.03)
pH: 7 (ref 5.0–8.0)

## 2019-12-03 LAB — CBC WITH DIFFERENTIAL/PLATELET
Absolute Monocytes: 312 cells/uL (ref 200–950)
Basophils Absolute: 32 cells/uL (ref 0–200)
Basophils Relative: 0.8 %
Eosinophils Absolute: 52 cells/uL (ref 15–500)
Eosinophils Relative: 1.3 %
HCT: 43.5 % (ref 38.5–50.0)
Hemoglobin: 15 g/dL (ref 13.2–17.1)
Lymphs Abs: 1792 cells/uL (ref 850–3900)
MCH: 30.1 pg (ref 27.0–33.0)
MCHC: 34.5 g/dL (ref 32.0–36.0)
MCV: 87.2 fL (ref 80.0–100.0)
MPV: 8.9 fL (ref 7.5–12.5)
Monocytes Relative: 7.8 %
Neutro Abs: 1812 cells/uL (ref 1500–7800)
Neutrophils Relative %: 45.3 %
Platelets: 120 10*3/uL — ABNORMAL LOW (ref 140–400)
RBC: 4.99 10*6/uL (ref 4.20–5.80)
RDW: 14.2 % (ref 11.0–15.0)
Total Lymphocyte: 44.8 %
WBC: 4 10*3/uL (ref 3.8–10.8)

## 2019-12-03 LAB — VITAMIN D 25 HYDROXY (VIT D DEFICIENCY, FRACTURES): Vit D, 25-Hydroxy: 38 ng/mL (ref 30–100)

## 2019-12-03 LAB — HEMOGLOBIN A1C
Hgb A1c MFr Bld: 4.8 % of total Hgb (ref <5.7)
Mean Plasma Glucose: 91 (calc)
eAG (mmol/L): 5 (calc)

## 2019-12-03 LAB — COMPLETE METABOLIC PANEL WITH GFR
AG Ratio: 2.4 (calc) (ref 1.0–2.5)
ALT: 11 U/L (ref 9–46)
AST: 13 U/L (ref 10–35)
Albumin: 4.6 g/dL (ref 3.6–5.1)
Alkaline phosphatase (APISO): 58 U/L (ref 35–144)
BUN: 15 mg/dL (ref 7–25)
CO2: 28 mmol/L (ref 20–32)
Calcium: 9.1 mg/dL (ref 8.6–10.3)
Chloride: 105 mmol/L (ref 98–110)
Creat: 1 mg/dL (ref 0.70–1.25)
GFR, Est African American: 92 mL/min/{1.73_m2} (ref >=60)
GFR, Est Non African American: 80 mL/min/{1.73_m2} (ref >=60)
Globulin: 1.9 g/dL (calc) (ref 1.9–3.7)
Glucose, Bld: 102 mg/dL — ABNORMAL HIGH (ref 65–99)
Potassium: 4.6 mmol/L (ref 3.5–5.3)
Sodium: 138 mmol/L (ref 135–146)
Total Bilirubin: 0.6 mg/dL (ref 0.2–1.2)
Total Protein: 6.5 g/dL (ref 6.1–8.1)

## 2019-12-03 LAB — MAGNESIUM: Magnesium: 2 mg/dL (ref 1.5–2.5)

## 2019-12-03 LAB — TSH: TSH: 1.23 mIU/L (ref 0.40–4.50)

## 2019-12-03 NOTE — Progress Notes (Signed)
======================================================  -    PSA -Low - Great  =======================================================  -  Total Chol = 128 and LDL Chol= 76 - Both  Excellent   - Very low risk for Heart Attack  / Stroke =======================================================  - A1c- Normal - Great -No Diabetes ========================================================  -  Vit D = 38 - Very low   - Vitamin D goal is between 70-100.   - Please take  Vitamin D 10,000 units / daily   - It is very important as a natural anti-inflammatory and helping the  immune system protect against viral infections, like the Covid-19    helping hair, skin, and nails, as well as reducing stroke and  heart attack risk.   - It helps your bones and helps with mood.  - It also decreases numerous cancer risks so please take  t as directed.   - Low Vit D is associated with a 200-300% higher risk for CANCER   and 200-300% higher risk for HEART   ATTACK  &  STROKE.    - It is also associated with higher death rate at younger ages,   autoimmune diseases like Rheumatoid arthritis, Lupus,  Multiple Sclerosis.     - Also many other serious conditions, like depression, Alzheimer's  Dementia, infertility, muscle aches, fatigue, fibromyalgia  - just to name a few =======================================================  - All Else - CBC - Kidneys - Electrolytes - Liver - Magnesium  & Thyroid    - all  Normal / OK =======================================================

## 2020-02-08 ENCOUNTER — Other Ambulatory Visit: Payer: Self-pay | Admitting: Internal Medicine

## 2020-04-21 ENCOUNTER — Other Ambulatory Visit: Payer: Self-pay | Admitting: Internal Medicine

## 2020-05-14 ENCOUNTER — Other Ambulatory Visit: Payer: Self-pay | Admitting: Internal Medicine

## 2020-06-01 NOTE — Progress Notes (Deleted)
MEDICARE ANNUAL WELLNESS VISIT AND FOLLOW UP Assessment:   [name] was seen today for follow-up and medicare wellness.  Diagnoses and all orders for this visit:  Encounter for Medicare annual wellness exam Due annually   Essential hypertension Continue medications for now, patient request that we recheck prior to med adjustments, recheck later this week, switch lisinopril to telmisartan  Monitor blood pressure at home; call if consistently over 130/80 Continue DASH diet.   Reminder to go to the ER if any CP, SOB, nausea, dizziness, severe HA, changes vision/speech, left arm numbness and tingling and jaw pain.  Vitamin D deficiency -     VITAMIN D 25 Hydroxy (Vit-D Deficiency, Fractures)  Other abnormal glucose -     COMPLETE METABOLIC PANEL WITH GFR  Medication management -     CBC with Differential/Platelet -     COMPLETE METABOLIC PANEL WITH GFR  Hyperlipidemia, unspecified hyperlipidemia type LDL at goal off of medications Continue low cholesterol diet and exercise.  Check lipid panel.  -     Lipid panel -     TSH  Obesity (BMI 30.0-34.9) *** Long discussion about weight loss, diet, and exercise Recommended diet heavy in fruits and veggies and low in animal meats, cheeses, and dairy products, appropriate calorie intake Discussed appropriate weight for height Follow up at next visit  Erectile dysfunction, unspecified erectile dysfunction type Managed fairly by current medications; lifestyle discussed   Over 30 minutes of exam, counseling, chart review, and critical decision making was performed  Future Appointments  Date Time Provider Department Center  06/02/2020  8:45 AM Judd Gaudier, NP [facility] None  12/02/2020 10:00 AM Lucky Cowboy, MD [facility] None     Plan:   During the course of the visit the patient was educated and counseled about appropriate screening and preventive services including:    Pneumococcal vaccine   Influenza  vaccine  Prevnar 13  Td vaccine  Screening electrocardiogram  Colorectal cancer screening  Diabetes screening  Glaucoma screening  Nutrition counseling    Subjective:  Anthony Boone is a 65 y.o. male who presents for Medicare Annual Wellness Visit and 3 month follow up for HTN, hyperlipidemia, glucose management, and vitamin D Def. Patient had a work injury in Nov 2014 with a torn Lt Quadriceps operated by Dr Beverely Low and ultimately was approved SS Disability in Apr 2017 - predated to 2014.   BMI is There is no height or weight on file to calculate BMI., he has been working on diet and exercise. Wt Readings from Last 3 Encounters:  12/02/19 210 lb (95.3 kg)  05/14/19 229 lb (103.9 kg)  11/06/18 215 lb 3.2 oz (97.6 kg)   He does not check BP home, today their BP is   Similar on manual recheck. He admits he just had 2 cups of coffee and attributes elevated BP to this.  He does not workout. He denies chest pain, shortness of breath, dizziness.   He is not on cholesterol medication and denies myalgias. His LDL cholesterol is at goal. The cholesterol last visit was:   Lab Results  Component Value Date   CHOL 128 12/02/2019   HDL 31 (L) 12/02/2019   LDLCALC 76 12/02/2019   TRIG 127 12/02/2019   CHOLHDL 4.1 12/02/2019   He has been working on diet and exercise for glucose management, and denies increased appetite, nausea, paresthesia of the feet, polydipsia and polyuria. Last A1C in the office was:  Lab Results  Component Value Date  HGBA1C 4.8 12/02/2019   Last GFR Lab Results  Component Value Date   GFRNONAA 80 12/02/2019    Patient is on Vitamin D supplement.   Lab Results  Component Value Date   VD25OH 38 12/02/2019      Medication Review:   Current Outpatient Medications (Cardiovascular):  .  bisoprolol-hydrochlorothiazide (ZIAC) 10-6.25 MG tablet, TAKE 1 TABLET DAILY FOR BLOOD PRESSURE .  lisinopril (ZESTRIL) 40 MG tablet, TAKE 1 TABLET DAILY FOR BLOOD  PRESSURE .  tadalafil (CIALIS) 20 MG tablet, Take 1/2 to 1 tablet every 2 to 3 days as needed for XXXX   Current Outpatient Medications (Analgesics):  .  aspirin EC 81 MG tablet, Takes 1 tablet Daily   Current Outpatient Medications (Other):  Marland Kitchen  CHOLECALCIFEROL PO, Take 5,000 Units by mouth daily.  .  Zinc 50 MG TABS, Take 1 tablet Daily  Allergies: No Known Allergies  Current Problems (verified) has Hyperlipidemia; Aortic atherosclerosis (HCC); Other abnormal glucose; Vitamin D deficiency; Medication management; Obesity (BMI 30.0-34.9); Erectile dysfunction; and Former smoker on their problem list.  Screening Tests Immunization History  Administered Date(s) Administered  . Influenza Inj Mdck Quad With Preservative 01/15/2018, 01/31/2019  . PPD Test 07/31/2014  . Td 11/12/2001  . Tdap 03/15/2011    Preventative care: Last colonoscopy: 08/2008, due 2020 *** EGD: 08/2008  Prior vaccinations: TD or Tdap: 2013  Influenza: 01/2019 ***  Pneumococcal: - Prevnar13: Due at age 12 Shingles/Zostavax: n/a Covid 19: ***  Names of Other Physician/Practitioners you currently use: 1. [facility] here for primary care 2. Dr. Caryn Boone, eye doctor, last visit 2017 3. Dr. Yehuda Boone, dentist, last visit 2019, goes q109m  Patient Care Team: Lucky Cowboy, MD as PCP - General (Internal Medicine) Louis Meckel, MD (Inactive) as Consulting Physician (Gastroenterology)  Surgical: He  has a past surgical history that includes Vasectomy (2005) and Appendectomy (1974). Family His family history includes Heart disease in his father; Hypertension in his brother. Social history  He reports that he quit smoking about 28 years ago. His smoking use included cigarettes. He started smoking about 47 years ago. He has a 43.50 pack-year smoking history. He has never used smokeless tobacco. He reports current alcohol use. He reports that he does not use drugs.  MEDICARE  WELLNESS OBJECTIVES: Physical activity:   Cardiac risk factors:   Depression/mood screen:   Depression screen [facility] 2/9 12/01/2019  Decreased Interest 1  Down, Depressed, Hopeless 0  PHQ - 2 Score 1    ADLs:  In your present state of health, do you have any difficulty performing the following activities: 12/01/2019  Hearing? N  Vision? N  Difficulty concentrating or making decisions? N  Walking or climbing stairs? N  Dressing or bathing? N  Doing errands, shopping? N  Some recent data might be hidden     Cognitive Testing  Alert? Yes  Normal Appearance?Yes  Oriented to person? Yes  Place? Yes   Time? Yes  Recall of three objects?  Yes  Can perform simple calculations? Yes  Displays appropriate judgment?Yes  Can read the correct time from a watch face?Yes  EOL planning:     Objective:   There were no vitals filed for this visit. There is no height or weight on file to calculate BMI.  General appearance: alert, no distress, WD/WN, male HEENT: normocephalic, sclerae anicteric, TMs pearly, nares patent, no discharge or erythema, pharynx normal Oral cavity: MMM, no lesions Neck: supple, no lymphadenopathy, no thyromegaly,  no masses Heart: RRR, normal S1, S2, no murmurs Lungs: CTA bilaterally, no wheezes, rhonchi, or rales Abdomen: +bs, soft, non tender, non distended, no masses, no hepatomegaly, no splenomegaly Musculoskeletal: nontender, no swelling, no obvious deformity Extremities: no edema, no cyanosis, no clubbing Pulses: 2+ symmetric, upper and lower extremities, normal cap refill Neurological: alert, oriented x 3, CN2-12 intact, strength normal upper extremities and lower extremities, sensation normal throughout, DTRs diminished bilateral knees, no cerebellar signs, gait mildly antalgic Psychiatric: normal affect, behavior normal, pleasant   Medicare Attestation I have personally reviewed: The patient's medical and social history Their use of alcohol, tobacco or  illicit drugs Their current medications and supplements The patient's functional ability including ADLs,fall risks, home safety risks, cognitive, and hearing and visual impairment Diet and physical activities Evidence for depression or mood disorders  The patient's weight, height, BMI, and visual acuity have been recorded in the chart.  I have made referrals, counseling, and provided education to the patient based on review of the above and I have provided the patient with a written personalized care plan for preventive services.     Dan Maker, NP   06/01/2020

## 2020-06-02 ENCOUNTER — Ambulatory Visit: Payer: Medicare Other | Admitting: Adult Health

## 2020-06-02 DIAGNOSIS — R7309 Other abnormal glucose: Secondary | ICD-10-CM

## 2020-06-02 DIAGNOSIS — I10 Essential (primary) hypertension: Secondary | ICD-10-CM

## 2020-06-02 DIAGNOSIS — E559 Vitamin D deficiency, unspecified: Secondary | ICD-10-CM

## 2020-06-02 DIAGNOSIS — N529 Male erectile dysfunction, unspecified: Secondary | ICD-10-CM

## 2020-06-02 DIAGNOSIS — Z79899 Other long term (current) drug therapy: Secondary | ICD-10-CM

## 2020-06-02 DIAGNOSIS — E669 Obesity, unspecified: Secondary | ICD-10-CM

## 2020-06-02 DIAGNOSIS — E785 Hyperlipidemia, unspecified: Secondary | ICD-10-CM

## 2020-06-02 DIAGNOSIS — Z87891 Personal history of nicotine dependence: Secondary | ICD-10-CM

## 2020-06-02 DIAGNOSIS — Z0000 Encounter for general adult medical examination without abnormal findings: Secondary | ICD-10-CM

## 2020-10-21 ENCOUNTER — Other Ambulatory Visit: Payer: Self-pay | Admitting: Internal Medicine

## 2020-10-21 ENCOUNTER — Other Ambulatory Visit: Payer: Self-pay

## 2020-10-21 ENCOUNTER — Ambulatory Visit
Admission: RE | Admit: 2020-10-21 | Discharge: 2020-10-21 | Disposition: A | Discharge status: Home / Self Care | Payer: Medicare Other | Source: Ambulatory Visit | Attending: Internal Medicine | Admitting: Internal Medicine

## 2020-10-21 DIAGNOSIS — R079 Chest pain, unspecified: Secondary | ICD-10-CM | POA: Diagnosis not present

## 2020-10-21 DIAGNOSIS — R0789 Other chest pain: Secondary | ICD-10-CM

## 2020-10-21 DIAGNOSIS — M47814 Spondylosis without myelopathy or radiculopathy, thoracic region: Secondary | ICD-10-CM | POA: Diagnosis not present

## 2020-10-21 NOTE — Progress Notes (Signed)
============================================================ ============================================================  -    Chest X-ray & rib  X-ray - Normal -  Lungs clear &                                         no signs of rib fractures or bone cancer in ribs  ============================================================ ============================================================

## 2020-10-22 ENCOUNTER — Ambulatory Visit (INDEPENDENT_AMBULATORY_CARE_PROVIDER_SITE_OTHER): Payer: Medicare Other | Admitting: Internal Medicine

## 2020-10-22 ENCOUNTER — Encounter: Payer: Self-pay | Admitting: Internal Medicine

## 2020-10-22 VITALS — BP 126/82 | HR 62 | Temp 98.2°F | Resp 16 | Ht 69.0 in | Wt 213.4 lb

## 2020-10-22 DIAGNOSIS — E559 Vitamin D deficiency, unspecified: Secondary | ICD-10-CM

## 2020-10-22 DIAGNOSIS — I10 Essential (primary) hypertension: Secondary | ICD-10-CM

## 2020-10-22 DIAGNOSIS — R7303 Prediabetes: Secondary | ICD-10-CM

## 2020-10-22 DIAGNOSIS — Z79899 Other long term (current) drug therapy: Secondary | ICD-10-CM

## 2020-10-22 DIAGNOSIS — R7309 Other abnormal glucose: Secondary | ICD-10-CM

## 2020-10-22 DIAGNOSIS — J0140 Acute pansinusitis, unspecified: Secondary | ICD-10-CM

## 2020-10-22 DIAGNOSIS — E782 Mixed hyperlipidemia: Secondary | ICD-10-CM

## 2020-10-22 MED ORDER — TADALAFIL 20 MG PO TABS: ORAL_TABLET | ORAL | 1 refills | Status: DC

## 2020-10-22 MED ORDER — AZITHROMYCIN 250 MG PO TABS: ORAL_TABLET | ORAL | 1 refills | Status: DC

## 2020-10-22 MED ORDER — DEXAMETHASONE 4 MG PO TABS: ORAL_TABLET | ORAL | 0 refills | Status: DC

## 2020-10-22 NOTE — Progress Notes (Signed)
Future Appointments  Date Time Provider Department Center  12/02/2020 10:00 AM Lucky Cowboy, MD [facility] None  06/02/2021  9:30 AM Judd Gaudier, NP [facility] None    History of Present Illness:       This very nice 65 y.o. MWM presents for 3 month follow up with HTN, HLD, Pre-Diabetes and Vitamin D Deficiency.  Patient also is c/o sinus pressure & HA & putrid post nasal drainage.       Patient is treated for HTN (2010) & BP has been controlled at home. Today's BP is at goal - 126/82. Patient has had no complaints of any cardiac type chest pain, palpitations, dyspnea / orthopnea / PND, dizziness, claudication, or dependent edema.       Hyperlipidemia is controlled with diet & meds. Patient denies myalgias or other med SE's. Last Lipids were at goal:  Lab Results  Component Value Date   CHOL 128 12/02/2019   HDL 31 (L) 12/02/2019   LDLCALC 76 12/02/2019   TRIG 127 12/02/2019   CHOLHDL 4.1 12/02/2019     Also, the patient has history of PreDiabetes and has had no symptoms of reactive hypoglycemia, diabetic polys, paresthesias or visual blurring.  Last A1c was normal & at goal:  Lab Results  Component Value Date   HGBA1C 4.8 12/02/2019                                                        Further, the patient also has history of Vitamin D Deficiency ("24" /2010) and supplements vitamin D without any suspected side-effects. Last vitamin D was still low:    Lab Results  Component Value Date   VD25OH 38 12/02/2019     Current Outpatient Medications on File Prior to Visit  Medication Sig   aspirin EC 81 MG tablet Takes 1 tablet Daily   bisoprolol-hydrochlorothiazide (ZIAC) 10-6.25 MG tablet TAKE 1 TABLET DAILY FOR BLOOD PRESSURE   CHOLECALCIFEROL PO Take 5,000 Units by mouth daily.    lisinopril (ZESTRIL) 40 MG tablet TAKE 1 TABLET DAILY FOR BLOOD PRESSURE   tadalafil (CIALIS) 20 MG tablet Take 1/2 to 1 tablet every 2 to 3 days as needed for XXXX   Zinc 50  MG TABS Take 1 tablet Daily    No Known Allergies  PMHx:    Past Medical History:  Diagnosis Date   Hyperlipidemia    Hypertension    Vitamin D deficiency     Immunization History  Administered Date(s) Administered   Influenza Inj Mdck Quad With Preservative 01/15/2018, 01/31/2019   PPD Test 07/31/2014   Td 11/12/2001   Tdap 03/15/2011    Past Surgical History:  Procedure Laterality Date   APPENDECTOMY  1974   VASECTOMY  2005   Dr Edwin Cap    FHx:    Reviewed / unchanged  SHx:    Reviewed / unchanged   Systems Review:  Constitutional: Denies fever, chills, wt changes, headaches, insomnia, fatigue, night sweats, change in appetite. Eyes: Denies redness, blurred vision, diplopia, discharge, itchy, watery eyes.  ENT: Denies discharge, congestion, post nasal drip, epistaxis, sore throat, earache, hearing loss, dental pain, tinnitus, vertigo, sinus pain, snoring.  CV: Denies chest pain, palpitations, irregular heartbeat, syncope, dyspnea, diaphoresis, orthopnea, PND, claudication or edema. Respiratory: denies cough, dyspnea, DOE, pleurisy, hoarseness, laryngitis, wheezing.  Gastrointestinal:  Denies dysphagia, odynophagia, heartburn, reflux, water brash, abdominal pain or cramps, nausea, vomiting, bloating, diarrhea, constipation, hematemesis, melena, hematochezia  or hemorrhoids. Genitourinary: Denies dysuria, frequency, urgency, nocturia, hesitancy, discharge, hematuria or flank pain. Musculoskeletal: Denies arthralgias, myalgias, stiffness, jt. swelling, pain, limping or strain/sprain.  Skin: Denies pruritus, rash, hives, warts, acne, eczema or change in skin lesion(s). Neuro: No weakness, tremor, incoordination, spasms, paresthesia or pain. Psychiatric: Denies confusion, memory loss or sensory loss. Endo: Denies change in weight, skin or hair change.  Heme/Lymph: No excessive bleeding, bruising or enlarged lymph nodes.  Physical Exam  BP 126/82   Pulse 62   Temp 98.2  F (36.8 C)   Resp 16   Ht 5\' 9"  (1.753 m)   Wt 213 lb 6.4 oz (96.8 kg)   SpO2 96%   BMI 31.51 kg/m   Appears  over nourished  and in no distress.  Eyes: PERRLA, EOMs, conjunctiva no swelling or erythema. Sinuses: (+)  frontal/maxillary tenderness ENT/Mouth: EAC's clear, TM's nl w/o erythema, bulging. Nares clear w/o erythema, swelling, exudates. Oropharynx clear without erythema or exudates. Oral hygiene is good. Tongue normal, non obstructing. Hearing intact.  Neck: Supple. Thyroid not palpable. Car 2+/2+ without bruits, nodes or JVD. Chest: Respirations nl with BS clear & equal w/o rales, rhonchi, wheezing or stridor.  Cor: Heart sounds normal w/ regular rate and rhythm without sig. murmurs, gallops, clicks or rubs. Peripheral pulses normal and equal  without edema.  Abdomen: Soft & bowel sounds normal. Non-tender w/o guarding, rebound, hernias, masses or organomegaly.  Lymphatics: Unremarkable.  Musculoskeletal: Full ROM all peripheral extremities, joint stability, 5/5 strength and normal gait.  Skin: Warm, dry without exposed rashes, lesions or ecchymosis apparent.  Neuro: Cranial nerves intact, reflexes equal bilaterally. Sensory-motor testing grossly intact. Tendon reflexes grossly intact.  Pysch: Alert & oriented x 3.  Insight and judgement nl & appropriate. No ideations.  Assessment and Plan:  1. Essential hypertension  - Continue medication, monitor blood pressure at home.  - Continue DASH diet.  Reminder to go to the ER if any CP,  SOB, nausea, dizziness, severe HA, changes vision/speech.    - CBC with Differential/Platelet - COMPLETE METABOLIC PANEL WITH GFR - Magnesium - TSH  2. Hyperlipidemia, mixed  - Continue diet/meds, exercise,& lifestyle modifications.  - Continue monitor periodic cholesterol/liver & renal functions     - Lipid panel - TSH  3. Abnormal glucose  - Continue diet, exercise  - Lifestyle modifications.  - Monitor appropriate labs    -  Hemoglobin A1c - Insulin, random  4. Vitamin D deficiency  - Continue supplementation.    - VITAMIN D 25 Hydroxy   5. Prediabetes  - Hemoglobin A1c - Insulin, random  6. Subacute pansinusitis  - dexamethasone  4 MG tablet;  Take 1 tab 3 x day - 3 days, then 2 x day - 3 days, then 1 tab daily   Dispense: 20 tablet  - azithromycin 250 MG tablet;  Take 2 tablets  Day 1, then 1 tablet Daily  Dispense: 6 each; Refill: 1  7. Medication management  - CBC with Differential/Platelet - COMPLETE METABOLIC PANEL WITH GFR - Magnesium - Lipid panel - TSH - Hemoglobin A1c - Insulin, random - VITAMIN D 25 Hydroxy        Discussed  regular exercise, BP monitoring, weight control to achieve/maintain BMI less than 25 and discussed med and SE's. Recommended labs to assess and monitor clinical status with further disposition pending results of labs.  I discussed the assessment and treatment plan with the patient. The patient was provided an opportunity to ask questions and all were answered. The patient agreed with the plan and demonstrated an understanding of the instructions.  I provided over 30 minutes of exam, counseling, chart review and  complex critical decision making.        The patient was advised to call back or seek an in-person evaluation if the symptoms worsen or if the condition fails to improve as anticipated.   Marinus Maw, MD

## 2020-10-22 NOTE — Patient Instructions (Signed)

## 2020-12-02 ENCOUNTER — Encounter: Payer: Medicare Other | Admitting: Internal Medicine

## 2021-01-12 ENCOUNTER — Encounter: Payer: Medicare Other | Admitting: Internal Medicine

## 2021-06-02 ENCOUNTER — Ambulatory Visit: Payer: Medicare Other | Admitting: Adult Health

## 2021-06-23 NOTE — Progress Notes (Deleted)
MEDICARE ANNUAL WELLNESS VISIT AND FOLLOW UP ?Assessment:  ? ? ? ?Over 30 minutes of exam, counseling, chart review, and critical decision making was performed ? ?Future Appointments  ?Date Time Provider Department Center  ?06/24/2021 10:00 AM Nicodemus Denk, Loma Sousa, NP [facility] None  ? ? ? ?Plan:  ? ?During the course of the visit the patient was educated and counseled about appropriate screening and preventive services including:  ? ?Pneumococcal vaccine  ?Influenza vaccine ?Prevnar 13 ?Td vaccine ?Screening electrocardiogram ?Colorectal cancer screening ?Diabetes screening ?Glaucoma screening ?Nutrition counseling  ? ? ?Subjective:  ?Anthony Boone is a 66 y.o. male who presents for Medicare Annual Wellness Visit and 3 month follow up for HTN, hyperlipidemia, glucose management, and vitamin D Def. Patient had a work injury in Nov 2014 with a torn Lt Quadriceps operated by Dr Beverely Low and ultimately was approved SS Disability in Apr 2017 - predated to 2014.  ? ?BMI is There is no height or weight on file to calculate BMI., he has been working on diet and exercise. ?Wt Readings from Last 3 Encounters:  ?10/22/20 213 lb 6.4 oz (96.8 kg)  ?12/02/19 210 lb (95.3 kg)  ?05/14/19 229 lb (103.9 kg)  ? ?He does not check BP home, today their BP is   Similar on manual recheck. He admits he just had 2 cups of coffee and attributes elevated BP to this.  ?He does not workout. He denies chest pain, shortness of breath, dizziness.  ? ?He is not on cholesterol medication and denies myalgias. His LDL cholesterol is at goal, trigs remain elevated. The cholesterol last visit was:   ?Lab Results  ?Component Value Date  ? CHOL 128 12/02/2019  ? HDL 31 (L) 12/02/2019  ? LDLCALC 76 12/02/2019  ? TRIG 127 12/02/2019  ? CHOLHDL 4.1 12/02/2019  ? ?He has been working on diet and exercise for glucose management, and denies increased appetite, nausea, paresthesia of the feet, polydipsia and polyuria. Last A1C in the office was:  ?Lab Results   ?Component Value Date  ? HGBA1C 4.8 12/02/2019  ? ?Last GFR ?Lab Results  ?Component Value Date  ? GFRNONAA 80 12/02/2019  ? ? Patient is on Vitamin D supplement.   ?Lab Results  ?Component Value Date  ? VD25OH 38 12/02/2019  ?   ? ?Medication Review: ? ?Current Outpatient Medications (Endocrine & Metabolic):  ?  dexamethasone (DECADRON) 4 MG tablet, Take 1 tab 3 x day - 3 days, then 2 x day - 3 days, then 1 tab daily ? ?Current Outpatient Medications (Cardiovascular):  ?  bisoprolol-hydrochlorothiazide (ZIAC) 10-6.25 MG tablet, TAKE 1 TABLET DAILY FOR BLOOD PRESSURE ?  lisinopril (ZESTRIL) 40 MG tablet, TAKE 1 TABLET DAILY FOR BLOOD PRESSURE ?  tadalafil (CIALIS) 20 MG tablet, Take 1/2 to 1 tablet every 2 to 3 days as needed for XXXX ? ? ?Current Outpatient Medications (Analgesics):  ?  aspirin EC 81 MG tablet, Takes 1 tablet Daily ? ? ?Current Outpatient Medications (Other):  ?  azithromycin (ZITHROMAX) 250 MG tablet, Take 2 tablets with Food on  Day 1, then 1 tablet Daily with Food for Infection ?  CHOLECALCIFEROL PO, Take 5,000 Units by mouth daily.  ?  Zinc 50 MG TABS, Take 1 tablet Daily ? ?Allergies: ?No Known Allergies ? ?Current Problems (verified) ?has Hyperlipidemia; Hypertension; Other abnormal glucose; Vitamin D deficiency; Medication management; Obesity (BMI 30.0-34.9); Erectile dysfunction; and Former smoker on their problem list. ? ?Screening Tests ?Immunization History  ?Administered Date(s)  Administered  ? Influenza Inj Mdck Quad With Preservative 01/15/2018, 01/31/2019  ? PPD Test 07/31/2014  ? Td 11/12/2001  ? Tdap 03/15/2011  ? ? ?Preventative care: ?Last colonoscopy: 08/2008, due 2020 ?EGD: 08/2008 ? ?Prior vaccinations: ?TD or Tdap: 2013  ?Influenza: DUE  ?Pneumococcal: - ?Prevnar13: Due at age 61 ?Shingles/Zostavax: n/a ? ?Names of Other Physician/Practitioners you currently use: ?1. [facility] here for primary care ?2. Dr. Caryn Section, eye doctor, last visit  2017 ?3. Dr. Yehuda Mao, dentist, last visit 2019, goes q76m ? ?Patient Care Team: ?Lucky Cowboy, MD as PCP - General (Internal Medicine) ?Louis Meckel, MD (Inactive) as Consulting Physician (Gastroenterology) ? ?Surgical: ?He  has a past surgical history that includes Vasectomy (2005) and Appendectomy (1974). ?Family ?His family history includes Heart disease in his father; Hypertension in his brother. ?Social history  ?He reports that he quit smoking about 29 years ago. His smoking use included cigarettes. He started smoking about 48 years ago. He has a 43.50 pack-year smoking history. He has never used smokeless tobacco. He reports current alcohol use. He reports that he does not use drugs. ? ?MEDICARE WELLNESS OBJECTIVES: ?Physical activity:   ?Cardiac risk factors:   ?Depression/mood screen:   ? ?  12/01/2019  ?  8:06 PM  ?Depression screen PHQ 2/9  ?Decreased Interest 1  ?Down, Depressed, Hopeless 0  ?PHQ - 2 Score 1  ?  ?ADLs:  ?   ? View : No data to display.  ?  ?  ?  ?  ? ?Cognitive Testing ? Alert? Yes  Normal Appearance?Yes ? Oriented to person? Yes  Place? Yes ?  Time? Yes ? Recall of three objects?  Yes ? Can perform simple calculations? Yes ? Displays appropriate judgment?Yes ? Can read the correct time from a watch face?Yes ? ?EOL planning:   ? ? ?Objective:  ? ?There were no vitals filed for this visit. ? ?There is no height or weight on file to calculate BMI. ? ?General appearance: alert, no distress, WD/WN, male ?HEENT: normocephalic, sclerae anicteric, TMs pearly, nares patent, no discharge or erythema, pharynx normal ?Oral cavity: MMM, no lesions ?Neck: supple, no lymphadenopathy, no thyromegaly, no masses ?Heart: RRR, normal S1, S2, no murmurs ?Lungs: CTA bilaterally, no wheezes, rhonchi, or rales ?Abdomen: +bs, soft, non tender, non distended, no masses, no hepatomegaly, no splenomegaly ?Musculoskeletal: nontender, no swelling, no obvious deformity ?Extremities: no edema, no cyanosis, no  clubbing ?Pulses: 2+ symmetric, upper and lower extremities, normal cap refill ?Neurological: alert, oriented x 3, CN2-12 intact, strength normal upper extremities and lower extremities, sensation normal throughout, DTRs diminished bilateral knees, no cerebellar signs, gait mildly antalgic ?Psychiatric: normal affect, behavior normal, pleasant  ? ?Medicare Attestation ?I have personally reviewed: ?The patient's medical and social history ?Their use of alcohol, tobacco or illicit drugs ?Their current medications and supplements ?The patient's functional ability including ADLs,fall risks, home safety risks, cognitive, and hearing and visual impairment ?Diet and physical activities ?Evidence for depression or mood disorders ? ?The patient's weight, height, BMI, and visual acuity have been recorded in the chart.  I have made referrals, counseling, and provided education to the patient based on review of the above and I have provided the patient with a written personalized care plan for preventive services.   ? ? ?Revonda Humphrey, NP   06/23/2021  ?

## 2021-06-24 ENCOUNTER — Ambulatory Visit: Payer: Medicare Other | Admitting: Nurse Practitioner

## 2021-07-07 DIAGNOSIS — J0100 Acute maxillary sinusitis, unspecified: Secondary | ICD-10-CM | POA: Diagnosis not present

## 2021-07-07 DIAGNOSIS — B9689 Other specified bacterial agents as the cause of diseases classified elsewhere: Secondary | ICD-10-CM | POA: Diagnosis not present

## 2021-07-07 DIAGNOSIS — J9801 Acute bronchospasm: Secondary | ICD-10-CM | POA: Diagnosis not present

## 2021-08-10 ENCOUNTER — Ambulatory Visit (INDEPENDENT_AMBULATORY_CARE_PROVIDER_SITE_OTHER): Payer: Medicare Other | Admitting: Nurse Practitioner

## 2021-08-10 ENCOUNTER — Encounter: Payer: Self-pay | Admitting: Nurse Practitioner

## 2021-08-10 VITALS — BP 151/92 | HR 63 | Temp 96.9°F | Wt 222.0 lb

## 2021-08-10 DIAGNOSIS — E782 Mixed hyperlipidemia: Secondary | ICD-10-CM | POA: Diagnosis not present

## 2021-08-10 DIAGNOSIS — E785 Hyperlipidemia, unspecified: Secondary | ICD-10-CM | POA: Diagnosis not present

## 2021-08-10 DIAGNOSIS — Z87891 Personal history of nicotine dependence: Secondary | ICD-10-CM

## 2021-08-10 DIAGNOSIS — R7989 Other specified abnormal findings of blood chemistry: Secondary | ICD-10-CM

## 2021-08-10 DIAGNOSIS — E669 Obesity, unspecified: Secondary | ICD-10-CM

## 2021-08-10 DIAGNOSIS — E559 Vitamin D deficiency, unspecified: Secondary | ICD-10-CM | POA: Diagnosis not present

## 2021-08-10 DIAGNOSIS — Z1389 Encounter for screening for other disorder: Secondary | ICD-10-CM | POA: Diagnosis not present

## 2021-08-10 DIAGNOSIS — R7309 Other abnormal glucose: Secondary | ICD-10-CM | POA: Diagnosis not present

## 2021-08-10 DIAGNOSIS — E538 Deficiency of other specified B group vitamins: Secondary | ICD-10-CM

## 2021-08-10 DIAGNOSIS — Z1211 Encounter for screening for malignant neoplasm of colon: Secondary | ICD-10-CM

## 2021-08-10 DIAGNOSIS — N529 Male erectile dysfunction, unspecified: Secondary | ICD-10-CM

## 2021-08-10 DIAGNOSIS — R6889 Other general symptoms and signs: Secondary | ICD-10-CM

## 2021-08-10 DIAGNOSIS — Z0001 Encounter for general adult medical examination with abnormal findings: Secondary | ICD-10-CM

## 2021-08-10 DIAGNOSIS — I10 Essential (primary) hypertension: Secondary | ICD-10-CM

## 2021-08-10 DIAGNOSIS — Z0000 Encounter for general adult medical examination without abnormal findings: Secondary | ICD-10-CM

## 2021-08-10 DIAGNOSIS — Z79899 Other long term (current) drug therapy: Secondary | ICD-10-CM

## 2021-08-10 MED ORDER — TADALAFIL 20 MG PO TABS: ORAL_TABLET | ORAL | 1 refills | Status: DC

## 2021-08-10 NOTE — Progress Notes (Signed)
MEDICARE ANNUAL WELLNESS VISIT AND FOLLOW UP Assessment:   Anthony Boone was seen today for follow-up and medicare wellness.  Diagnoses and all orders for this visit:  1. Encounter for Medicare annual wellness exam Due annually  2. Primary hypertension Controlled - Above Goal Today Continue medications; Bisoprolol-HCTZ, Lisinopril. Discussed DASH (Dietary Approaches to Stop Hypertension) DASH diet is lower in sodium than a typical American diet. Cut back on foods that are high in saturated fat, cholesterol, and trans fats. Eat more whole-grain foods, fish, poultry, and nuts Remain active and exercise as tolerated daily.  Monitor BP at home-Call if greater than 130/80.   - CBC with Differential/Platelet - COMPLETE METABOLIC PANEL WITH GFR  3. Hyperlipidemia, unspecified hyperlipidemia type Controlled Not on medications. Discussed lifestyle modifications. Recommended diet heavy in fruits and veggies, omega 3's. Decrease consumption of animal meats, cheeses, and dairy products. Remain active and exercise as tolerated. Continue to monitor.   - Lipid panel  4. Other abnormal glucose Continue lifestyle modifications.  - Hemoglobin A1c  5. Vitamin D deficiency At Goal Continue supplement. Sunlight can help convert compounds into Vitamin D. Continue to monitor.  - VITAMIN D 25 Hydroxy (Vit-D Deficiency, Fractures)  6. Obesity (BMI 30.0-34.9) Discussed appropriate BMI Goal of losing 1 lb per month. Diet modification. Physical activity. Encouraged/praised to build confidence.   - TSH  7. Erectile dysfunction, unspecified erectile dysfunction type Continue Tadalafil Medication effective.  8. Former smoker Continue to monitor.  9. Screening for hematuria or proteinuria  - Urinalysis, Routine w reflex microscopic  10. Medication management All medications reviewed in detail. All questions and concerns addressed.  - CBC with Differential/Platelet - COMPLETE METABOLIC  PANEL WITH GFR - Lipid panel - Hemoglobin A1c - VITAMIN D 25 Hydroxy (Vit-D Deficiency, Fractures) - TSH - Urinalysis, Routine w reflex microscopic  11. 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 Screening for colon cancer   - Ambulatory Referral to GI   Over 30 minutes of exam, counseling, chart review, and critical decision making was performed  Future Appointments  Date Time Provider Department Center  12/20/2021 10:00 AM Lucky Cowboy, MD [facility] None  08/15/2022  9:30 AM Adela Glimpse, NP [facility] None     Plan:   During the course of the visit the patient was educated and counseled about appropriate screening and preventive services including:   Pneumococcal vaccine  Influenza vaccine Prevnar 13 Td vaccine Screening electrocardiogram Colorectal cancer screening Diabetes screening Glaucoma screening Nutrition counseling    Subjective:  Anthony Boone is a 66 y.o. male who presents for Medicare Annual Wellness Visit and 3 month follow up for HTN, hyperlipidemia, glucose management, and vitamin D Def.   Patient had a work injury in Nov 2014 with a torn Lt Quadriceps operated by Dr Beverely Low and ultimately was approved SS Disability in Apr 2017 - predated to 2014.   Patient reports feeling well overall.  He continues to be active in the community.    He is requesting a refill for Cialis which he report is currently effective.    BMI is Body mass index is 32.78 kg/m., he has been working on diet and exercise.  He has gained 9 lb over the last 6 mo.   Wt Readings from Last 3 Encounters:  08/10/21 222 lb (100.7 kg)  10/22/20 213 lb 6.4 oz (96.8 kg)  12/02/19 210 lb (95.3 kg)   He does not check BP home, today their BP  is BP: (!) 151/92 Similar on manual recheck. He admits he just had 2 cups of coffee and attributes elevated BP to this. He sometimes forgets to take his BP medications.  He does not workout but is  active in the yard and his community. He does note occasional short very slight pain in the chest area with mild SOB/DOE when completing a strenuous activity, however.  The pain goes away instantaneously.  He contributes this to GERD.  He is not on a daily H2 blocker.    He is not on cholesterol medication and denies myalgias. His LDL cholesterol is at goal, trigs remain elevated. The cholesterol last visit was:   Lab Results  Component Value Date   CHOL 128 12/02/2019   HDL 31 (L) 12/02/2019   LDLCALC 76 12/02/2019   TRIG 127 12/02/2019   CHOLHDL 4.1 12/02/2019   He has been working on diet and exercise for glucose management, and denies increased appetite, nausea, paresthesia of the feet, polydipsia and polyuria. Last A1C in the office was:  Lab Results  Component Value Date   HGBA1C 4.8 12/02/2019   Last GFR Lab Results  Component Value Date   GFRNONAA 80 12/02/2019    Patient is on Vitamin D supplement.   Lab Results  Component Value Date   VD25OH 38 12/02/2019      Medication Review:  Current Outpatient Medications (Endocrine & Metabolic):    dexamethasone (DECADRON) 4 MG tablet, Take 1 tab 3 x day - 3 days, then 2 x day - 3 days, then 1 tab daily  Current Outpatient Medications (Cardiovascular):    bisoprolol-hydrochlorothiazide (ZIAC) 10-6.25 MG tablet, TAKE 1 TABLET DAILY FOR BLOOD PRESSURE   lisinopril (ZESTRIL) 40 MG tablet, TAKE 1 TABLET DAILY FOR BLOOD PRESSURE   tadalafil (CIALIS) 20 MG tablet, Take 1/2 to 1 tablet every 2 to 3 days as needed for XXXX   Current Outpatient Medications (Analgesics):    aspirin EC 81 MG tablet, Takes 1 tablet Daily   Current Outpatient Medications (Other):    CHOLECALCIFEROL PO, Take 5,000 Units by mouth daily.    Zinc 50 MG TABS, Take 1 tablet Daily   azithromycin (ZITHROMAX) 250 MG tablet, Take 2 tablets with Food on  Day 1, then 1 tablet Daily with Food for Infection  Allergies: No Known Allergies  Current Problems  (verified) has Hyperlipidemia; Hypertension; Other abnormal glucose; Vitamin D deficiency; Medication management; Obesity (BMI 30.0-34.9); Erectile dysfunction; and Former smoker on their problem list.  Screening Tests Immunization History  Administered Date(s) Administered   Influenza Inj Mdck Quad With Preservative 01/15/2018, 01/31/2019   PPD Test 07/31/2014   Td 11/12/2001   Tdap 03/15/2011    Preventative care: Last colonoscopy: 08/2008, due 2020 EGD: 08/2008  Prior vaccinations: TD or Tdap: 2013  Influenza: DUE  Pneumococcal: -  Prevnar13: Due at age 52 - Obtain at [facility] Shingles/Zostavax: n/a  Names of Other Physician/Practitioners you currently use: 1. [facility] here for primary care 2. Dr. Caryn Section, eye doctor, last visit 2017 - wears glasses. 3. Dr. Yehuda Mao, dentist, last visit 2022, goes q79m 4. [facility]  Patient Care Team: Lucky Cowboy, MD as PCP - General (Internal Medicine) Louis Meckel, MD (Inactive) as Consulting Physician (Gastroenterology)  Surgical: He  has a past surgical history that includes Vasectomy (2005) and Appendectomy (1974). Family His family history includes Heart disease in his father; Hypertension in his brother. Social history  He reports that he quit  smoking about 29 years ago. His smoking use included cigarettes. He started smoking about 48 years ago. He has a 43.50 pack-year smoking history. He has never used smokeless tobacco. He reports current alcohol use. He reports that he does not use drugs.  MEDICARE WELLNESS OBJECTIVES: Physical activity: Current Exercise Habits: The patient does not participate in regular exercise at present, Exercise limited by: orthopedic condition(s) Cardiac risk factors: Cardiac Risk Factors include: advanced age (>55men, >65 women);male gender;hypertension;obesity (BMI >30kg/m2) Depression/mood screen:      08/10/2021    1:32 PM  Depression screen PHQ 2/9   Decreased Interest 0  Down, Depressed, Hopeless 0  PHQ - 2 Score 0    ADLs:     08/10/2021    1:32 PM  In your present state of health, do you have any difficulty performing the following activities:  Hearing? 0  Vision? 0  Difficulty concentrating or making decisions? 0  Walking or climbing stairs? 0  Dressing or bathing? 0  Doing errands, shopping? 0  Preparing Food and eating ? N  Using the Toilet? N  In the past six months, have you accidently leaked urine? N  Do you have problems with loss of bowel control? N  Managing your Medications? N  Managing your Finances? N  Housekeeping or managing your Housekeeping? N     Cognitive Testing  Alert? Yes  Normal Appearance?Yes  Oriented to person? Yes  Place? Yes   Time? Yes  Recall of three objects?  Yes  Can perform simple calculations? Yes  Displays appropriate judgment?Yes  Can read the correct time from a watch face?Yes  EOL planning: Does Patient Have a Medical Advance Directive?: No Would patient like information on creating a medical advance directive?: No - Patient declined   Objective:   Today's Vitals   08/10/21 0930  BP: (!) 151/92  Pulse: 63  Temp: (!) 96.9 F (36.1 C)  SpO2: 96%  Weight: 222 lb (100.7 kg)   Body mass index is 32.78 kg/m.  General appearance: alert, no distress, WD/WN, male HEENT: normocephalic, sclerae anicteric, TMs pearly, nares patent, no discharge or erythema, pharynx normal Oral cavity: MMM, no lesions Neck: supple, no lymphadenopathy, no thyromegaly, no masses Heart: RRR, normal S1, S2, no murmurs Lungs: CTA bilaterally, no wheezes, rhonchi, or rales Abdomen: +bs, soft, non tender, non distended, no masses, no hepatomegaly, no splenomegaly Musculoskeletal: BLE LROM d/t pain.  nontender, no swelling, no obvious deformity Extremities: no edema, no cyanosis, no clubbing Pulses: 2+ symmetric, upper and lower extremities, normal cap refill Neurological: alert, oriented x 3,  CN2-12 intact, strength normal upper extremities and lower extremities, sensation normal throughout, DTRs diminished bilateral knees, no cerebellar signs, gait mildly antalgic Psychiatric: normal affect, behavior normal, pleasant   Medicare Attestation I have personally reviewed: The patient's medical and social history Their use of alcohol, tobacco or illicit drugs Their current medications and supplements The patient's functional ability including ADLs,fall risks, home safety risks, cognitive, and hearing and visual impairment Diet and physical activities Evidence for depression or mood disorders  The patient's weight, height, BMI, and visual acuity have been recorded in the chart.  I have made referrals, counseling, and provided education to the patient based on review of the above and I have provided the patient with a written personalized care plan for preventive services.     Adryan Shin, NP   08/10/2021  

## 2021-08-10 NOTE — Progress Notes (Signed)
MEDICARE ANNUAL WELLNESS VISIT AND FOLLOW UP Assessment:   Anthony Boone was seen today for follow-up and medicare wellness.  Diagnoses and all orders for this visit:  1. Encounter for Medicare annual wellness exam Due annually  2. Primary hypertension Controlled - Above Goal Today Continue medications; Bisoprolol-HCTZ, Lisinopril. Discussed DASH (Dietary Approaches to Stop Hypertension) DASH diet is lower in sodium than a typical American diet. Cut back on foods that are high in saturated fat, cholesterol, and trans fats. Eat more whole-grain foods, fish, poultry, and nuts Remain active and exercise as tolerated daily.  Monitor BP at home-Call if greater than 130/80.   - CBC with Differential/Platelet - COMPLETE METABOLIC PANEL WITH GFR  3. Hyperlipidemia, unspecified hyperlipidemia type Controlled Not on medications. Discussed lifestyle modifications. Recommended diet heavy in fruits and veggies, omega 3's. Decrease consumption of animal meats, cheeses, and dairy products. Remain active and exercise as tolerated. Continue to monitor.   - Lipid panel  4. Other abnormal glucose Continue lifestyle modifications.  - Hemoglobin A1c  5. Vitamin D deficiency At Goal Continue supplement. Sunlight can help convert compounds into Vitamin D. Continue to monitor.  - VITAMIN D 25 Hydroxy (Vit-D Deficiency, Fractures)  6. Obesity (BMI 30.0-34.9) Discussed appropriate BMI Goal of losing 1 lb per month. Diet modification. Physical activity. Encouraged/praised to build confidence.   - TSH  7. Erectile dysfunction, unspecified erectile dysfunction type Continue Tadalafil Medication effective.  8. Former smoker Continue to monitor.  9. Screening for hematuria or proteinuria  - Urinalysis, Routine w reflex microscopic  10. Medication management All medications reviewed in detail. All questions and concerns addressed.  - CBC with Differential/Platelet - COMPLETE METABOLIC  PANEL WITH GFR - Lipid panel - Hemoglobin A1c - VITAMIN D 25 Hydroxy (Vit-D Deficiency, Fractures) - TSH - Urinalysis, Routine w reflex microscopic  11. 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 Screening for colon cancer   - Ambulatory Referral to GI   Over 30 minutes of exam, counseling, chart review, and critical decision making was performed  Future Appointments  Date Time Provider Department Center  12/20/2021 10:00 AM Lucky Cowboy, MD [facility] None  08/15/2022  9:30 AM Adela Glimpse, NP [facility] None     Plan:   During the course of the visit the patient was educated and counseled about appropriate screening and preventive services including:   Pneumococcal vaccine  Influenza vaccine Prevnar 13 Td vaccine Screening electrocardiogram Colorectal cancer screening Diabetes screening Glaucoma screening Nutrition counseling    Subjective:  Anthony Boone is a 66 y.o. male who presents for Medicare Annual Wellness Visit and 3 month follow up for HTN, hyperlipidemia, glucose management, and vitamin D Def.   Patient had a work injury in Nov 2014 with a torn Lt Quadriceps operated by Dr Beverely Low and ultimately was approved SS Disability in Apr 2017 - predated to 2014.   Patient reports feeling well overall.  He continues to be active in the community.    He is requesting a refill for Cialis which he report is currently effective.    BMI is Body mass index is 32.78 kg/m., he has been working on diet and exercise.  He has gained 9 lb over the last 6 mo.   Wt Readings from Last 3 Encounters:  08/10/21 222 lb (100.7 kg)  10/22/20 213 lb 6.4 oz (96.8 kg)  12/02/19 210 lb (95.3 kg)   He does not check BP home, today their BP  is BP: (!) 151/92 Similar on manual recheck. He admits he just had 2 cups of coffee and attributes elevated BP to this. He sometimes forgets to take his BP medications.  He does not workout but is  active in the yard and his community. He does note occasional short very slight pain in the chest area with mild SOB/DOE when completing a strenuous activity, however.  The pain goes away instantaneously.  He contributes this to GERD.  He is not on a daily H2 blocker.    He is not on cholesterol medication and denies myalgias. His LDL cholesterol is at goal, trigs remain elevated. The cholesterol last visit was:   Lab Results  Component Value Date   CHOL 128 12/02/2019   HDL 31 (L) 12/02/2019   LDLCALC 76 12/02/2019   TRIG 127 12/02/2019   CHOLHDL 4.1 12/02/2019   He has been working on diet and exercise for glucose management, and denies increased appetite, nausea, paresthesia of the feet, polydipsia and polyuria. Last A1C in the office was:  Lab Results  Component Value Date   HGBA1C 4.8 12/02/2019   Last GFR Lab Results  Component Value Date   GFRNONAA 80 12/02/2019    Patient is on Vitamin D supplement.   Lab Results  Component Value Date   VD25OH 38 12/02/2019      Medication Review:  Current Outpatient Medications (Endocrine & Metabolic):    dexamethasone (DECADRON) 4 MG tablet, Take 1 tab 3 x day - 3 days, then 2 x day - 3 days, then 1 tab daily  Current Outpatient Medications (Cardiovascular):    bisoprolol-hydrochlorothiazide (ZIAC) 10-6.25 MG tablet, TAKE 1 TABLET DAILY FOR BLOOD PRESSURE   lisinopril (ZESTRIL) 40 MG tablet, TAKE 1 TABLET DAILY FOR BLOOD PRESSURE   tadalafil (CIALIS) 20 MG tablet, Take 1/2 to 1 tablet every 2 to 3 days as needed for XXXX   Current Outpatient Medications (Analgesics):    aspirin EC 81 MG tablet, Takes 1 tablet Daily   Current Outpatient Medications (Other):    CHOLECALCIFEROL PO, Take 5,000 Units by mouth daily.    Zinc 50 MG TABS, Take 1 tablet Daily   azithromycin (ZITHROMAX) 250 MG tablet, Take 2 tablets with Food on  Day 1, then 1 tablet Daily with Food for Infection  Allergies: No Known Allergies  Current Problems  (verified) has Hyperlipidemia; Hypertension; Other abnormal glucose; Vitamin D deficiency; Medication management; Obesity (BMI 30.0-34.9); Erectile dysfunction; and Former smoker on their problem list.  Screening Tests Immunization History  Administered Date(s) Administered   Influenza Inj Mdck Quad With Preservative 01/15/2018, 01/31/2019   PPD Test 07/31/2014   Td 11/12/2001   Tdap 03/15/2011    Preventative care: Last colonoscopy: 08/2008, due 2020 EGD: 08/2008  Prior vaccinations: TD or Tdap: 2013  Influenza: DUE  Pneumococcal: -  Prevnar13: Due at age 52 - Obtain at [facility] Shingles/Zostavax: n/a  Names of Other Physician/Practitioners you currently use: 1. [facility] here for primary care 2. Dr. Caryn Section, eye doctor, last visit 2017 - wears glasses. 3. Dr. Yehuda Mao, dentist, last visit 2022, goes q79m 4. [facility]  Patient Care Team: Lucky Cowboy, MD as PCP - General (Internal Medicine) Louis Meckel, MD (Inactive) as Consulting Physician (Gastroenterology)  Surgical: He  has a past surgical history that includes Vasectomy (2005) and Appendectomy (1974). Family His family history includes Heart disease in his father; Hypertension in his brother. Social history  He reports that he quit  smoking about 29 years ago. His smoking use included cigarettes. He started smoking about 48 years ago. He has a 43.50 pack-year smoking history. He has never used smokeless tobacco. He reports current alcohol use. He reports that he does not use drugs.  MEDICARE WELLNESS OBJECTIVES: Physical activity: Current Exercise Habits: The patient does not participate in regular exercise at present, Exercise limited by: orthopedic condition(s) Cardiac risk factors: Cardiac Risk Factors include: advanced age (>55men, >65 women);male gender;hypertension;obesity (BMI >30kg/m2) Depression/mood screen:      08/10/2021    1:32 PM  Depression screen PHQ 2/9   Decreased Interest 0  Down, Depressed, Hopeless 0  PHQ - 2 Score 0    ADLs:     08/10/2021    1:32 PM  In your present state of health, do you have any difficulty performing the following activities:  Hearing? 0  Vision? 0  Difficulty concentrating or making decisions? 0  Walking or climbing stairs? 0  Dressing or bathing? 0  Doing errands, shopping? 0  Preparing Food and eating ? N  Using the Toilet? N  In the past six months, have you accidently leaked urine? N  Do you have problems with loss of bowel control? N  Managing your Medications? N  Managing your Finances? N  Housekeeping or managing your Housekeeping? N     Cognitive Testing  Alert? Yes  Normal Appearance?Yes  Oriented to person? Yes  Place? Yes   Time? Yes  Recall of three objects?  Yes  Can perform simple calculations? Yes  Displays appropriate judgment?Yes  Can read the correct time from a watch face?Yes  EOL planning: Does Patient Have a Medical Advance Directive?: No Would patient like information on creating a medical advance directive?: No - Patient declined   Objective:   Today's Vitals   08/10/21 0930  BP: (!) 151/92  Pulse: 63  Temp: (!) 96.9 F (36.1 C)  SpO2: 96%  Weight: 222 lb (100.7 kg)   Body mass index is 32.78 kg/m.  General appearance: alert, no distress, WD/WN, male HEENT: normocephalic, sclerae anicteric, TMs pearly, nares patent, no discharge or erythema, pharynx normal Oral cavity: MMM, no lesions Neck: supple, no lymphadenopathy, no thyromegaly, no masses Heart: RRR, normal S1, S2, no murmurs Lungs: CTA bilaterally, no wheezes, rhonchi, or rales Abdomen: +bs, soft, non tender, non distended, no masses, no hepatomegaly, no splenomegaly Musculoskeletal: BLE LROM d/t pain.  nontender, no swelling, no obvious deformity Extremities: no edema, no cyanosis, no clubbing Pulses: 2+ symmetric, upper and lower extremities, normal cap refill Neurological: alert, oriented x 3,  CN2-12 intact, strength normal upper extremities and lower extremities, sensation normal throughout, DTRs diminished bilateral knees, no cerebellar signs, gait mildly antalgic Psychiatric: normal affect, behavior normal, pleasant   Medicare Attestation I have personally reviewed: The patient's medical and social history Their use of alcohol, tobacco or illicit drugs Their current medications and supplements The patient's functional ability including ADLs,fall risks, home safety risks, cognitive, and hearing and visual impairment Diet and physical activities Evidence for depression or mood disorders  The patient's weight, height, BMI, and visual acuity have been recorded in the chart.  I have made referrals, counseling, and provided education to the patient based on review of the above and I have provided the patient with a written personalized care plan for preventive services.     Laylamarie Meuser, NP   08/10/2021  

## 2021-08-10 NOTE — Patient Instructions (Signed)

## 2021-08-10 NOTE — Progress Notes (Deleted)
MEDICARE ANNUAL WELLNESS VISIT AND FOLLOW UP Assessment:   Anthony Boone was seen today for follow-up and medicare wellness.  Diagnoses and all orders for this visit:  1. Encounter for Medicare annual wellness exam Due annually  2. Primary hypertension Controlled - Above Goal Today Continue medications; Bisoprolol-HCTZ, Lisinopril. Discussed DASH (Dietary Approaches to Stop Hypertension) DASH diet is lower in sodium than a typical American diet. Cut back on foods that are high in saturated fat, cholesterol, and trans fats. Eat more whole-grain foods, fish, poultry, and nuts Remain active and exercise as tolerated daily.  Monitor BP at home-Call if greater than 130/80.   - CBC with Differential/Platelet - COMPLETE METABOLIC PANEL WITH GFR  3. Hyperlipidemia, unspecified hyperlipidemia type Controlled Not on medications. Discussed lifestyle modifications. Recommended diet heavy in fruits and veggies, omega 3's. Decrease consumption of animal meats, cheeses, and dairy products. Remain active and exercise as tolerated. Continue to monitor.   - Lipid panel  4. Other abnormal glucose Continue lifestyle modifications.  - Hemoglobin A1c  5. Vitamin D deficiency At Goal Continue supplement. Sunlight can help convert compounds into Vitamin D. Continue to monitor.  - VITAMIN D 25 Hydroxy (Vit-D Deficiency, Fractures)  6. Obesity (BMI 30.0-34.9) Discussed appropriate BMI Goal of losing 1 lb per month. Diet modification. Physical activity. Encouraged/praised to build confidence.   - TSH  7. Erectile dysfunction, unspecified erectile dysfunction type Continue Tadalafil Medication effective.  8. Former smoker Continue to monitor.  9. Screening for hematuria or proteinuria  - Urinalysis, Routine w reflex microscopic  10. Medication management All medications reviewed in detail. All questions and concerns addressed.  - CBC with Differential/Platelet - COMPLETE METABOLIC  PANEL WITH GFR - Lipid panel - Hemoglobin A1c - VITAMIN D 25 Hydroxy (Vit-D Deficiency, Fractures) - TSH - Urinalysis, Routine w reflex microscopic  11. 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 Screening for colon cancer   - Ambulatory Referral to GI   Over 30 minutes of exam, counseling, chart review, and critical decision making was performed  Future Appointments  Date Time Provider Department Center  12/20/2021 10:00 AM Lucky Cowboy, MD [facility] None  08/15/2022  9:30 AM Adela Glimpse, NP [facility] None     Plan:   During the course of the visit the patient was educated and counseled about appropriate screening and preventive services including:   Pneumococcal vaccine  Influenza vaccine Prevnar 13 Td vaccine Screening electrocardiogram Colorectal cancer screening Diabetes screening Glaucoma screening Nutrition counseling    Subjective:  Anthony Boone is a 66 y.o. male who presents for Medicare Annual Wellness Visit and 3 month follow up for HTN, hyperlipidemia, glucose management, and vitamin D Def.   Patient had a work injury in Nov 2014 with a torn Lt Quadriceps operated by Dr Beverely Low and ultimately was approved SS Disability in Apr 2017 - predated to 2014.   Patient reports feeling well overall.  He continues to be active in the community.    He is requesting a refill for Cialis which he report is currently effective.    BMI is Body mass index is 32.78 kg/m., he has been working on diet and exercise.  He has gained 9 lb over the last 6 mo.   Wt Readings from Last 3 Encounters:  08/10/21 222 lb (100.7 kg)  10/22/20 213 lb 6.4 oz (96.8 kg)  12/02/19 210 lb (95.3 kg)   He does not check BP home, today their BP  is BP: (!) 151/92 Similar on manual recheck. He admits he just had 2 cups of coffee and attributes elevated BP to this. He sometimes forgets to take his BP medications.  He does not workout but is  active in the yard and his community. He does note occasional short very slight pain in the chest area with mild SOB/DOE when completing a strenuous activity, however.  The pain goes away instantaneously.  He contributes this to GERD.  He is not on a daily H2 blocker.    He is not on cholesterol medication and denies myalgias. His LDL cholesterol is at goal, trigs remain elevated. The cholesterol last visit was:   Lab Results  Component Value Date   CHOL 128 12/02/2019   HDL 31 (L) 12/02/2019   LDLCALC 76 12/02/2019   TRIG 127 12/02/2019   CHOLHDL 4.1 12/02/2019   He has been working on diet and exercise for glucose management, and denies increased appetite, nausea, paresthesia of the feet, polydipsia and polyuria. Last A1C in the office was:  Lab Results  Component Value Date   HGBA1C 4.8 12/02/2019   Last GFR Lab Results  Component Value Date   GFRNONAA 80 12/02/2019    Patient is on Vitamin D supplement.   Lab Results  Component Value Date   VD25OH 38 12/02/2019      Medication Review:  Current Outpatient Medications (Endocrine & Metabolic):    dexamethasone (DECADRON) 4 MG tablet, Take 1 tab 3 x day - 3 days, then 2 x day - 3 days, then 1 tab daily  Current Outpatient Medications (Cardiovascular):    bisoprolol-hydrochlorothiazide (ZIAC) 10-6.25 MG tablet, TAKE 1 TABLET DAILY FOR BLOOD PRESSURE   lisinopril (ZESTRIL) 40 MG tablet, TAKE 1 TABLET DAILY FOR BLOOD PRESSURE   tadalafil (CIALIS) 20 MG tablet, Take 1/2 to 1 tablet every 2 to 3 days as needed for XXXX   Current Outpatient Medications (Analgesics):    aspirin EC 81 MG tablet, Takes 1 tablet Daily   Current Outpatient Medications (Other):    CHOLECALCIFEROL PO, Take 5,000 Units by mouth daily.    Zinc 50 MG TABS, Take 1 tablet Daily   azithromycin (ZITHROMAX) 250 MG tablet, Take 2 tablets with Food on  Day 1, then 1 tablet Daily with Food for Infection  Allergies: No Known Allergies  Current Problems  (verified) has Hyperlipidemia; Hypertension; Other abnormal glucose; Vitamin D deficiency; Medication management; Obesity (BMI 30.0-34.9); Erectile dysfunction; and Former smoker on their problem list.  Screening Tests Immunization History  Administered Date(s) Administered   Influenza Inj Mdck Quad With Preservative 01/15/2018, 01/31/2019   PPD Test 07/31/2014   Td 11/12/2001   Tdap 03/15/2011    Preventative care: Last colonoscopy: 08/2008, due 2020 EGD: 08/2008  Prior vaccinations: TD or Tdap: 2013  Influenza: DUE  Pneumococcal: -  Prevnar13: Due at age 52 - Obtain at [facility] Shingles/Zostavax: n/a  Names of Other Physician/Practitioners you currently use: 1. [facility] here for primary care 2. Dr. Caryn Section, eye doctor, last visit 2017 - wears glasses. 3. Dr. Yehuda Mao, dentist, last visit 2022, goes q79m 4. [facility]  Patient Care Team: Lucky Cowboy, MD as PCP - General (Internal Medicine) Louis Meckel, MD (Inactive) as Consulting Physician (Gastroenterology)  Surgical: He  has a past surgical history that includes Vasectomy (2005) and Appendectomy (1974). Family His family history includes Heart disease in his father; Hypertension in his brother. Social history  He reports that he quit  smoking about 29 years ago. His smoking use included cigarettes. He started smoking about 48 years ago. He has a 43.50 pack-year smoking history. He has never used smokeless tobacco. He reports current alcohol use. He reports that he does not use drugs.  MEDICARE WELLNESS OBJECTIVES: Physical activity: Current Exercise Habits: The patient does not participate in regular exercise at present, Exercise limited by: orthopedic condition(s) Cardiac risk factors: Cardiac Risk Factors include: advanced age (>55men, >65 women);male gender;hypertension;obesity (BMI >30kg/m2) Depression/mood screen:      08/10/2021    1:32 PM  Depression screen PHQ 2/9   Decreased Interest 0  Down, Depressed, Hopeless 0  PHQ - 2 Score 0    ADLs:     08/10/2021    1:32 PM  In your present state of health, do you have any difficulty performing the following activities:  Hearing? 0  Vision? 0  Difficulty concentrating or making decisions? 0  Walking or climbing stairs? 0  Dressing or bathing? 0  Doing errands, shopping? 0  Preparing Food and eating ? N  Using the Toilet? N  In the past six months, have you accidently leaked urine? N  Do you have problems with loss of bowel control? N  Managing your Medications? N  Managing your Finances? N  Housekeeping or managing your Housekeeping? N     Cognitive Testing  Alert? Yes  Normal Appearance?Yes  Oriented to person? Yes  Place? Yes   Time? Yes  Recall of three objects?  Yes  Can perform simple calculations? Yes  Displays appropriate judgment?Yes  Can read the correct time from a watch face?Yes  EOL planning: Does Patient Have a Medical Advance Directive?: No Would patient like information on creating a medical advance directive?: No - Patient declined   Objective:   Today's Vitals   08/10/21 0930  BP: (!) 151/92  Pulse: 63  Temp: (!) 96.9 F (36.1 C)  SpO2: 96%  Weight: 222 lb (100.7 kg)   Body mass index is 32.78 kg/m.  General appearance: alert, no distress, WD/WN, male HEENT: normocephalic, sclerae anicteric, TMs pearly, nares patent, no discharge or erythema, pharynx normal Oral cavity: MMM, no lesions Neck: supple, no lymphadenopathy, no thyromegaly, no masses Heart: RRR, normal S1, S2, no murmurs Lungs: CTA bilaterally, no wheezes, rhonchi, or rales Abdomen: +bs, soft, non tender, non distended, no masses, no hepatomegaly, no splenomegaly Musculoskeletal: BLE LROM d/t pain.  nontender, no swelling, no obvious deformity Extremities: no edema, no cyanosis, no clubbing Pulses: 2+ symmetric, upper and lower extremities, normal cap refill Neurological: alert, oriented x 3,  CN2-12 intact, strength normal upper extremities and lower extremities, sensation normal throughout, DTRs diminished bilateral knees, no cerebellar signs, gait mildly antalgic Psychiatric: normal affect, behavior normal, pleasant   Medicare Attestation I have personally reviewed: The patient's medical and social history Their use of alcohol, tobacco or illicit drugs Their current medications and supplements The patient's functional ability including ADLs,fall risks, home safety risks, cognitive, and hearing and visual impairment Diet and physical activities Evidence for depression or mood disorders  The patient's weight, height, BMI, and visual acuity have been recorded in the chart.  I have made referrals, counseling, and provided education to the patient based on review of the above and I have provided the patient with a written personalized care plan for preventive services.     Yesmin Mutch, NP   08/10/2021  

## 2021-08-10 NOTE — Progress Notes (Deleted)
WELLCOME TO MEDICARE ANNUAL WELLNESS VISIT AND FOLLOW UP Assessment:      Over 30 minutes of exam, counseling, chart review, and critical decision making was performed  Future Appointments  Date Time Provider [place]  08/10/2021  9:30 AM Darrol Jump, NP [facility] None  08/15/2022  9:30 AM Darrol Jump, NP [facility] None     Plan:   During the course of the visit the patient was educated and counseled about appropriate screening and preventive services including:   Pneumococcal vaccine  Influenza vaccine Prevnar 13 Td vaccine Screening electrocardiogram Colorectal cancer screening Diabetes screening Glaucoma screening Nutrition counseling    Subjective:  Anthony Boone is a 66 y.o. male who presents for Medicare Annual Wellness Visit and *** month follow up. He has Hyperlipidemia; Hypertension; Other abnormal glucose; Vitamin D deficiency; Medication management; Obesity (BMI 30.0-34.9); Erectile dysfunction; and Former smoker on their problem list.   BMI is There is no height or weight on file to calculate BMI., he {HAS HAS YNW:29562} been working on diet and exercise. Wt Readings from Last 3 Encounters:  10/22/20 213 lb 6.4 oz (96.8 kg)  12/02/19 210 lb (95.3 kg)  05/14/19 229 lb (103.9 kg)     His blood pressure {HAS HAS NOT:18834} been controlled at home, today their BP is   He {DOES_DOES ZHY:86578} workout. He denies chest pain, shortness of breath, dizziness.  He {ACTION; IS/IS ION:62952841} on cholesterol medication and denies myalgias. His cholesterol {ACTION; IS/IS NOT:21021397} at goal. The cholesterol last visit was:   Lab Results  Component Value Date   CHOL 128 12/02/2019   HDL 31 (L) 12/02/2019   LDLCALC 76 12/02/2019   TRIG 127 12/02/2019   CHOLHDL 4.1 12/02/2019   He {Has/has not:18111} been working on diet and exercise for ***prediabetes, and denies {Symptoms; diabetes w/o none:19199}. Last A1C in the office was:  Lab Results   Component Value Date   HGBA1C 4.8 12/02/2019   Last GFR No results found for: EGFR Patient is on Vitamin D supplement.   Lab Results  Component Value Date   VD25OH 38 12/02/2019      Medication Review:  Current Outpatient Medications (Endocrine & Metabolic):    dexamethasone (DECADRON) 4 MG tablet, Take 1 tab 3 x day - 3 days, then 2 x day - 3 days, then 1 tab daily  Current Outpatient Medications (Cardiovascular):    bisoprolol-hydrochlorothiazide (ZIAC) 10-6.25 MG tablet, TAKE 1 TABLET DAILY FOR BLOOD PRESSURE   lisinopril (ZESTRIL) 40 MG tablet, TAKE 1 TABLET DAILY FOR BLOOD PRESSURE   tadalafil (CIALIS) 20 MG tablet, Take 1/2 to 1 tablet every 2 to 3 days as needed for XXXX   Current Outpatient Medications (Analgesics):    aspirin EC 81 MG tablet, Takes 1 tablet Daily   Current Outpatient Medications (Other):    azithromycin (ZITHROMAX) 250 MG tablet, Take 2 tablets with Food on  Day 1, then 1 tablet Daily with Food for Infection   CHOLECALCIFEROL PO, Take 5,000 Units by mouth daily.    Zinc 50 MG TABS, Take 1 tablet Daily  Allergies: No Known Allergies  Current Problems (verified) has Hyperlipidemia; Hypertension; Other abnormal glucose; Vitamin D deficiency; Medication management; Obesity (BMI 30.0-34.9); Erectile dysfunction; and Former smoker on their problem list.  Screening Tests Immunization History  Administered Date(s) Administered   Influenza Inj Mdck Quad With Preservative 01/15/2018, 01/31/2019   PPD Test 07/31/2014   Td 11/12/2001   Tdap 03/15/2011   Health Maintenance  Topic Date Due  COVID-19 Vaccine (1) Never done   Pneumonia Vaccine 36+ Years old (1 - PCV) Never done   Zoster Vaccines- Shingrix (1 of 2) Never done   COLONOSCOPY (Pts 45-38yr Insurance coverage will need to be confirmed)  09/09/2018   TETANUS/TDAP  03/14/2021   INFLUENZA VACCINE  10/12/2021   Hepatitis C Screening  Completed   HIV Screening  Completed   HPV VACCINES  Aged  Out   Patieint declines all vaccines at this time.  Education provided.    Last colonoscopy: 2010, [facility]  Names of Other Physician/Practitioners you currently use: 1. [facility] here for primary care 2. ***, eye doctor, last visit  3. ***, dentist, last visit Follows with Dr. DSaunders Revel4. ***, derm, last visit  Patient Care Team: MUnk Pinto MD as PCP - General (Internal Medicine) KInda Castle MD (Inactive) as Consulting Physician (Gastroenterology)  Surgical: He  has a past surgical history that includes Vasectomy (2005) and Appendectomy (1974). Family His family history includes Heart disease in his father; Hypertension in his brother. Social history  He reports that he quit smoking about 29 years ago. His smoking use included cigarettes. He started smoking about 48 years ago. He has a 43.50 pack-year smoking history. He has never used smokeless tobacco. He reports current alcohol use. He reports that he does not use drugs.  MEDICARE WELLNESS OBJECTIVES: Physical activity:   Cardiac risk factors:   Depression/mood screen:      12/01/2019    8:06 PM  Depression screen PHQ 2/9  Decreased Interest 1  Down, Depressed, Hopeless 0  PHQ - 2 Score 1    ADLs:      View : No data to display.           Cognitive Testing  Alert? Yes  Normal Appearance?Yes  Oriented to person? Yes  Place? Yes   Time? Yes  Recall of three objects?  Yes  Can perform simple calculations? Yes  Displays appropriate judgment?Yes  Can read the correct time from a watch face?Yes  EOL planning:     Objective:   There were no vitals filed for this visit. There is no height or weight on file to calculate BMI.  General appearance: alert, no distress, WD/WN, male HEENT: normocephalic, sclerae anicteric, TMs pearly, nares patent, no discharge or erythema, pharynx normal Oral cavity: MMM, no lesions Neck: supple, no lymphadenopathy, no thyromegaly,  no masses Heart: RRR, normal S1, S2, no murmurs Lungs: CTA bilaterally, no wheezes, rhonchi, or rales Abdomen: +bs, soft, non tender, non distended, no masses, no hepatomegaly, no splenomegaly Musculoskeletal: nontender, no swelling, no obvious deformity Extremities: no edema, no cyanosis, no clubbing Pulses: 2+ symmetric, upper and lower extremities, normal cap refill Neurological: alert, oriented x 3, CN2-12 intact, strength normal upper extremities and lower extremities, sensation normal throughout, DTRs 2+ throughout, no cerebellar signs, gait normal Psychiatric: normal affect, behavior normal, pleasant   EKG: Due Annual Physical [facility] [place] Due Annual Physical   Medicare Attestation I have personally reviewed: The patient's medical and social history Their use of alcohol, tobacco or illicit drugs Their current medications and supplements The patient's functional ability including ADLs,fall risks, home safety risks, cognitive, and hearing and visual impairment Diet and physical activities Evidence for depression or mood disorders  The patient's weight, height, BMI, and visual acuity have been recorded in the chart.  I have made referrals, counseling, and provided education to the patient based on review of the above and I  have provided the patient with a written personalized care plan for preventive services.     Darrol Jump, NP   08/10/2021

## 2021-08-10 NOTE — Progress Notes (Deleted)
MEDICARE ANNUAL WELLNESS VISIT AND FOLLOW UP Assessment:   [name] was seen today for follow-up and medicare wellness.  Diagnoses and all orders for this visit:  1. Encounter for Medicare annual wellness exam Due annually  2. Primary hypertension Controlled - Above Goal Today Continue medications; Bisoprolol-HCTZ, Lisinopril. Discussed DASH (Dietary Approaches to Stop Hypertension) DASH diet is lower in sodium than a typical American diet. Cut back on foods that are high in saturated fat, cholesterol, and trans fats. Eat more whole-grain foods, fish, poultry, and nuts Remain active and exercise as tolerated daily.  Monitor BP at home-Call if greater than 130/80.   - CBC with Differential/Platelet - COMPLETE METABOLIC PANEL WITH GFR  3. Hyperlipidemia, unspecified hyperlipidemia type Controlled Not on medications. Discussed lifestyle modifications. Recommended diet heavy in fruits and veggies, omega 3's. Decrease consumption of animal meats, cheeses, and dairy products. Remain active and exercise as tolerated. Continue to monitor.   - Lipid panel  4. Other abnormal glucose Continue lifestyle modifications.  - Hemoglobin A1c  5. Vitamin D deficiency At Goal Continue supplement. Sunlight can help convert compounds into Vitamin D. Continue to monitor.  - VITAMIN D 25 Hydroxy (Vit-D Deficiency, Fractures)  6. Obesity (BMI 30.0-34.9) Discussed appropriate BMI Goal of losing 1 lb per month. Diet modification. Physical activity. Encouraged/praised to build confidence.   - TSH  7. Erectile dysfunction, unspecified erectile dysfunction type Continue Tadalafil Medication effective.  8. Former smoker Continue to monitor.  9. Screening for hematuria or proteinuria  - Urinalysis, Routine w reflex microscopic  10. Medication management All medications reviewed in detail. All questions and concerns addressed.  - CBC with Differential/Platelet - COMPLETE METABOLIC  PANEL WITH GFR - Lipid panel - Hemoglobin A1c - VITAMIN D 25 Hydroxy (Vit-D Deficiency, Fractures) - TSH - Urinalysis, Routine w reflex microscopic  11. 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 Screening for colon cancer   - Ambulatory Referral to GI   Over 30 minutes of exam, counseling, chart review, and critical decision making was performed  Future Appointments  Date Time Provider Department Center  12/20/2021 10:00 AM Lucky Cowboy, MD [facility] None  08/15/2022  9:30 AM Adela Glimpse, NP [facility] None     Plan:   During the course of the visit the patient was educated and counseled about appropriate screening and preventive services including:   Pneumococcal vaccine  Influenza vaccine Prevnar 13 Td vaccine Screening electrocardiogram Colorectal cancer screening Diabetes screening Glaucoma screening Nutrition counseling    Subjective:  Anthony Boone is a 66 y.o. male who presents for Medicare Annual Wellness Visit and 3 month follow up for HTN, hyperlipidemia, glucose management, and vitamin D Def.   Patient had a work injury in Nov 2014 with a torn Lt Quadriceps operated by Dr Beverely Low and ultimately was approved SS Disability in Apr 2017 - predated to 2014.   Patient reports feeling well overall.  He continues to be active in the community.    He is requesting a refill for Cialis which he report is currently effective.    BMI is Body mass index is 32.78 kg/m., he has been working on diet and exercise.  He has gained 9 lb over the last 6 mo.   Wt Readings from Last 3 Encounters:  08/10/21 222 lb (100.7 kg)  10/22/20 213 lb 6.4 oz (96.8 kg)  12/02/19 210 lb (95.3 kg)   He does not check BP home, today their BP  is BP: (!) 151/92 Similar on manual recheck. He admits he just had 2 cups of coffee and attributes elevated BP to this. He sometimes forgets to take his BP medications.  He does not workout but is  active in the yard and his community. He does note occasional short very slight pain in the chest area with mild SOB/DOE when completing a strenuous activity, however.  The pain goes away instantaneously.  He contributes this to GERD.  He is not on a daily H2 blocker.    He is not on cholesterol medication and denies myalgias. His LDL cholesterol is at goal, trigs remain elevated. The cholesterol last visit was:   Lab Results  Component Value Date   CHOL 128 12/02/2019   HDL 31 (L) 12/02/2019   LDLCALC 76 12/02/2019   TRIG 127 12/02/2019   CHOLHDL 4.1 12/02/2019   He has been working on diet and exercise for glucose management, and denies increased appetite, nausea, paresthesia of the feet, polydipsia and polyuria. Last A1C in the office was:  Lab Results  Component Value Date   HGBA1C 4.8 12/02/2019   Last GFR Lab Results  Component Value Date   GFRNONAA 80 12/02/2019    Patient is on Vitamin D supplement.   Lab Results  Component Value Date   VD25OH 38 12/02/2019      Medication Review:  Current Outpatient Medications (Endocrine & Metabolic):    dexamethasone (DECADRON) 4 MG tablet, Take 1 tab 3 x day - 3 days, then 2 x day - 3 days, then 1 tab daily  Current Outpatient Medications (Cardiovascular):    bisoprolol-hydrochlorothiazide (ZIAC) 10-6.25 MG tablet, TAKE 1 TABLET DAILY FOR BLOOD PRESSURE   lisinopril (ZESTRIL) 40 MG tablet, TAKE 1 TABLET DAILY FOR BLOOD PRESSURE   tadalafil (CIALIS) 20 MG tablet, Take 1/2 to 1 tablet every 2 to 3 days as needed for XXXX   Current Outpatient Medications (Analgesics):    aspirin EC 81 MG tablet, Takes 1 tablet Daily   Current Outpatient Medications (Other):    CHOLECALCIFEROL PO, Take 5,000 Units by mouth daily.    Zinc 50 MG TABS, Take 1 tablet Daily   azithromycin (ZITHROMAX) 250 MG tablet, Take 2 tablets with Food on  Day 1, then 1 tablet Daily with Food for Infection  Allergies: No Known Allergies  Current Problems  (verified) has Hyperlipidemia; Hypertension; Other abnormal glucose; Vitamin D deficiency; Medication management; Obesity (BMI 30.0-34.9); Erectile dysfunction; and Former smoker on their problem list.  Screening Tests Immunization History  Administered Date(s) Administered   Influenza Inj Mdck Quad With Preservative 01/15/2018, 01/31/2019   PPD Test 07/31/2014   Td 11/12/2001   Tdap 03/15/2011    Preventative care: Last colonoscopy: 08/2008, due 2020 EGD: 08/2008  Prior vaccinations: TD or Tdap: 2013  Influenza: DUE  Pneumococcal: -  Prevnar13: Due at age 52 - Obtain at [facility] Shingles/Zostavax: n/a  Names of Other Physician/Practitioners you currently use: 1. [facility] here for primary care 2. Dr. Caryn Section, eye doctor, last visit 2017 - wears glasses. 3. Dr. Yehuda Mao, dentist, last visit 2022, goes q79m 4. [facility]  Patient Care Team: Lucky Cowboy, MD as PCP - General (Internal Medicine) Louis Meckel, MD (Inactive) as Consulting Physician (Gastroenterology)  Surgical: He  has a past surgical history that includes Vasectomy (2005) and Appendectomy (1974). Family His family history includes Heart disease in his father; Hypertension in his brother. Social history  He reports that he quit  smoking about 29 years ago. His smoking use included cigarettes. He started smoking about 48 years ago. He has a 43.50 pack-year smoking history. He has never used smokeless tobacco. He reports current alcohol use. He reports that he does not use drugs.  MEDICARE WELLNESS OBJECTIVES: Physical activity: Current Exercise Habits: The patient does not participate in regular exercise at present, Exercise limited by: orthopedic condition(s) Cardiac risk factors: Cardiac Risk Factors include: advanced age (>1755men, 64>65 women);male gender;hypertension;obesity (BMI >30kg/m2) Depression/mood screen:      08/10/2021    1:32 PM  Depression screen PHQ 2/9   Decreased Interest 0  Down, Depressed, Hopeless 0  PHQ - 2 Score 0    ADLs:     08/10/2021    1:32 PM  In your present state of health, do you have any difficulty performing the following activities:  Hearing? 0  Vision? 0  Difficulty concentrating or making decisions? 0  Walking or climbing stairs? 0  Dressing or bathing? 0  Doing errands, shopping? 0  Preparing Food and eating ? N  Using the Toilet? N  In the past six months, have you accidently leaked urine? N  Do you have problems with loss of bowel control? N  Managing your Medications? N  Managing your Finances? N  Housekeeping or managing your Housekeeping? N     Cognitive Testing  Alert? Yes  Normal Appearance?Yes  Oriented to person? Yes  Place? Yes   Time? Yes  Recall of three objects?  Yes  Can perform simple calculations? Yes  Displays appropriate judgment?Yes  Can read the correct time from a watch face?Yes  EOL planning: Does Patient Have a Medical Advance Directive?: No Would patient like information on creating a medical advance directive?: No - Patient declined   Objective:   Today's Vitals   08/10/21 0930  BP: (!) 151/92  Pulse: 63  Temp: (!) 96.9 F (36.1 C)  SpO2: 96%  Weight: 222 lb (100.7 kg)   Body mass index is 32.78 kg/m.  General appearance: alert, no distress, WD/WN, male HEENT: normocephalic, sclerae anicteric, TMs pearly, nares patent, no discharge or erythema, pharynx normal Oral cavity: MMM, no lesions Neck: supple, no lymphadenopathy, no thyromegaly, no masses Heart: RRR, normal S1, S2, no murmurs Lungs: CTA bilaterally, no wheezes, rhonchi, or rales Abdomen: +bs, soft, non tender, non distended, no masses, no hepatomegaly, no splenomegaly Musculoskeletal: BLE LROM d/t pain.  nontender, no swelling, no obvious deformity Extremities: no edema, no cyanosis, no clubbing Pulses: 2+ symmetric, upper and lower extremities, normal cap refill Neurological: alert, oriented x 3,  CN2-12 intact, strength normal upper extremities and lower extremities, sensation normal throughout, DTRs diminished bilateral knees, no cerebellar signs, gait mildly antalgic Psychiatric: normal affect, behavior normal, pleasant   Medicare Attestation I have personally reviewed: The patient's medical and social history Their use of alcohol, tobacco or illicit drugs Their current medications and supplements The patient's functional ability including ADLs,fall risks, home safety risks, cognitive, and hearing and visual impairment Diet and physical activities Evidence for depression or mood disorders  The patient's weight, height, BMI, and visual acuity have been recorded in the chart.  I have made referrals, counseling, and provided education to the patient based on review of the above and I have provided the patient with a written personalized care plan for preventive services.     Adela GlimpseNYA Taygen Acklin, NP   08/10/2021

## 2021-08-11 LAB — COMPLETE METABOLIC PANEL WITH GFR
AG Ratio: 1.9 (calc) (ref 1.0–2.5)
ALT: 17 U/L (ref 9–46)
AST: 17 U/L (ref 10–35)
Albumin: 4.6 g/dL (ref 3.6–5.1)
Alkaline phosphatase (APISO): 60 U/L (ref 35–144)
BUN: 13 mg/dL (ref 7–25)
CO2: 25 mmol/L (ref 20–32)
Calcium: 9.3 mg/dL (ref 8.6–10.3)
Chloride: 103 mmol/L (ref 98–110)
Creat: 1.17 mg/dL (ref 0.70–1.35)
Globulin: 2.4 g/dL (calc) (ref 1.9–3.7)
Glucose, Bld: 95 mg/dL (ref 65–99)
Potassium: 5.1 mmol/L (ref 3.5–5.3)
Sodium: 138 mmol/L (ref 135–146)
Total Bilirubin: 0.7 mg/dL (ref 0.2–1.2)
Total Protein: 7 g/dL (ref 6.1–8.1)
eGFR: 69 mL/min/{1.73_m2} (ref >=60)

## 2021-08-11 LAB — LIPID PANEL
Cholesterol: 153 mg/dL (ref <200)
HDL: 43 mg/dL (ref >=40)
LDL Cholesterol (Calc): 79 mg/dL (calc)
Non-HDL Cholesterol (Calc): 110 mg/dL (calc) (ref <130)
Total CHOL/HDL Ratio: 3.6 (calc) (ref <5.0)
Triglycerides: 217 mg/dL — ABNORMAL HIGH (ref <150)

## 2021-08-11 LAB — URINALYSIS, ROUTINE W REFLEX MICROSCOPIC
Bilirubin Urine: NEGATIVE
Glucose, UA: NEGATIVE
Hgb urine dipstick: NEGATIVE
Ketones, ur: NEGATIVE
Leukocytes,Ua: NEGATIVE
Nitrite: NEGATIVE
Protein, ur: NEGATIVE
Specific Gravity, Urine: 1.013 (ref 1.001–1.035)
pH: 5.5 (ref 5.0–8.0)

## 2021-08-11 LAB — CBC WITH DIFFERENTIAL/PLATELET
Absolute Monocytes: 432 cells/uL (ref 200–950)
Basophils Absolute: 38 cells/uL (ref 0–200)
Basophils Relative: 0.8 %
Eosinophils Absolute: 62 cells/uL (ref 15–500)
Eosinophils Relative: 1.3 %
HCT: 46 % (ref 38.5–50.0)
Hemoglobin: 16.1 g/dL (ref 13.2–17.1)
Lymphs Abs: 1685 cells/uL (ref 850–3900)
MCH: 30.6 pg (ref 27.0–33.0)
MCHC: 35 g/dL (ref 32.0–36.0)
MCV: 87.3 fL (ref 80.0–100.0)
MPV: 9.5 fL (ref 7.5–12.5)
Monocytes Relative: 9 %
Neutro Abs: 2582 cells/uL (ref 1500–7800)
Neutrophils Relative %: 53.8 %
Platelets: 129 10*3/uL — ABNORMAL LOW (ref 140–400)
RBC: 5.27 10*6/uL (ref 4.20–5.80)
RDW: 13.7 % (ref 11.0–15.0)
Total Lymphocyte: 35.1 %
WBC: 4.8 10*3/uL (ref 3.8–10.8)

## 2021-08-11 LAB — HEMOGLOBIN A1C
Hgb A1c MFr Bld: 5.2 % of total Hgb (ref <5.7)
Mean Plasma Glucose: 103 mg/dL
eAG (mmol/L): 5.7 mmol/L

## 2021-08-11 LAB — VITAMIN B12: Vitamin B-12: 223 pg/mL (ref 200–1100)

## 2021-08-11 LAB — TSH: TSH: 2.12 mIU/L (ref 0.40–4.50)

## 2021-08-11 LAB — TESTOSTERONE: Testosterone: 380 ng/dL (ref 250–827)

## 2021-08-11 LAB — VITAMIN D 25 HYDROXY (VIT D DEFICIENCY, FRACTURES): Vit D, 25-Hydroxy: 43 ng/mL (ref 30–100)

## 2021-10-04 DIAGNOSIS — M9903 Segmental and somatic dysfunction of lumbar region: Secondary | ICD-10-CM | POA: Diagnosis not present

## 2021-10-04 DIAGNOSIS — M47817 Spondylosis without myelopathy or radiculopathy, lumbosacral region: Secondary | ICD-10-CM | POA: Diagnosis not present

## 2021-10-06 DIAGNOSIS — M47817 Spondylosis without myelopathy or radiculopathy, lumbosacral region: Secondary | ICD-10-CM | POA: Diagnosis not present

## 2021-10-06 DIAGNOSIS — M9903 Segmental and somatic dysfunction of lumbar region: Secondary | ICD-10-CM | POA: Diagnosis not present

## 2021-10-13 DIAGNOSIS — M9903 Segmental and somatic dysfunction of lumbar region: Secondary | ICD-10-CM | POA: Diagnosis not present

## 2021-10-13 DIAGNOSIS — M47817 Spondylosis without myelopathy or radiculopathy, lumbosacral region: Secondary | ICD-10-CM | POA: Diagnosis not present

## 2021-10-15 ENCOUNTER — Other Ambulatory Visit: Payer: Self-pay | Admitting: Internal Medicine

## 2021-10-15 DIAGNOSIS — I10 Essential (primary) hypertension: Secondary | ICD-10-CM

## 2021-10-15 DIAGNOSIS — N529 Male erectile dysfunction, unspecified: Secondary | ICD-10-CM

## 2021-10-15 MED ORDER — BISOPROLOL-HYDROCHLOROTHIAZIDE 10-6.25 MG PO TABS: ORAL_TABLET | ORAL | 3 refills | Status: DC

## 2021-10-15 MED ORDER — LISINOPRIL 40 MG PO TABS: ORAL_TABLET | ORAL | 3 refills | Status: DC

## 2021-10-15 MED ORDER — TADALAFIL 20 MG PO TABS: ORAL_TABLET | ORAL | 1 refills | Status: DC

## 2021-10-19 DIAGNOSIS — M47817 Spondylosis without myelopathy or radiculopathy, lumbosacral region: Secondary | ICD-10-CM | POA: Diagnosis not present

## 2021-10-19 DIAGNOSIS — M9903 Segmental and somatic dysfunction of lumbar region: Secondary | ICD-10-CM | POA: Diagnosis not present

## 2021-10-26 DIAGNOSIS — M9903 Segmental and somatic dysfunction of lumbar region: Secondary | ICD-10-CM | POA: Diagnosis not present

## 2021-10-26 DIAGNOSIS — M47817 Spondylosis without myelopathy or radiculopathy, lumbosacral region: Secondary | ICD-10-CM | POA: Diagnosis not present

## 2021-11-24 ENCOUNTER — Encounter: Payer: Self-pay | Admitting: Nurse Practitioner

## 2021-12-19 ENCOUNTER — Encounter: Payer: Self-pay | Admitting: Internal Medicine

## 2021-12-19 NOTE — Patient Instructions (Signed)

## 2021-12-19 NOTE — Progress Notes (Unsigned)
Comprehensive Evaluation & Examination   Future Appointments  Date Time Provider Department  12/20/2021                 cpe 10:00 AM Unk Pinto, MD [facility]  08/15/2022                   wellness  9:30 AM Darrol Jump, NP [facility]  12/23/2022                cpe 10:00 AM Unk Pinto, MD [facility]        This very nice 66 y.o.  MWM  presents for a  comprehensive evaluation and management of multiple medical co-morbidities.  Patient has been followed for HTN, HLD,  Prediabetes, Vitamin B12 deficiency and Vitamin D Deficiency.        Patient has been on SS Disability since age 41 yo 2014 consequent of an injury related to a torn Lt Quadriceps on the job for [employer] & last Nov 2022  transitioned to regular Medicare. Anthony Boone Kitchen     HTN predates circa 2010. Patient's BP has been controlled and today's BP is                               .   Patient denies any cardiac symptoms as chest pain, palpitations, shortness of breath, dizziness or ankle swelling.     Patient's hyperlipidemia is controlled with diet. Last lipids were at goal except elevated Trig's:  Lab Results  Component Value Date   CHOL 153 08/10/2021   HDL 43 08/10/2021   LDLCALC 79 08/10/2021   TRIG 217 (H) 08/10/2021   CHOLHDL 3.6 08/10/2021        Patient has Moderate Obesity  (BMI 31+) and consequent  prediabetes (A1c 5.8% /2012)  and patient denies reactive hypoglycemic symptoms, visual blurring, diabetic polys or paresthesias. Last A1c was Normal & at goal:  Lab Results  Component Value Date   HGBA1C 5.2 08/10/2021        Finally, patient has history of Vitamin D Deficiency ("24" /2012) and last vitamin D was  low (goal 70-100):  Lab Results  Component Value Date   VD25OH 43 08/10/2021        Patient has hx/o multiple low Vitamin B12  in the past ("214" /2016  and "223" /May 2023) and he has been advised to take Vitamin Bq 12 supplements.    Current Outpatient Medications on File Prior to Visit   Medication Sig   bisoprolol-hctz 10-6.25 MG  Take 1 tablet    Daily    for BP   Vitamin D Take 5,000 Units by mouth daily.    lisinopril  40 MG tablet Take 1 tablet Daily for BP   pantoprazole  40 MG tablet Take 1 tablet (40 mg total) by mouth daily.   tadalafil  20 MG tablet Take 1/2 -1 tab every 2-3 days as needed for XXXX    No Known Allergies    Past Medical History:  Diagnosis Date   Hyperlipidemia    Hypertension    Vitamin D deficiency    Health Maintenance  Topic Date Due   COVID-19 Vaccine (1) Never done   COLONOSCOPY  09/09/2018   INFLUENZA VACCINE  10/13/2019   PNEUMOCOCCAL POLYSACCHARIDE VACCINE AGE 76-64 HIGH RISK  01/15/2021 (Originally 01/17/1958)   TETANUS/TDAP  03/14/2021   Hepatitis C Screening  Completed   HIV Screening  Completed  Immunization History  Administered Date(s) Administered   Influenza Inj Mdck Quad With Preservative 01/15/2018, 01/31/2019   PPD Test 07/31/2014   Td 11/12/2001   Tdap 03/15/2011   Last Colon -   09/08/2008 - Dr Arlyce Dice - hyperplastic polyp - 10 yr f/u overdue June 2020- He was sent a recall letter in May by Dr.Vito Cirigliano.  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 Does c/o fatigue. Eyes: Denies redness, blurred vision, diplopia, discharge, itchy or watery eyes.  ENT: Denies discharge, congestion, post nasal drip, epistaxis, sore throat, earache, hearing loss, dental pain, Tinnitus, Vertigo, Sinus pain or snoring.  Cardio: Denies chest pain, palpitations, irregular heartbeat, syncope, dyspnea, diaphoresis, orthopnea, PND, claudication or edema Respiratory: denies cough, dyspnea, DOE, pleurisy, hoarseness, laryngitis or wheezing.  Gastrointestinal: Denies dysphagia, heartburn, reflux, water brash, pain, cramps, nausea, vomiting, bloating, diarrhea, constipation, hematemesis, melena, hematochezia, jaundice or hemorrhoids Genitourinary: Denies dysuria, frequency, urgency, nocturia, hesitancy, discharge, hematuria or flank pain Musculoskeletal: Denies arthralgia, myalgia, stiffness, Jt. Swelling, pain, limp or strain/sprain. Denies Falls. Skin: Denies puritis, rash, hives, warts, acne, eczema or change in skin lesion Neuro: No weakness, tremor, incoordination, spasms, paresthesia or pain Psychiatric: Denies confusion, memory loss or sensory loss. Denies Depression. Endocrine: Denies change in weight, skin, hair change, nocturia, and paresthesia, diabetic polys, visual blurring or hyper / hypo glycemic episodes.  Heme/Lymph: No excessive bleeding, bruising or enlarged lymph nodes.  Physical Exam  There were no vitals taken for this visit.  General Appearance: over nourished and well groomed and in no apparent distress.  Eyes: PERRLA, EOMs, conjunctiva no swelling or erythema, normal fundi and vessels. Sinuses: No frontal/maxillary tenderness ENT/Mouth: EACs patent / TMs  nl. Nares clear without erythema, swelling, mucoid exudates. Oral hygiene is good. No erythema, swelling, or exudate. Tongue normal, non-obstructing. Tonsils not swollen or erythematous. Hearing normal.  Neck:  Supple, thyroid not palpable. No bruits, nodes or JVD. Respiratory: Respiratory effort normal.  BS equal and clear bilateral without rales, rhonci, wheezing or stridor. Cardio: Heart sounds are normal with regular rate and rhythm and no murmurs, rubs or gallops. Peripheral pulses are normal and equal bilaterally without edema. No aortic or femoral bruits. Chest: symmetric with normal excursions and percussion.  Abdomen: Soft, with Nl bowel sounds. Nontender, no guarding, rebound, hernias, masses, or organomegaly.  Lymphatics: Non tender without lymphadenopathy.  Musculoskeletal: Full ROM all peripheral extremities, joint stability, 5/5 strength, and normal gait. Skin: Warm and dry without  rashes, lesions, cyanosis, clubbing or  ecchymosis.  Neuro: Cranial nerves intact, reflexes equal bilaterally. Normal muscle tone, no cerebellar symptoms. Sensation intact.  Pysch: Alert and oriented X 3 with normal affect, insight and judgment appropriate.   Assessment and Plan   1. Essential hypertension  - EKG 12-Lead - Urinalysis, Routine w reflex microscopic - Microalbumin / creatinine urine ratio - CBC with Differential/Platelet - COMPLETE METABOLIC PANEL WITH GFR - Magnesium - TSH  2. Hyperlipidemia, mixed  - EKG 12-Lead - Lipid panel - TSH  3. Abnormal glucose  - EKG 12-Lead - Hemoglobin A1c - Insulin, random  4. Vitamin D deficiency   5. Prediabetes  - EKG 12-Lead - Hemoglobin A1c - Insulin, random  6. B12 deficiency  - Vitamin B12 - CBC with Differential/Platelet  7. Hyperplasia of prostate with lower urinary tract symptoms (LUTS)  - PSA  8. Screening for colorectal cancer  - POC Hemoccult Bld/Stl  9. Prostate cancer screening  - PSA  10. Screening for heart disease  - EKG 12-Lead  11. Former smoker  - EKG 12-Lead  12. Screening for AAA (aortic abdominal aneurysm)   13. Medication management  - Urinalysis, Routine w reflex microscopic -  Microalbumin / creatinine urine ratio - CBC with Differential/Platelet - COMPLETE METABOLIC PANEL WITH GFR - Magnesium - Lipid panel - TSH - Hemoglobin A1c - Insulin, random          Patient was counseled in prudent diet, weight control to achieve/maintain BMI less than 25, BP monitoring, regular exercise and medications as discussed.  Discussed med effects and SE's. Routine screening labs and tests as requested with regular follow-up as recommended. Over 40 minutes of exam, counseling, chart review and high complex critical decision making was performed   Marinus Maw, MD

## 2021-12-20 ENCOUNTER — Encounter: Payer: Self-pay | Admitting: Internal Medicine

## 2021-12-20 ENCOUNTER — Ambulatory Visit (INDEPENDENT_AMBULATORY_CARE_PROVIDER_SITE_OTHER): Payer: Medicare Other | Admitting: Internal Medicine

## 2021-12-20 VITALS — BP 136/86 | HR 66 | Temp 97.9°F | Resp 16 | Ht 69.0 in | Wt 226.0 lb

## 2021-12-20 DIAGNOSIS — Z136 Encounter for screening for cardiovascular disorders: Secondary | ICD-10-CM | POA: Diagnosis not present

## 2021-12-20 DIAGNOSIS — Z125 Encounter for screening for malignant neoplasm of prostate: Secondary | ICD-10-CM

## 2021-12-20 DIAGNOSIS — Z79899 Other long term (current) drug therapy: Secondary | ICD-10-CM | POA: Diagnosis not present

## 2021-12-20 DIAGNOSIS — N138 Other obstructive and reflux uropathy: Secondary | ICD-10-CM

## 2021-12-20 DIAGNOSIS — N401 Enlarged prostate with lower urinary tract symptoms: Secondary | ICD-10-CM | POA: Diagnosis not present

## 2021-12-20 DIAGNOSIS — Z87891 Personal history of nicotine dependence: Secondary | ICD-10-CM

## 2021-12-20 DIAGNOSIS — E782 Mixed hyperlipidemia: Secondary | ICD-10-CM

## 2021-12-20 DIAGNOSIS — Z1211 Encounter for screening for malignant neoplasm of colon: Secondary | ICD-10-CM

## 2021-12-20 DIAGNOSIS — E538 Deficiency of other specified B group vitamins: Secondary | ICD-10-CM

## 2021-12-20 DIAGNOSIS — E559 Vitamin D deficiency, unspecified: Secondary | ICD-10-CM

## 2021-12-20 DIAGNOSIS — R7309 Other abnormal glucose: Secondary | ICD-10-CM

## 2021-12-20 DIAGNOSIS — I10 Essential (primary) hypertension: Secondary | ICD-10-CM

## 2021-12-20 DIAGNOSIS — R7303 Prediabetes: Secondary | ICD-10-CM | POA: Diagnosis not present

## 2021-12-21 LAB — URINALYSIS, ROUTINE W REFLEX MICROSCOPIC
Bilirubin Urine: NEGATIVE
Glucose, UA: NEGATIVE
Hgb urine dipstick: NEGATIVE
Ketones, ur: NEGATIVE
Leukocytes,Ua: NEGATIVE
Nitrite: NEGATIVE
Protein, ur: NEGATIVE
Specific Gravity, Urine: 1.014 (ref 1.001–1.035)
pH: 6 (ref 5.0–8.0)

## 2021-12-21 LAB — LIPID PANEL
Cholesterol: 174 mg/dL (ref <200)
HDL: 41 mg/dL (ref >=40)
LDL Cholesterol (Calc): 95 mg/dL (calc)
Non-HDL Cholesterol (Calc): 133 mg/dL (calc) — ABNORMAL HIGH (ref <130)
Total CHOL/HDL Ratio: 4.2 (calc) (ref <5.0)
Triglycerides: 264 mg/dL — ABNORMAL HIGH (ref <150)

## 2021-12-21 LAB — CBC WITH DIFFERENTIAL/PLATELET
Absolute Monocytes: 421 cells/uL (ref 200–950)
Basophils Absolute: 43 cells/uL (ref 0–200)
Basophils Relative: 0.7 %
Eosinophils Absolute: 67 cells/uL (ref 15–500)
Eosinophils Relative: 1.1 %
HCT: 49 % (ref 38.5–50.0)
Hemoglobin: 16.9 g/dL (ref 13.2–17.1)
Lymphs Abs: 2208 cells/uL (ref 850–3900)
MCH: 30.3 pg (ref 27.0–33.0)
MCHC: 34.5 g/dL (ref 32.0–36.0)
MCV: 88 fL (ref 80.0–100.0)
MPV: 9.2 fL (ref 7.5–12.5)
Monocytes Relative: 6.9 %
Neutro Abs: 3361 cells/uL (ref 1500–7800)
Neutrophils Relative %: 55.1 %
Platelets: 157 10*3/uL (ref 140–400)
RBC: 5.57 10*6/uL (ref 4.20–5.80)
RDW: 13.4 % (ref 11.0–15.0)
Total Lymphocyte: 36.2 %
WBC: 6.1 10*3/uL (ref 3.8–10.8)

## 2021-12-21 LAB — COMPLETE METABOLIC PANEL WITH GFR
AG Ratio: 2.1 (calc) (ref 1.0–2.5)
ALT: 25 U/L (ref 9–46)
AST: 21 U/L (ref 10–35)
Albumin: 5 g/dL (ref 3.6–5.1)
Alkaline phosphatase (APISO): 56 U/L (ref 35–144)
BUN: 21 mg/dL (ref 7–25)
CO2: 28 mmol/L (ref 20–32)
Calcium: 9.7 mg/dL (ref 8.6–10.3)
Chloride: 101 mmol/L (ref 98–110)
Creat: 0.98 mg/dL (ref 0.70–1.35)
Globulin: 2.4 g/dL (calc) (ref 1.9–3.7)
Glucose, Bld: 92 mg/dL (ref 65–99)
Potassium: 4.5 mmol/L (ref 3.5–5.3)
Sodium: 139 mmol/L (ref 135–146)
Total Bilirubin: 0.8 mg/dL (ref 0.2–1.2)
Total Protein: 7.4 g/dL (ref 6.1–8.1)
eGFR: 86 mL/min/{1.73_m2} (ref >=60)

## 2021-12-21 LAB — MICROALBUMIN / CREATININE URINE RATIO
Creatinine, Urine: 60 mg/dL (ref 20–320)
Microalb Creat Ratio: 73 mcg/mg creat — ABNORMAL HIGH (ref <30)
Microalb, Ur: 4.4 mg/dL

## 2021-12-21 LAB — HEMOGLOBIN A1C
Hgb A1c MFr Bld: 5.4 % of total Hgb (ref <5.7)
Mean Plasma Glucose: 108 mg/dL
eAG (mmol/L): 6 mmol/L

## 2021-12-21 LAB — VITAMIN B12: Vitamin B-12: 228 pg/mL (ref 200–1100)

## 2021-12-21 LAB — PSA: PSA: 0.56 ng/mL (ref <=4.00)

## 2021-12-21 LAB — TSH: TSH: 2.61 mIU/L (ref 0.40–4.50)

## 2021-12-21 LAB — INSULIN, RANDOM: Insulin: 19.2 u[IU]/mL — ABNORMAL HIGH

## 2021-12-21 LAB — MAGNESIUM: Magnesium: 2 mg/dL (ref 1.5–2.5)

## 2021-12-21 NOTE — Progress Notes (Signed)
<><><><><><><><><><><><><><><><><><><><><><><><><><><><><><><><><> <><><><><><><><><><><><><><><><><><><><><><><><><><><><><><><><><>  -   Total Chol = 174   &  LDL Chol = 95     -   both  Excellent   - Very low risk for Heart Attack  / Stroke <><><><><><><><><><><><><><><><><><><><><><><><><><><><><><><><><> <><><><><><><><><><><><><><><><><><><><><><><><><><><><><><><><><>  -   -  Vitamin B12 =  228 is VERY VERY    Low  !  !  !                                    ( Ideal or Goal Vit B12 is between 450 - 1,100 )   Low Vit B12 may be associated with Anemia , Fatigue,   Peripheral Neuropathy, Dementia, "Brain Fog", & Depression   - Recommend take a sub-lingual form of Vitamin B12 tablet   1,000 to 5,000 mcg tab that you dissolve under your tongue /Daily   - Can get Baron Sane - best price at [employer] or on [employer]  <><><><><><><><><><><><><><><><><><><><><><><><><><><><><><><><><> <><><><><><><><><><><><><><><><><><><><><><><><><><><><><><><><><>  -  PSA - Low  - Great  <><><><><><><><><><><><><><><><><><><><><><><><><><><><><><><><><> <><><><><><><><><><><><><><><><><><><><><><><><><><><><><><><><><>  -  A1c - Normal - No Diabetes  - Great  ! <><><><><><><><><><><><><><><><><><><><><><><><><><><><><><><><><> <><><><><><><><><><><><><><><><><><><><><><><><><><><><><><><><><>  -  All Else - CBC - Kidneys - Electrolytes - Liver - Magnesium & Thyroid    - all  Normal / OK <><><><><><><><><><><><><><><><><><><><><><><><><><><><><><><><><> <><><><><><><><><><><><><><><><><><><><><><><><><><><><><><><><><>

## 2022-08-15 ENCOUNTER — Ambulatory Visit (INDEPENDENT_AMBULATORY_CARE_PROVIDER_SITE_OTHER): Payer: Medicare Other | Admitting: Nurse Practitioner

## 2022-08-15 ENCOUNTER — Encounter: Payer: Self-pay | Admitting: Nurse Practitioner

## 2022-08-15 VITALS — BP 130/74 | HR 65 | Temp 97.9°F | Ht 69.0 in | Wt 216.8 lb

## 2022-08-15 DIAGNOSIS — E559 Vitamin D deficiency, unspecified: Secondary | ICD-10-CM

## 2022-08-15 DIAGNOSIS — I10 Essential (primary) hypertension: Secondary | ICD-10-CM

## 2022-08-15 DIAGNOSIS — Z0001 Encounter for general adult medical examination with abnormal findings: Secondary | ICD-10-CM

## 2022-08-15 DIAGNOSIS — R7303 Prediabetes: Secondary | ICD-10-CM

## 2022-08-15 DIAGNOSIS — E669 Obesity, unspecified: Secondary | ICD-10-CM | POA: Diagnosis not present

## 2022-08-15 DIAGNOSIS — N529 Male erectile dysfunction, unspecified: Secondary | ICD-10-CM | POA: Diagnosis not present

## 2022-08-15 DIAGNOSIS — Z87891 Personal history of nicotine dependence: Secondary | ICD-10-CM

## 2022-08-15 DIAGNOSIS — E782 Mixed hyperlipidemia: Secondary | ICD-10-CM

## 2022-08-15 DIAGNOSIS — N138 Other obstructive and reflux uropathy: Secondary | ICD-10-CM | POA: Diagnosis not present

## 2022-08-15 DIAGNOSIS — R6889 Other general symptoms and signs: Secondary | ICD-10-CM | POA: Diagnosis not present

## 2022-08-15 DIAGNOSIS — Z79899 Other long term (current) drug therapy: Secondary | ICD-10-CM

## 2022-08-15 DIAGNOSIS — Z1211 Encounter for screening for malignant neoplasm of colon: Secondary | ICD-10-CM

## 2022-08-15 DIAGNOSIS — R7309 Other abnormal glucose: Secondary | ICD-10-CM

## 2022-08-15 DIAGNOSIS — N401 Enlarged prostate with lower urinary tract symptoms: Secondary | ICD-10-CM | POA: Diagnosis not present

## 2022-08-15 DIAGNOSIS — Z0000 Encounter for general adult medical examination without abnormal findings: Secondary | ICD-10-CM

## 2022-08-15 MED ORDER — SILDENAFIL CITRATE 50 MG PO TABS: 50.0000 mg | ORAL_TABLET | ORAL | 1 refills | Status: DC | PRN

## 2022-08-15 NOTE — Patient Instructions (Signed)

## 2022-08-15 NOTE — Progress Notes (Signed)
MEDICARE ANNUAL WELLNESS VISIT AND FOLLOW UP Assessment:   Anthony Boone was seen today for follow-up and medicare wellness.  Diagnoses and all orders for this visit:  Encounter for Medicare annual wellness exam  Essential hypertension Continue Bisoprolol HCTZ and Lisinopril Discussed DASH (Dietary Approaches to Stop Hypertension) DASH diet is lower in sodium than a typical American diet. Cut back on foods that are high in saturated fat, cholesterol, and trans fats. Eat more whole-grain foods, fish, poultry, and nuts Remain active and exercise as tolerated daily.  Monitor BP at home-Call if greater than 130/80.  Check CMP/CBC   Vitamin D deficiency -     VITAMIN D 25 Hydroxy (Vit-D Deficiency, Fractures)  Other abnormal glucose Education: Reviewed 'ABCs' of diabetes management  Discussed goals to be met and/or maintained include A1C (<7) Blood pressure (<130/80) Cholesterol (LDL <70) Continue Eye Exam yearly  Continue Dental Exam Q6 mo Discussed dietary recommendations Discussed Physical Activity recommendations Check A1C  Medication management All medications discussed and reviewed in full. All questions and concerns regarding medications addressed.    Hyperlipidemia, unspecified hyperlipidemia type Discussed lifestyle modifications. Recommended diet heavy in fruits and veggies, omega 3's. Decrease consumption of animal meats, cheeses, and dairy products. Remain active and exercise as tolerated. Continue to monitor. Check lipids/TSH  Obesity (BMI 30.0-34.9) Discussed appropriate BMI Diet modification. Physical activity. Encouraged/praised to build confidence.  Erectile dysfunction, unspecified erectile dysfunction type Unresponsive to Cialis - Switch to Viagra  Continue Zinc supplement  BPH Symptoms well controlled Continue to monitor  Former Smoker  Quit 1994 - respiratory stable  Screening for colorectal cancer Plans to schedule with [place] Discussed  importance of colorectal cancer screening  Orders Placed This Encounter  Procedures   CBC with Differential/Platelet   COMPLETE METABOLIC PANEL WITH GFR   Lipid panel   Hemoglobin A1c   VITAMIN D 25 Hydroxy (Vit-D Deficiency, Fractures)    Notify office for further evaluation and treatment, questions or concerns if any reported s/s fail to improve.   The patient was advised to call back or seek an in-person evaluation if any symptoms worsen or if the condition fails to improve as anticipated.   Further disposition pending results of labs. Discussed med's effects and SE's.    I discussed the assessment and treatment plan with the patient. The patient was provided an opportunity to ask questions and all were answered. The patient agreed with the plan and demonstrated an understanding of the instructions.  Discussed med's effects and SE's. Screening labs and tests as requested with regular follow-up as recommended.  I provided 35 minutes of face-to-face time during this encounter including counseling, chart review, and critical decision making was preformed.  Today's Plan of Care is based on a patient-centered health care approach known as shared decision making - the decisions, tests and treatments allow for patient preferences and values to be balanced with clinical evidence.      Future Appointments  Date Time Provider Department Center  12/23/2022 10:00 AM Lucky Cowboy, MD [facility] None     Plan:   During the course of the visit the patient was educated and counseled about appropriate screening and preventive services including:   Pneumococcal vaccine  Influenza vaccine Prevnar 13 Td vaccine Screening electrocardiogram Colorectal cancer screening Diabetes screening Glaucoma screening Nutrition counseling    Subjective:  Anthony Boone is a 67 y.o. male who presents for Medicare Annual Wellness Visit and 3 month follow up for HTN, hyperlipidemia, glucose  management, and vitamin D  Def.   Overall he reports feeling well today.  He has just returned from a family cruise to the [place].   Patient had a work injury in Nov 2014 with a torn Lt Quadriceps operated by Dr Beverely Low and ultimately was approved SS Disability in Apr 2017 - predated to 2014.   BMI is Body mass index is 32.02 kg/m., he has been working on diet and exercise. Wt Readings from Last 3 Encounters:  08/15/22 216 lb 12.8 oz (98.3 kg)  12/20/21 226 lb (102.5 kg)  08/10/21 222 lb (100.7 kg)   He does not check BP home, today their BP is BP: 130/74 Similar on manual recheck. He admits to forgetting to take BP meds daily, takes 2-3 times a week.  Feels as though overall BP is well controlled an averages <130/80 when checked at home.  He does not workout. He denies chest pain, shortness of breath, dizziness.   He is not on cholesterol medication and denies myalgias. His LDL cholesterol is at goal, trigs remain elevated. The cholesterol last visit was:   Lab Results  Component Value Date   CHOL 174 12/20/2021   HDL 41 12/20/2021   LDLCALC 95 12/20/2021   TRIG 264 (H) 12/20/2021   CHOLHDL 4.2 12/20/2021   He has been working on diet and exercise for glucose management, and denies increased appetite, nausea, paresthesia of the feet, polydipsia and polyuria. Last A1C in the office was:  Lab Results  Component Value Date   HGBA1C 5.4 12/20/2021   Last GFR Lab Results  Component Value Date   GFRNONAA 80 12/02/2019    Patient is on Vitamin D supplement.   Lab Results  Component Value Date   VD25OH 43 08/10/2021      Medication Review:   Current Outpatient Medications (Cardiovascular):    bisoprolol-hydrochlorothiazide (ZIAC) 10-6.25 MG tablet, Take  1 tablet  Daily   for BP   lisinopril (ZESTRIL) 40 MG tablet, Take  1 tablet   Daily   for BP   sildenafil (VIAGRA) 50 MG tablet, Take 1 tablet (50 mg total) by mouth as needed for erectile dysfunction. Take 1 hour  before sexual intercourse.   Current Outpatient Medications (Analgesics):    aspirin EC 81 MG tablet, Takes 1 tablet Daily (Patient not taking: Reported on 08/15/2022)   Current Outpatient Medications (Other):    CHOLECALCIFEROL PO, Take 5,000 Units by mouth daily.  (Patient not taking: Reported on 08/15/2022)   Zinc 50 MG TABS, Take 1 tablet Daily (Patient not taking: Reported on 08/15/2022)  Allergies: No Known Allergies  Current Problems (verified) has Hyperlipidemia; Hypertension; Other abnormal glucose; Vitamin D deficiency; Medication management; Obesity (BMI 30.0-34.9); Erectile dysfunction; and Former smoker on their problem list.  Screening Tests Immunization History  Administered Date(s) Administered   Influenza Inj Mdck Quad With Preservative 01/15/2018, 01/31/2019   PPD Test 07/31/2014   Td 11/12/2001   Tdap 03/15/2011    Preventative care: Last colonoscopy: 08/2008, due 2020 - patient plans to reach out.  EGD: 08/2008  Prior vaccinations: TD or Tdap: 2013  Influenza: 01/2019  Pneumococcal: - Prevnar13: Due Shingles/Zostavax: n/a  Names of Other Physician/Practitioners you currently use: 1.  [facility] here for primary care 2. Dr. Caryn Section, eye doctor, last visit 2023 3. Dr. Yehuda Mao, dentist, last visit 2023, goes q16m  Patient Care Team: Lucky Cowboy, MD as PCP - General (Internal Medicine) Louis Meckel, MD (Inactive) as Consulting Physician (Gastroenterology)  Surgical: He  has a past surgical history that includes Vasectomy (2005) and Appendectomy (1974). Family His family history includes Heart disease in his father; Hypertension in his brother. Social history  He reports that he quit smoking about 30 years ago. His smoking use included cigarettes. He started smoking about 49 years ago. He has a 43.50 pack-year smoking history. He has never used smokeless tobacco. He reports current alcohol use. He reports that he does  not use drugs.  MEDICARE WELLNESS OBJECTIVES: Physical activity:   Cardiac risk factors:   Depression/mood screen:      08/15/2022   11:24 AM  Depression screen PHQ 2/9  Decreased Interest 0  Down, Depressed, Hopeless 0  PHQ - 2 Score 0    ADLs:     08/15/2022   11:24 AM 12/19/2021    8:42 PM  In your present state of health, do you have any difficulty performing the following activities:  Hearing? 0 0  Vision? 0 0  Difficulty concentrating or making decisions? 0 0  Walking or climbing stairs? 0 0  Dressing or bathing? 0 0  Doing errands, shopping? 0 0     Cognitive Testing  Alert? Yes  Normal Appearance?Yes  Oriented to person? Yes  Place? Yes   Time? Yes  Recall of three objects?  Yes  Can perform simple calculations? Yes  Displays appropriate judgment?Yes  Can read the correct time from a watch face?Yes  EOL planning: Does Patient Have a Medical Advance Directive?: Yes Type of Advance Directive: Living will   Objective:   Today's Vitals   08/15/22 0919  BP: 130/74  Pulse: 65  Temp: 97.9 F (36.6 C)  SpO2: 95%  Weight: 216 lb 12.8 oz (98.3 kg)  Height: 5\' 9"  (1.753 m)    Body mass index is 32.02 kg/m.  General appearance: alert, no distress, WD/WN, male HEENT: normocephalic, sclerae anicteric, TMs pearly, nares patent, no discharge or erythema, pharynx normal Oral cavity: MMM, no lesions Neck: supple, no lymphadenopathy, no thyromegaly, no masses Heart: RRR, normal S1, S2, no murmurs Lungs: CTA bilaterally, no wheezes, rhonchi, or rales Abdomen: +bs, soft, non tender, non distended, no masses, no hepatomegaly, no splenomegaly Musculoskeletal: nontender, no swelling, no obvious deformity Extremities: no edema, no cyanosis, no clubbing Pulses: 2+ symmetric, upper and lower extremities, normal cap refill Neurological: alert, oriented x 3, CN2-12 intact, strength normal upper extremities and lower extremities, sensation normal throughout, DTRs diminished  bilateral knees, no cerebellar signs, gait mildly antalgic Psychiatric: normal affect, behavior normal, pleasant   Medicare Attestation I have personally reviewed: The patient's medical and social history Their use of alcohol, tobacco or illicit drugs Their current medications and supplements The patient's functional ability including ADLs,fall risks, home safety risks, cognitive, and hearing and visual impairment Diet and physical activities Evidence for depression or mood disorders  The patient's weight, height, BMI, and visual acuity have been recorded in the chart.  I have made referrals, counseling, and provided education to the patient based on review of the above and I have provided the patient with a written personalized care plan for preventive services.     Adela Glimpse, NP   08/15/2022

## 2022-08-16 LAB — CBC WITH DIFFERENTIAL/PLATELET
Absolute Monocytes: 451 cells/uL (ref 200–950)
Basophils Absolute: 39 cells/uL (ref 0–200)
Basophils Relative: 0.7 %
Eosinophils Absolute: 39 cells/uL (ref 15–500)
Eosinophils Relative: 0.7 %
HCT: 47.3 % (ref 38.5–50.0)
Hemoglobin: 15.9 g/dL (ref 13.2–17.1)
Lymphs Abs: 1260 cells/uL (ref 850–3900)
MCH: 29.7 pg (ref 27.0–33.0)
MCHC: 33.6 g/dL (ref 32.0–36.0)
MCV: 88.4 fL (ref 80.0–100.0)
MPV: 9.3 fL (ref 7.5–12.5)
Monocytes Relative: 8.2 %
Neutro Abs: 3713 cells/uL (ref 1500–7800)
Neutrophils Relative %: 67.5 %
Platelets: 147 10*3/uL (ref 140–400)
RBC: 5.35 10*6/uL (ref 4.20–5.80)
RDW: 13.9 % (ref 11.0–15.0)
Total Lymphocyte: 22.9 %
WBC: 5.5 10*3/uL (ref 3.8–10.8)

## 2022-08-16 LAB — COMPLETE METABOLIC PANEL WITH GFR
AG Ratio: 2.2 (calc) (ref 1.0–2.5)
ALT: 19 U/L (ref 9–46)
AST: 18 U/L (ref 10–35)
Albumin: 4.8 g/dL (ref 3.6–5.1)
Alkaline phosphatase (APISO): 66 U/L (ref 35–144)
BUN: 15 mg/dL (ref 7–25)
CO2: 26 mmol/L (ref 20–32)
Calcium: 9.4 mg/dL (ref 8.6–10.3)
Chloride: 105 mmol/L (ref 98–110)
Creat: 0.93 mg/dL (ref 0.70–1.35)
Globulin: 2.2 g/dL (calc) (ref 1.9–3.7)
Glucose, Bld: 96 mg/dL (ref 65–99)
Potassium: 4.7 mmol/L (ref 3.5–5.3)
Sodium: 140 mmol/L (ref 135–146)
Total Bilirubin: 0.6 mg/dL (ref 0.2–1.2)
Total Protein: 7 g/dL (ref 6.1–8.1)
eGFR: 91 mL/min/{1.73_m2} (ref >=60)

## 2022-08-16 LAB — LIPID PANEL
Cholesterol: 145 mg/dL (ref <200)
HDL: 42 mg/dL (ref >=40)
LDL Cholesterol (Calc): 79 mg/dL (calc)
Non-HDL Cholesterol (Calc): 103 mg/dL (calc) (ref <130)
Total CHOL/HDL Ratio: 3.5 (calc) (ref <5.0)
Triglycerides: 147 mg/dL (ref <150)

## 2022-08-16 LAB — HEMOGLOBIN A1C
Hgb A1c MFr Bld: 5.3 % of total Hgb (ref <5.7)
Mean Plasma Glucose: 105 mg/dL
eAG (mmol/L): 5.8 mmol/L

## 2022-08-16 LAB — VITAMIN D 25 HYDROXY (VIT D DEFICIENCY, FRACTURES): Vit D, 25-Hydroxy: 38 ng/mL (ref 30–100)

## 2022-08-17 NOTE — Progress Notes (Signed)
Patient is aware of lab results and states that he will start taking Vitamin D. -e welch

## 2022-09-21 DIAGNOSIS — L820 Inflamed seborrheic keratosis: Secondary | ICD-10-CM | POA: Diagnosis not present

## 2022-09-21 DIAGNOSIS — L219 Seborrheic dermatitis, unspecified: Secondary | ICD-10-CM | POA: Diagnosis not present

## 2022-09-21 DIAGNOSIS — D225 Melanocytic nevi of trunk: Secondary | ICD-10-CM | POA: Diagnosis not present

## 2022-09-26 DIAGNOSIS — D485 Neoplasm of uncertain behavior of skin: Secondary | ICD-10-CM | POA: Diagnosis not present

## 2022-10-20 DIAGNOSIS — H2513 Age-related nuclear cataract, bilateral: Secondary | ICD-10-CM | POA: Diagnosis not present

## 2022-10-20 DIAGNOSIS — H524 Presbyopia: Secondary | ICD-10-CM | POA: Diagnosis not present

## 2022-12-22 ENCOUNTER — Encounter: Payer: Self-pay | Admitting: Internal Medicine

## 2022-12-22 NOTE — Progress Notes (Signed)
Comprehensive Evaluation & Examination   Future Appointments  Date Time Provider Department  12/23/2022 10:00 AM Lucky Cowboy, MD [facility]  01/09/2024 10:00 AM Lucky Cowboy, MD [facility]        This very nice 67 y.o.  MWM  with  HTN, HLD,  Prediabetes, Vitamin B12 Deficiency and Vitamin D Deficiency  presents for a  comprehensive evaluation and management of multiple medical co-morbidities.       Patient has been on SS Disability since age 34 yo 2014 consequent of an injury related to a torn Lt Quadriceps on the job for [employer] & last Nov 2022  transitioned to regular Medicare .       HTN predates circa 2010. Patient's BP has been controlled and today's BP was initially elevated & rechecked at goal - 138/88 .   Patient denies any cardiac symptoms as chest pain, palpitations, shortness of breath, dizziness or ankle swelling.       Patient's hyperlipidemia is controlled with diet. Last lipids were at goal except elevated Trig's:  Lab Results  Component Value Date   CHOL 153 08/10/2021   HDL 43 08/10/2021   LDLCALC 79 08/10/2021   TRIG 217 (H) 08/10/2021   CHOLHDL 3.6 08/10/2021        Patient has Moderate Obesity  (BMI 31+) and consequent  prediabetes (A1c_5.8%/2012)  and patient denies reactive hypoglycemic symptoms, visual blurring, diabetic polys or paresthesias. Last A1c was Normal & at goal:  Lab Results  Component Value Date   HGBA1C 5.2 08/10/2021        Finally, patient has history of Vitamin D Deficiency ("24" /2012) and last vitamin D was  low (goal 70-100):  Lab Results  Component Value Date   VD25OH 43 08/10/2021          Patient has hx/o multiple low Vitamin B12  in the past ("214" /2016  and "223" /May 2023) and he has been advised to take Vitamin B12 supplements.     Current Outpatient Medications on File Prior to Visit  Medication Sig   bisoprolol-hctz 10-6.25 MG  Take 1 tablet  Daily    Vitamin D 5,000 Units   Take daily.     lisinopril  40 MG tablet Take 1 tablet Daily   pantoprazole  40 MG tablet Take 1 tablet  daily.   tadalafil  20 MG tablet Take 1/2 -1 tab every 2-3 days as needed for XXXX    No Known Allergies     Past Medical History:  Diagnosis Date   Hyperlipidemia    Hypertension    Vitamin D deficiency      Health Maintenance  Topic Date Due   COVID-19 Vaccine (1) Never done   COLONOSCOPY  09/09/2018   INFLUENZA VACCINE  10/13/2019   PNEUMOCOCCAL POLYSACCHARIDE VACCINE AGE 65-64 HIGH RISK  01/15/2021 (Originally 01/17/1958)   TETANUS/TDAP  03/14/2021   Hepatitis C Screening  Completed   HIV Screening  Completed     Immunization History  Administered Date(s) Administered   Influenza Inj Mdck Quad With Preservative 01/15/2018, 01/31/2019   PPD Test 07/31/2014   Td 11/12/2001   Tdap 03/15/2011    - Last Colon -   09/08/2008 - Dr Arlyce Dice - hyperplastic polyp - 10 yr f/u overdue June 2020  - He was sent a recall letter in May 2023 by Dr.Vito Cirigliano.   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 Does c/o fatigue. Eyes: Denies redness, blurred vision, diplopia, discharge, itchy or watery eyes.  ENT: Denies discharge, congestion, post nasal drip,  epistaxis, sore throat, earache, hearing loss, dental pain, Tinnitus, Vertigo, Sinus pain or snoring.  Cardio: Denies chest pain, palpitations, irregular heartbeat, syncope, dyspnea, diaphoresis, orthopnea, PND, claudication or edema Respiratory: denies cough, dyspnea, DOE, pleurisy, hoarseness, laryngitis or wheezing.  Gastrointestinal: Denies dysphagia, heartburn, reflux, water brash, pain, cramps, nausea, vomiting, bloating, diarrhea, constipation, hematemesis, melena, hematochezia, jaundice or hemorrhoids Genitourinary: Denies dysuria, frequency, urgency, nocturia, hesitancy, discharge, hematuria or flank pain Musculoskeletal: Denies arthralgia, myalgia, stiffness, Jt. Swelling, pain, limp or strain/sprain. Denies Falls. Skin: Denies puritis, rash, hives, warts, acne, eczema or change in skin lesion Neuro: No weakness, tremor, incoordination, spasms, paresthesia or pain Psychiatric: Denies confusion, memory loss or sensory loss. Denies Depression. Endocrine: Denies change in weight, skin, hair change, nocturia, and paresthesia, diabetic polys, visual blurring or hyper / hypo glycemic episodes.  Heme/Lymph: No excessive bleeding, bruising or enlarged lymph nodes.  Physical Exam  BP 138/88   Pulse 72   Temp 97.9 F (36.6 C)   Resp 16   Ht 5\' 9"  (1.753 m)   Wt 221 lb 12.8 oz (100.6 kg)   SpO2 99%   BMI 32.75 kg/m   General Appearance: over nourished and well groomed and in no apparent distress.  Eyes: PERRLA, EOMs, conjunctiva no swelling or erythema, normal fundi and vessels. Sinuses: No frontal/maxillary tenderness ENT/Mouth: EACs patent / TMs  nl. Nares clear without erythema, swelling, mucoid exudates. Oral hygiene is good. No erythema, swelling, or exudate. Tongue normal, non-obstructing. Tonsils not swollen or erythematous. Hearing normal.  Neck: Supple, thyroid not palpable. No bruits, nodes or JVD. Respiratory: Respiratory effort normal.  BS equal and clear bilateral without  rales, rhonci, wheezing or stridor. Cardio: Heart sounds are normal with regular rate and rhythm and no murmurs, rubs or gallops. Peripheral pulses are normal and equal bilaterally without edema. No aortic or femoral bruits. Chest: symmetric with normal excursions and percussion.  Abdomen: Soft, with Nl bowel sounds. Nontender, no guarding, rebound, hernias, masses, or organomegaly.  Lymphatics: Non tender without lymphadenopathy.  Musculoskeletal: Full ROM all peripheral extremities, joint stability, 5/5 strength, and normal gait. Skin: Warm and dry without rashes, lesions, cyanosis, clubbing or  ecchymosis.  Neuro: Cranial nerves intact, reflexes equal bilaterally. Normal muscle tone, no cerebellar symptoms. Sensation intact.  Pysch: Alert and oriented X 3 with normal affect, insight and judgment appropriate.   Assessment and Plan   1. Essential hypertension  - EKG 12-Lead - Urinalysis, Routine w reflex microscopic - Microalbumin / creatinine urine ratio - CBC with Differential/Platelet - COMPLETE METABOLIC  PANEL WITH GFR - Magnesium - TSH   2. Hyperlipidemia, mixed  - EKG 12-Lead - Lipid panel - TSH   3. Abnormal glucose  - EKG 12-Lead - Hemoglobin A1c - Insulin, random   4. Vitamin D deficiency  - Vitamin D level  5. Prediabetes  - EKG 12-Lead - Hemoglobin A1c - Insulin, random  6. B12 deficiency  - Vitamin B12 - CBC with Differential/Platelet   7. Hyperplasia of prostate with lower urinary tract symptoms (LUTS)  - PSA   8. Screening for colorectal cancer  - POC Hemoccult Bld/Stl   9. Prostate cancer screening  - PSA   10. Screening for heart disease  - EKG 12-Lead   11. Former smoker  - EKG 12-Lead   12. Screening for AAA (aortic abdominal aneurysm)   13. Medication management  - Urinalysis, Routine w reflex microscopic - Microalbumin / creatinine urine ratio - CBC with Differential/Platelet - COMPLETE METABOLIC PANEL WITH  GFR - Magnesium - Lipid panel - TSH - Hemoglobin A1c - Insulin, random         Patient was counseled in prudent diet, weight control to achieve/maintain BMI less than 25, BP monitoring, regular exercise and medications as discussed.  Discussed med effects and SE's. Routine screening labs and tests as requested with regular follow-up as recommended. Over 40 minutes of exam, counseling, chart review and high complex critical decision making was performed   Marinus Maw, MD

## 2022-12-22 NOTE — Patient Instructions (Signed)

## 2022-12-23 ENCOUNTER — Encounter: Payer: Self-pay | Admitting: Internal Medicine

## 2022-12-23 ENCOUNTER — Ambulatory Visit (INDEPENDENT_AMBULATORY_CARE_PROVIDER_SITE_OTHER): Payer: Medicare Other | Admitting: Internal Medicine

## 2022-12-23 VITALS — BP 138/88 | HR 72 | Temp 97.9°F | Resp 16 | Ht 69.0 in | Wt 221.8 lb

## 2022-12-23 DIAGNOSIS — Z87891 Personal history of nicotine dependence: Secondary | ICD-10-CM | POA: Diagnosis not present

## 2022-12-23 DIAGNOSIS — Z125 Encounter for screening for malignant neoplasm of prostate: Secondary | ICD-10-CM

## 2022-12-23 DIAGNOSIS — E782 Mixed hyperlipidemia: Secondary | ICD-10-CM

## 2022-12-23 DIAGNOSIS — R7309 Other abnormal glucose: Secondary | ICD-10-CM

## 2022-12-23 DIAGNOSIS — R7303 Prediabetes: Secondary | ICD-10-CM

## 2022-12-23 DIAGNOSIS — E538 Deficiency of other specified B group vitamins: Secondary | ICD-10-CM | POA: Diagnosis not present

## 2022-12-23 DIAGNOSIS — E559 Vitamin D deficiency, unspecified: Secondary | ICD-10-CM | POA: Diagnosis not present

## 2022-12-23 DIAGNOSIS — Z136 Encounter for screening for cardiovascular disorders: Secondary | ICD-10-CM | POA: Diagnosis not present

## 2022-12-23 DIAGNOSIS — N138 Other obstructive and reflux uropathy: Secondary | ICD-10-CM | POA: Diagnosis not present

## 2022-12-23 DIAGNOSIS — Z79899 Other long term (current) drug therapy: Secondary | ICD-10-CM

## 2022-12-23 DIAGNOSIS — N401 Enlarged prostate with lower urinary tract symptoms: Secondary | ICD-10-CM | POA: Diagnosis not present

## 2022-12-23 DIAGNOSIS — I10 Essential (primary) hypertension: Secondary | ICD-10-CM | POA: Diagnosis not present

## 2022-12-23 DIAGNOSIS — Z1211 Encounter for screening for malignant neoplasm of colon: Secondary | ICD-10-CM

## 2022-12-24 ENCOUNTER — Other Ambulatory Visit: Payer: Self-pay | Admitting: Internal Medicine

## 2022-12-24 ENCOUNTER — Encounter: Payer: Self-pay | Admitting: Internal Medicine

## 2022-12-24 NOTE — Progress Notes (Signed)
<>*<>*<>*<>*<>*<>*<>*<>*<>*<>*<>*<>*<>*<>*<>*<>*<>*<>*<>*<>*<>*<>*<>*<>*<> <>*<>*<>*<>*<>*<>*<>*<>*<>*<>*<>*<>*<>*<>*<>*<>*<>*<>*<>*<>*<>*<>*<>*<>*<>  -   Total Chol about same - up from 145 -->> 168 - But still Great   - Bad LDL Chol is worse - Up from 79  -->> 95    - Recommend a stricter  low cholesterol diet   - Cholesterol only comes from animal sources                                                                        - ie. meat, dairy, egg yolks  - Eat all the vegetables you want.  - Avoid Meat, Avoid Meat,  Avoid Meat                                                             - especially Red Meat - Beef AND Pork .  - Avoid cheese & dairy - milk & ice cream.     - Cheese is the most concentrated form of trans-fats which                                                                              is the worst thing to clog up our arteries.    - Veggie cheese is OK which can be found in the fresh                                                produce section at [employer] or [employer] or [employer]  <>*<>*<>*<>*<>*<>*<>*<>*<>*<>*<>*<>*<>*<>*<>*<>*<>*<>*<>*<>*<>*<>*<>*<>*<>  - Also Triglycerides   (worse -  gone from 147 -->> 250 ) / fats in blood are too high                 (   Ideal or  Goal is less than 150  !  )    - Recommend avoid fried & greasy foods,  sweets / candy,   - Avoid white rice  (brown or wild rice or Quinoa is OK),   - Avoid white potatoes  (sweet potatoes are OK)   - Avoid anything made from white flour                             - bagels, doughnuts, rolls, buns, biscuits, white and   wheat breads, pizza crust and traditional pasta made of white flour & egg white                                                  (  vegetarian pasta or spinach or wheat pasta is OK).    - Multi-grain bread is OK - like multi-grain flat bread or sandwich thins.   - Avoid alcohol in excess.   - Exercise is also important.    <>*<>*<>*<>*<>*<>*<>*<>*<>*<>*<>*<>*<>*<>*<>*<>*<>*<>*<>*<>*<>*<>*<>*<>*<>  -  Vit B12 = 330 & is still in Danger Low range                                             Obviously not taking as repeatedly recommend in the past    -  Vitamin B12 =   330 is       STILL        Very    Very Low                                (Ideal or Goal Vit B12 is between 450 - 1,100)   Low Vit B12 may be associated with Anemia , Fatigue,   Peripheral Neuropathy,         Dementia,         "Brain Fog"      & Depression  - Recommend take a sub-lingual form of Vitamin B12 tablet   1,000 to 5,000 mcg tab that you dissolve under your tongue / Daily   - Can get Lavonia Dana - best price at [employer] or on [employer]  <>*<>*<>*<>*<>*<>*<>*<>*<>*<>*<>*<>*<>*<>*<>*<>*<>*<>*<>*<>*<>*<>*<>*<>*<>  -  PSA is low - No Prostate cancer  - Great   <>*<>*<>*<>*<>*<>*<>*<>*<>*<>*<>*<>*<>*<>*<>*<>*<>*<>*<>*<>*<>*<>*<>*<>*<>  -  A1c - Normal -  No Diabetes  - Great !   <>*<>*<>*<>*<>*<>*<>*<>*<>*<>*<>*<>*<>*<>*<>*<>*<>*<>*<>*<>*<>*<>*<>*<>*<>  -  Vitamin D = 33 - Severely & Dangerously LOW                                               Obviously not taken as repeatedly advised in past !   - Vitamin D goal is between 70-100.   - Please Start  taking Vitamin D as 5,000 unit capsules                                                                    x 2 capsules = 10,000 units every day   - It is very important as a natural anti-inflammatory and helping the                           immune system protect against viral infections, like the Covid-19    - Vitamin D helping hair, skin, and nails,   - as well as reducing stroke and heart attack risk. ( By 200 - 300 % )   - It helps your bones and helps with mood.  - It also decreases numerous cancer risks  ( by 200 - 300 % ) so please  take it as directed.     - It is also associated with higher death rate at younger ages,                      autoimmune diseases like Rheumatoid arthritis, Lupus, Multiple Sclerosis.     - Also many other serious conditions, like depression,                                                              Alzheimer's Dementia,                                                                    muscle aches, fatigue, fibromyalgia   <>*<>*<>*<>*<>*<>*<>*<>*<>*<>*<>*<>*<>*<>*<>*<>*<>*<>*<>*<>*<>*<>*<>*<>*<>  -  All Else - CBC - Kidneys - Electrolytes - Liver - Magnesium & Thyroid    - all  Normal / OK  <>*<>*<>*<>*<>*<>*<>*<>*<>*<>*<>*<>*<>*<>*<>*<>*<>*<>*<>*<>*<>*<>*<>*<>*<>

## 2022-12-26 LAB — MICROALBUMIN / CREATININE URINE RATIO
Creatinine, Urine: 77 mg/dL (ref 20–320)
Microalb Creat Ratio: 34 mg/g{creat} — ABNORMAL HIGH (ref <30)
Microalb, Ur: 2.6 mg/dL

## 2022-12-26 LAB — URINALYSIS, ROUTINE W REFLEX MICROSCOPIC
Bilirubin Urine: NEGATIVE
Glucose, UA: NEGATIVE
Hgb urine dipstick: NEGATIVE
Ketones, ur: NEGATIVE
Leukocytes,Ua: NEGATIVE
Nitrite: NEGATIVE
Protein, ur: NEGATIVE
Specific Gravity, Urine: 1.017 (ref 1.001–1.035)
pH: 6 (ref 5.0–8.0)

## 2022-12-26 LAB — VITAMIN D 25 HYDROXY (VIT D DEFICIENCY, FRACTURES): Vit D, 25-Hydroxy: 33 ng/mL (ref 30–100)

## 2022-12-26 LAB — PSA: PSA: 0.66 ng/mL (ref <=4.00)

## 2022-12-26 LAB — COMPLETE METABOLIC PANEL WITH GFR
AG Ratio: 1.8 (calc) (ref 1.0–2.5)
ALT: 21 U/L (ref 9–46)
AST: 17 U/L (ref 10–35)
Albumin: 4.7 g/dL (ref 3.6–5.1)
Alkaline phosphatase (APISO): 69 U/L (ref 35–144)
BUN: 18 mg/dL (ref 7–25)
CO2: 28 mmol/L (ref 20–32)
Calcium: 9.7 mg/dL (ref 8.6–10.3)
Chloride: 103 mmol/L (ref 98–110)
Creat: 1.05 mg/dL (ref 0.70–1.35)
Globulin: 2.6 g/dL (ref 1.9–3.7)
Glucose, Bld: 90 mg/dL (ref 65–99)
Potassium: 5 mmol/L (ref 3.5–5.3)
Sodium: 138 mmol/L (ref 135–146)
Total Bilirubin: 0.7 mg/dL (ref 0.2–1.2)
Total Protein: 7.3 g/dL (ref 6.1–8.1)
eGFR: 78 mL/min/{1.73_m2} (ref >=60)

## 2022-12-26 LAB — CBC WITH DIFFERENTIAL/PLATELET
Absolute Monocytes: 593 {cells}/uL (ref 200–950)
Basophils Absolute: 41 {cells}/uL (ref 0–200)
Basophils Relative: 0.6 %
Eosinophils Absolute: 48 {cells}/uL (ref 15–500)
Eosinophils Relative: 0.7 %
HCT: 49.6 % (ref 38.5–50.0)
Hemoglobin: 16.5 g/dL (ref 13.2–17.1)
Lymphs Abs: 1918 {cells}/uL (ref 850–3900)
MCH: 29.6 pg (ref 27.0–33.0)
MCHC: 33.3 g/dL (ref 32.0–36.0)
MCV: 88.9 fL (ref 80.0–100.0)
MPV: 9.5 fL (ref 7.5–12.5)
Monocytes Relative: 8.6 %
Neutro Abs: 4299 {cells}/uL (ref 1500–7800)
Neutrophils Relative %: 62.3 %
Platelets: 151 10*3/uL (ref 140–400)
RBC: 5.58 10*6/uL (ref 4.20–5.80)
RDW: 13.2 % (ref 11.0–15.0)
Total Lymphocyte: 27.8 %
WBC: 6.9 10*3/uL (ref 3.8–10.8)

## 2022-12-26 LAB — LIPID PANEL
Cholesterol: 168 mg/dL (ref <200)
HDL: 37 mg/dL — ABNORMAL LOW (ref >=40)
LDL Cholesterol (Calc): 95 mg/dL
Non-HDL Cholesterol (Calc): 131 mg/dL — ABNORMAL HIGH (ref <130)
Total CHOL/HDL Ratio: 4.5 (calc) (ref <5.0)
Triglycerides: 250 mg/dL — ABNORMAL HIGH (ref <150)

## 2022-12-26 LAB — VITAMIN B12: Vitamin B-12: 330 pg/mL (ref 200–1100)

## 2022-12-26 LAB — INSULIN, RANDOM: Insulin: 13.9 u[IU]/mL

## 2022-12-26 LAB — HEMOGLOBIN A1C
Hgb A1c MFr Bld: 5.2 %{Hb} (ref <5.7)
Mean Plasma Glucose: 103 mg/dL
eAG (mmol/L): 5.7 mmol/L

## 2022-12-26 LAB — TSH: TSH: 2.12 m[IU]/L (ref 0.40–4.50)

## 2022-12-26 LAB — MAGNESIUM: Magnesium: 2.3 mg/dL (ref 1.5–2.5)

## 2023-04-25 ENCOUNTER — Encounter: Payer: Self-pay | Admitting: *Deleted

## 2023-07-18 IMAGING — CR DG RIBS W/ CHEST 3+V*R*
5 series · 5 of 5 positions shown · non-contrast
Comparison: 05/16/2019

CLINICAL DATA: Intermittent right-sided chest pain.

EXAM:
RIGHT RIBS AND CHEST - 3+ VIEW

[w chest pa]
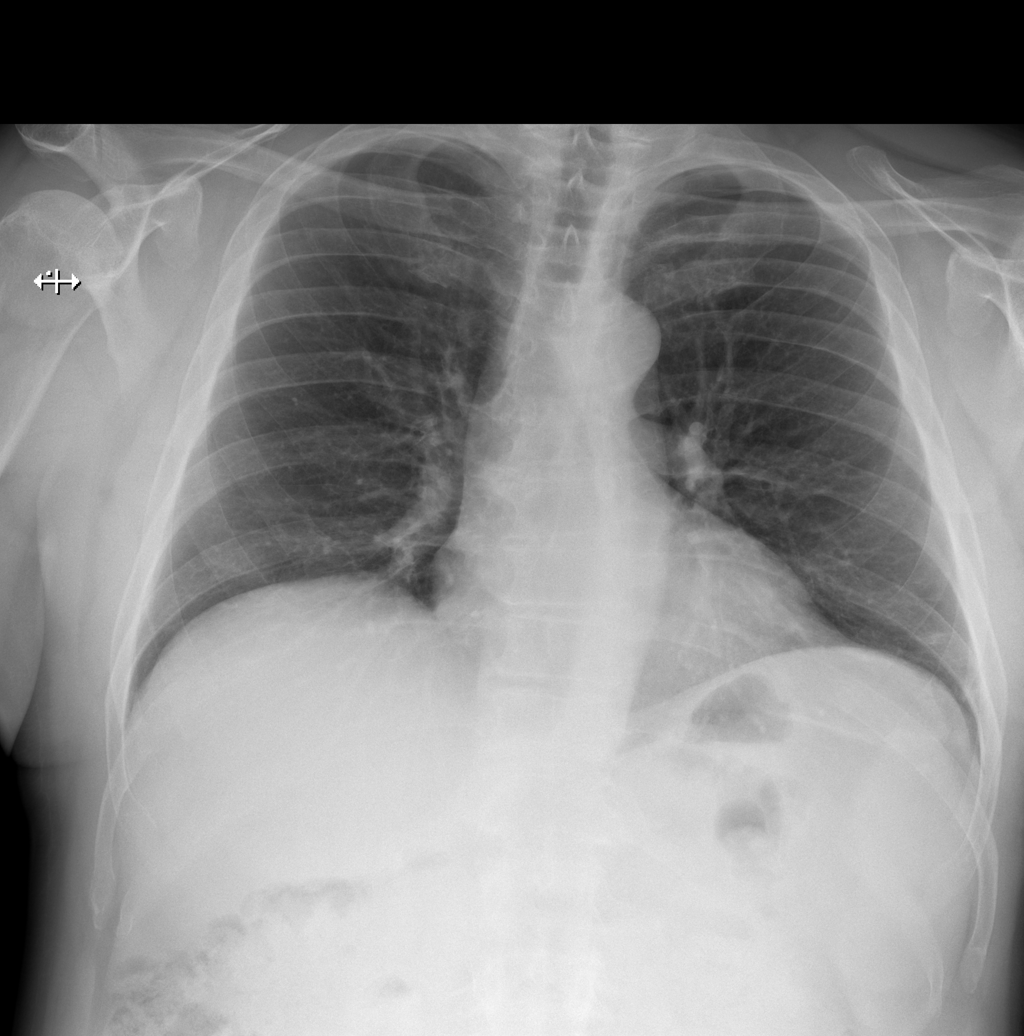

[w ribs ap upper right]
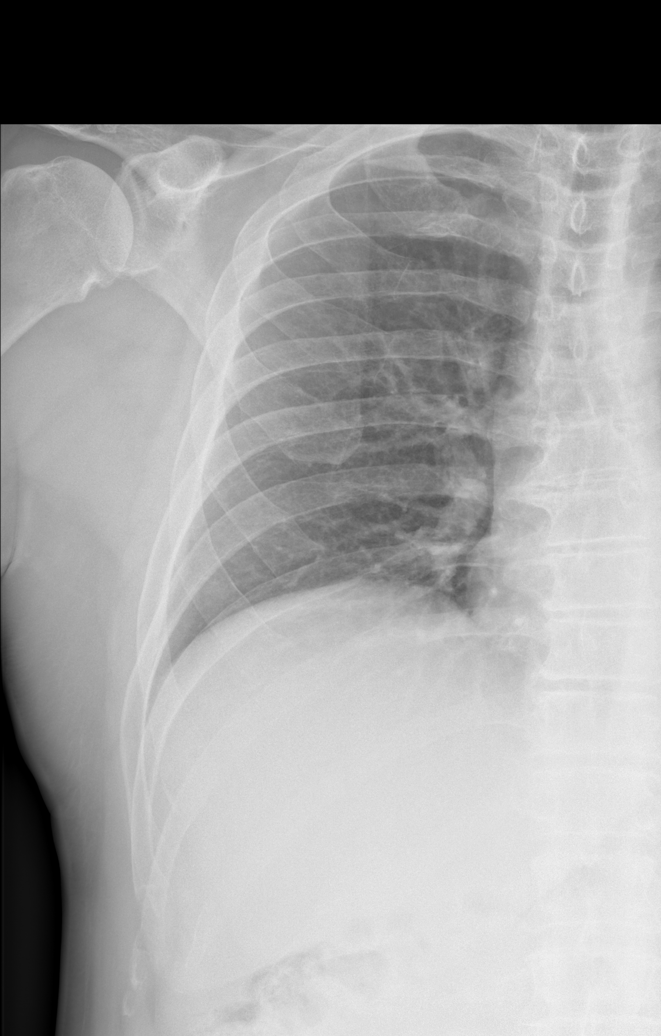

[w ribs obl right (1 of 3)]
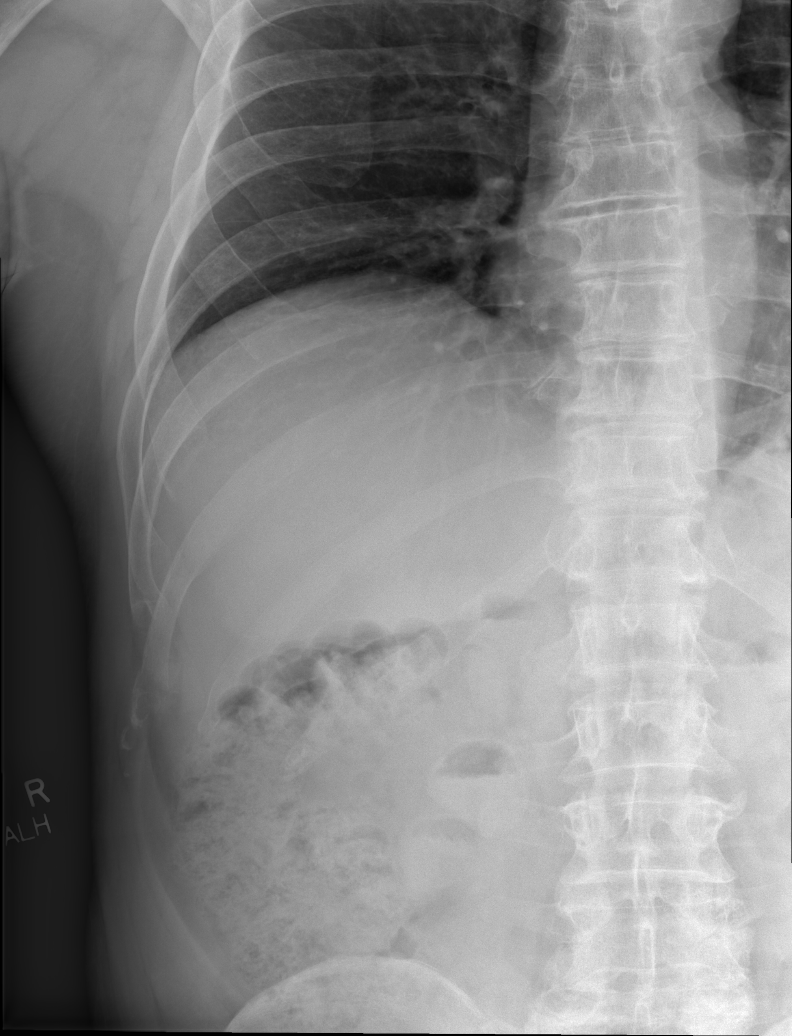

[w ribs obl right (2 of 3)]
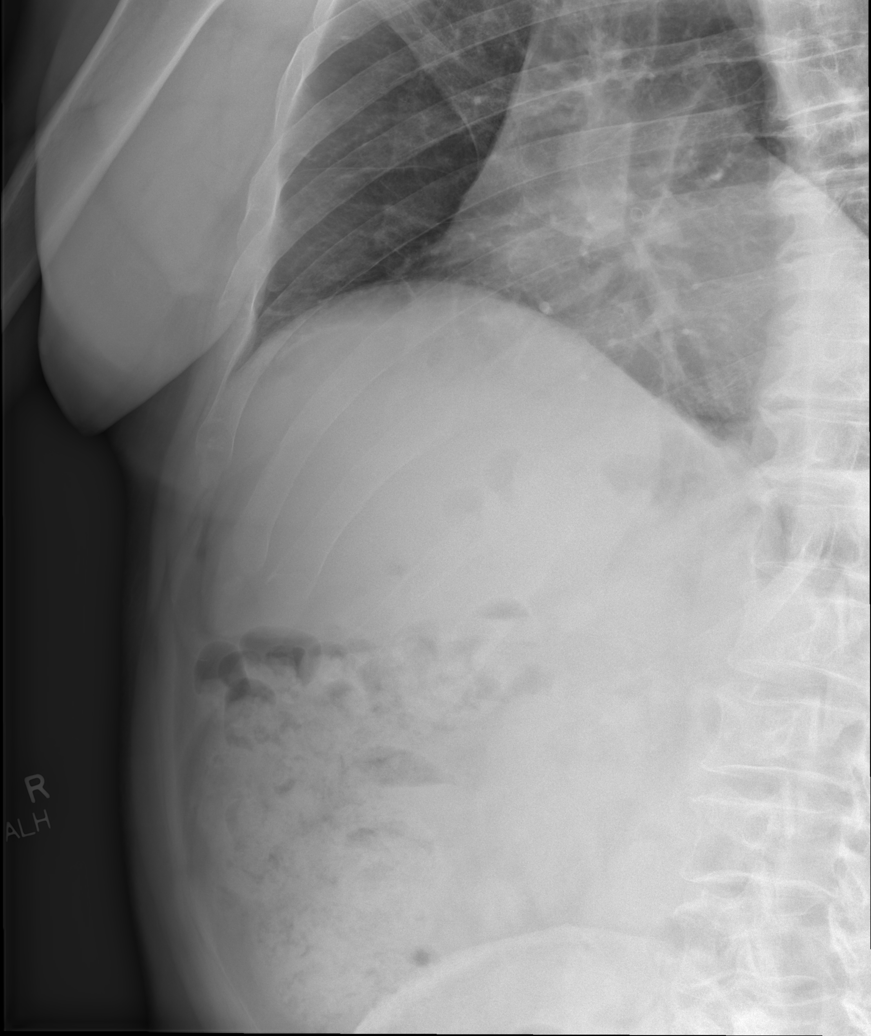

[w ribs obl right (3 of 3)]
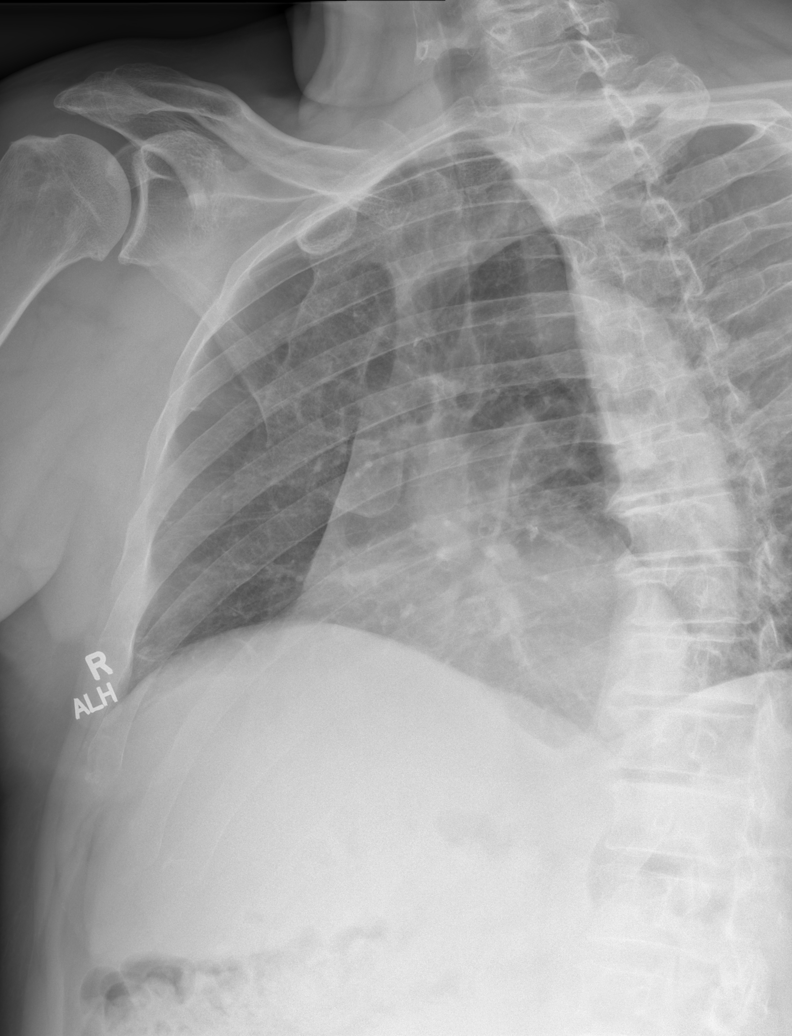

[5 of 5 positions shown; findings below may reference images not displayed]

FINDINGS: Lungs are clear. Negative for a pneumothorax. Heart size is normal.
Right ribs are intact without a displaced fracture. Normal alignment
the right AC joint. Degenerative changes in the lower thoracic
spine.
IMPRESSION: No acute abnormality.

## 2023-08-16 ENCOUNTER — Ambulatory Visit: Payer: Medicare Other | Admitting: Nurse Practitioner

## 2023-09-17 DIAGNOSIS — E785 Hyperlipidemia, unspecified: Secondary | ICD-10-CM | POA: Diagnosis not present

## 2023-09-17 DIAGNOSIS — M25521 Pain in right elbow: Secondary | ICD-10-CM | POA: Diagnosis not present

## 2023-09-17 DIAGNOSIS — S2231XA Fracture of one rib, right side, initial encounter for closed fracture: Secondary | ICD-10-CM | POA: Diagnosis not present

## 2023-09-17 DIAGNOSIS — S270XXA Traumatic pneumothorax, initial encounter: Secondary | ICD-10-CM | POA: Diagnosis not present

## 2023-09-17 DIAGNOSIS — I119 Hypertensive heart disease without heart failure: Secondary | ICD-10-CM | POA: Diagnosis not present

## 2023-09-17 DIAGNOSIS — J90 Pleural effusion, not elsewhere classified: Secondary | ICD-10-CM | POA: Diagnosis not present

## 2023-09-17 DIAGNOSIS — Z79899 Other long term (current) drug therapy: Secondary | ICD-10-CM | POA: Diagnosis not present

## 2023-09-17 DIAGNOSIS — I2510 Atherosclerotic heart disease of native coronary artery without angina pectoris: Secondary | ICD-10-CM | POA: Diagnosis not present

## 2023-09-17 DIAGNOSIS — M7918 Myalgia, other site: Secondary | ICD-10-CM | POA: Diagnosis not present

## 2023-09-17 DIAGNOSIS — J939 Pneumothorax, unspecified: Secondary | ICD-10-CM | POA: Diagnosis not present

## 2023-09-17 DIAGNOSIS — R0602 Shortness of breath: Secondary | ICD-10-CM | POA: Diagnosis not present

## 2023-09-17 DIAGNOSIS — W010XXA Fall on same level from slipping, tripping and stumbling without subsequent striking against object, initial encounter: Secondary | ICD-10-CM | POA: Diagnosis not present

## 2023-09-17 DIAGNOSIS — J9383 Other pneumothorax: Secondary | ICD-10-CM | POA: Diagnosis not present

## 2023-09-17 DIAGNOSIS — I10 Essential (primary) hypertension: Secondary | ICD-10-CM | POA: Diagnosis not present

## 2023-09-17 DIAGNOSIS — J9811 Atelectasis: Secondary | ICD-10-CM | POA: Diagnosis not present

## 2023-09-17 DIAGNOSIS — Z6831 Body mass index (BMI) 31.0-31.9, adult: Secondary | ICD-10-CM | POA: Diagnosis not present

## 2023-09-17 DIAGNOSIS — E669 Obesity, unspecified: Secondary | ICD-10-CM | POA: Diagnosis not present

## 2023-09-17 DIAGNOSIS — Z87891 Personal history of nicotine dependence: Secondary | ICD-10-CM | POA: Diagnosis not present

## 2023-09-17 DIAGNOSIS — S42401A Unspecified fracture of lower end of right humerus, initial encounter for closed fracture: Secondary | ICD-10-CM | POA: Diagnosis not present

## 2023-09-25 DIAGNOSIS — Z1330 Encounter for screening examination for mental health and behavioral disorders, unspecified: Secondary | ICD-10-CM | POA: Diagnosis not present

## 2023-09-25 DIAGNOSIS — S52021A Displaced fracture of olecranon process without intraarticular extension of right ulna, initial encounter for closed fracture: Secondary | ICD-10-CM | POA: Diagnosis not present

## 2023-09-25 DIAGNOSIS — M25521 Pain in right elbow: Secondary | ICD-10-CM | POA: Diagnosis not present

## 2023-10-09 ENCOUNTER — Ambulatory Visit (INDEPENDENT_AMBULATORY_CARE_PROVIDER_SITE_OTHER): Admitting: Family Medicine

## 2023-10-09 ENCOUNTER — Ambulatory Visit: Attending: Family Medicine

## 2023-10-09 ENCOUNTER — Ambulatory Visit (HOSPITAL_BASED_OUTPATIENT_CLINIC_OR_DEPARTMENT_OTHER)
Admission: RE | Admit: 2023-10-09 | Discharge: 2023-10-09 | Disposition: A | Discharge status: Home / Self Care | Source: Ambulatory Visit | Attending: Family Medicine | Admitting: Family Medicine

## 2023-10-09 ENCOUNTER — Encounter: Payer: Self-pay | Admitting: Family Medicine

## 2023-10-09 ENCOUNTER — Ambulatory Visit: Payer: Self-pay | Admitting: Family Medicine

## 2023-10-09 VITALS — BP 133/90 | HR 105 | Ht 69.0 in | Wt 218.0 lb

## 2023-10-09 DIAGNOSIS — R002 Palpitations: Secondary | ICD-10-CM

## 2023-10-09 DIAGNOSIS — Z8709 Personal history of other diseases of the respiratory system: Secondary | ICD-10-CM | POA: Diagnosis not present

## 2023-10-09 DIAGNOSIS — R000 Tachycardia, unspecified: Secondary | ICD-10-CM

## 2023-10-09 DIAGNOSIS — E559 Vitamin D deficiency, unspecified: Secondary | ICD-10-CM | POA: Diagnosis not present

## 2023-10-09 DIAGNOSIS — I10 Essential (primary) hypertension: Secondary | ICD-10-CM

## 2023-10-09 DIAGNOSIS — E785 Hyperlipidemia, unspecified: Secondary | ICD-10-CM | POA: Diagnosis not present

## 2023-10-09 DIAGNOSIS — N529 Male erectile dysfunction, unspecified: Secondary | ICD-10-CM

## 2023-10-09 DIAGNOSIS — I2510 Atherosclerotic heart disease of native coronary artery without angina pectoris: Secondary | ICD-10-CM | POA: Insufficient documentation

## 2023-10-09 DIAGNOSIS — J939 Pneumothorax, unspecified: Secondary | ICD-10-CM | POA: Diagnosis not present

## 2023-10-09 HISTORY — DX: Atherosclerotic heart disease of native coronary artery without angina pectoris: I25.10

## 2023-10-09 LAB — EKG 12-LEAD

## 2023-10-09 MED ORDER — BISOPROLOL-HYDROCHLOROTHIAZIDE 10-6.25 MG PO TABS: ORAL_TABLET | ORAL | 3 refills | Status: DC

## 2023-10-09 MED ORDER — SILDENAFIL CITRATE 50 MG PO TABS
50.0000 mg | ORAL_TABLET | ORAL | 1 refills | Status: DC | PRN
Start: 2023-10-09 — End: 2023-11-24

## 2023-10-09 MED ORDER — LISINOPRIL 40 MG PO TABS: ORAL_TABLET | ORAL | 3 refills | Status: DC

## 2023-10-09 NOTE — Patient Instructions (Addendum)
 For fast heart rate with occasional palpitations - stable EKG today, just mildly fast. Will get chest xray to ensure no findings related to recent incident that could be causing symptoms. Labs today. Ordering Zio (2 week heart monitor). Referral to cardiology to further evaluate, especially since your last CT scan showed coronary artery calcifications. Recommend starting a cholesterol medicine.  Recommended reducing caffeine intake.   IF YOU DEVELOP ANY NEW OR WORSENING SYMPTOMS, GO TO THE HOSPITAL      Thank you for choosing [facility] at [facility] for your Primary Care needs. I am excited for the opportunity to partner with you to meet your health care goals. It was a pleasure meeting you today!  Information on diet, exercise, and health maintenance recommendations are listed below. This is information to help you be sure you are on track for optimal health and monitoring.   Please look over this and let us  know if you have any questions or if you have completed any of the health maintenance outside of [facility] so that we can be sure your records are up to date.  ___________________________________________________________  MyChart:  For all urgent or time sensitive needs we ask that you please call the office to avoid delays. Our number is [phone]. MyChart is not constantly monitored and due to the large volume of messages a day, replies may take up to 72 business hours.  MyChart Policy: MyChart allows for you to see your visit notes, after visit summary, provider recommendations, lab and tests results, make an appointment, request refills, and contact your provider or the office for non-urgent questions or concerns. Providers are seeing patients during normal business hours and do not have built in time to review MyChart messages.  We ask that you allow a minimum of 3 business days for responses to [employer]. For this reason, please do not send urgent  requests through MyChart. Please call the office at [phone]. New and ongoing conditions may require a visit. We have virtual and in-person visits available for your convenience.  Complex MyChart concerns may require a visit. Your provider may request you schedule a virtual or in-person visit to ensure we are providing the best care possible. MyChart messages sent after 11:00 AM on Friday may not be received by the provider until Monday morning.    Lab and Test Results: You will receive your lab and test results on MyChart as soon as they are completed and results have been sent by the lab or testing facility. Due to this service, you will receive your results BEFORE your provider.  I review lab and test results each morning prior to seeing patients. Some results require collaboration with other providers to ensure you are receiving the most appropriate care. For this reason, we ask that you please allow a minimum of 3-5 business days from the time that ALL results have been received for your provider to receive and review lab and test results and contact you about these.  Most lab and test result comments from the provider will be sent through MyChart. Your provider may recommend changes to the plan of care, follow-up visits, repeat testing, ask questions, or request an office visit to discuss these results. You may reply directly to this message or call the office to provide information for the provider or set up an appointment. In some instances, you will be called with test results and recommendations. Please let us  know if this is preferred and we will  make note of this in your chart to provide this for you.    If you have not heard a response to your lab or test results in 5 business days from all results returning to MyChart, please call the office to let us  know. We ask that you please avoid calling prior to this time unless there is an emergent concern. Due to high call volumes, this can delay  the resulting process.  After Hours: For all non-emergency after hours needs, please call the office at [phone] and select the option to reach the on-call  service. On-call services are shared between multiple [place] offices and therefore it will not be possible to speak directly with your provider. On-call providers may provide medical advice and recommendations, but are unable to provide refills for maintenance medications.  For all emergency or urgent medical needs after normal business hours, we recommend that you seek care at the closest Urgent Care or Emergency Department to ensure appropriate treatment in a timely manner.  [facility] has a 24 hour emergency room located on the ground floor for your convenience.   Urgent Concerns During the Business Day Providers are seeing patients from 8AM to 5PM with a busy schedule and are most often not able to respond to non-urgent calls until the end of the day or the next business day. If you should have URGENT concerns during the day, please call and speak to the nurse or schedule a same day appointment so that we can address your concern without delay.   Thank you, again, for choosing me as your health care partner. I appreciate your trust and look forward to learning more about you!   Anthony FURY Almarie, DNP, FNP-C  ___________________________________________________________  Health Maintenance Recommendations Screening Testing Mammogram Every 1-2 years based on history and risk factors Starting at age 30 Pap Smear Ages 21-39 every 3 years Ages 19-65 every 5 years with HPV testing More frequent testing may be required based on results and history Colon Cancer Screening Every 1-10 years based on test performed, risk factors, and history Starting at age 34 Bone Density Screening Every 2-10 years based on history Starting at age 38 for women Recommendations for men differ based on medication usage, history, and risk  factors AAA Screening One time ultrasound Men 21-62 years old who have ever smoked Lung Cancer Screening Low Dose Lung CT every 12 months Age 55-80 years with a 20 pack-year smoking history who still smoke or who have quit within the last 15 years  Screening Labs Routine  Labs: Complete Blood Count (CBC), Complete Metabolic Panel (CMP), Cholesterol (Lipid Panel) Every 6-12 months based on history and medications May be recommended more frequently based on current conditions or previous results Hemoglobin A1c Lab Every 3-12 months based on history and previous results Starting at age 27 or earlier with diagnosis of diabetes, high cholesterol, BMI >26, and/or risk factors Frequent monitoring for patients with diabetes to ensure blood sugar control Thyroid  Panel  Every 6 months based on history, symptoms, and risk factors May be repeated more often if on medication HIV One time testing for all patients 70 and older May be repeated more frequently for patients with increased risk factors or exposure Hepatitis C One time testing for all patients 13 and older May be repeated more frequently for patients with increased risk factors or exposure Gonorrhea, Chlamydia Every 12 months for all sexually active persons 13-24 years Additional monitoring may be recommended for those who are  considered high risk or who have symptoms PSA Men 68-35 years old with risk factors Additional screening may be recommended from age 33-69 based on risk factors, symptoms, and history  Vaccine Recommendations Tetanus Booster All adults every 10 years Flu Vaccine All patients 6 months and older every year COVID Vaccine All patients 12 years and older Initial dosing with booster May recommend additional booster based on age and health history HPV Vaccine 2 doses all patients age 7-26 Dosing may be considered for patients over 26 Shingles Vaccine (Shingrix) 2 doses all adults 50 years and older Pneumonia  (Pneumovax 43) All adults 65 years and older May recommend earlier dosing based on health history Pneumonia (Prevnar 91) All adults 65 years and older Dosed 1 year after Pneumovax 23 Pneumonia (Prevnar 20) All adults 65 years and older (adults 19-64 with certain conditions or risk factors) 1 dose  For those who have not received Prevnar 13 vaccine previously   Additional Screening, Testing, and Vaccinations may be recommended on an individualized basis based on family history, health history, risk factors, and/or exposure.  __________________________________________________________  Diet Recommendations for All Patients  I recommend that all patients maintain a diet low in saturated fats, carbohydrates, and cholesterol. While this can be challenging at first, it is not impossible and small changes can make big differences.  Things to try: Decreasing the amount of soda, sweet tea, and/or juice to one or less per day and replace with water While water is always the first choice, if you do not like water you may consider adding a water additive without sugar to improve the taste other sugar free drinks Replace potatoes with a brightly colored vegetable  Use healthy oils, such as canola oil or olive oil, instead of butter or hard margarine Limit your bread intake to two pieces or less a day Replace regular pasta with low carb pasta options Bake, broil, or grill foods instead of frying Monitor portion sizes  Eat smaller, more frequent meals throughout the day instead of large meals  An important thing to remember is, if you love foods that are not great for your health, you don't have to give them up completely. Instead, allow these foods to be a reward when you have done well. Allowing yourself to still have special treats every once in a while is a nice way to tell yourself thank you for working hard to keep yourself healthy.   Also remember that every day is a new day. If you have a  bad day and fall off the wagon, you can still climb right back up and keep moving along on your journey!  We have resources available to help you!  Some websites that may be helpful include: www.http://www.wall-moore.info/  Www.VeryWellFit.com _____________________________________________________________  Activity Recommendations for All Patients  I recommend that all adults get at least 30 minutes of moderate physical activity that elevates your heart rate at least 5 days out of the week.  Some examples include: Walking or jogging at a pace that allows you to carry on a conversation Cycling (stationary bike or outdoors) Water aerobics Yoga Weight lifting Dancing If physical limitations prevent you from putting stress on your joints, exercise in a pool or seated in a chair are excellent options.  Do determine your MAXIMUM heart rate for activity: 220 - YOUR AGE = MAX Heart Rate   Remember! Do not push yourself too hard.  Start slowly and build up your pace, speed, weight, time in exercise, etc.  Allow  your body to rest between exercise and get good sleep. You will need more water than normal when you are exerting yourself. Do not wait until you are thirsty to drink. Drink with a purpose of getting in at least 8, 8 ounce glasses of water a day plus more depending on how much you exercise and sweat.    If you begin to develop dizziness, chest pain, abdominal pain, jaw pain, shortness of breath, headache, vision changes, lightheadedness, or other concerning symptoms, stop the activity and allow your body to rest. If your symptoms are severe, seek emergency evaluation immediately. If your symptoms are concerning, but not severe, please let us  know so that we can recommend further evaluation.

## 2023-10-09 NOTE — Assessment & Plan Note (Signed)
 Noted on chest CT July 2025

## 2023-10-09 NOTE — Progress Notes (Unsigned)
 EP to read.

## 2023-10-09 NOTE — Progress Notes (Signed)
 New Patient Office Visit  Subjective    Patient ID: Anthony Boone, male    DOB: 03/10/1956  Age: 68 y.o. MRN: [id number]  CC:  Chief Complaint  Patient presents with   Establish Care    HPI Anthony Boone presents to establish care   Discussed the use of AI scribe software for clinical note transcription with the patient, who gave verbal consent to proceed.  History of Present Illness Anthony Boone is a 68 year old male presents to establish care.  He experienced a fall on July 5th while playing with his grandsons, resulting a broken rib on the right side, a broken elbow, and a pneumothorax. He has not seen his orthopedic doctor since the incident. There is now a pocket of fluid at the elbow.   He was hospitalized for three days and placed on oxygen, which resolved the pneumothorax. He used an incentive spirometer to aid lung recovery. CT scan incidentally found coronary artery calcification.   He has a history of hypertension and hyperlipidemia. He was previously non-compliant with his blood pressure medication but has been taking it regularly since his hospital stay. Current medications include bisoprolol /hydrochlorothiazide , lisinopril , and a daily baby aspirin . He also takes vitamin D , zinc , and sildenafil  as needed.  He reports occasional palpitations described as 'heart racing' lasting 15-20 seconds, occurring randomly without specific triggers. He consumes 40 ounces of caffeinated coffee daily, which may contribute to palpitations.  He has a history of high cholesterol, which was recently stable with a total cholesterol of 168 and LDL of 95. Triglycerides were previously high, and HDL was lower than desired.  He has a history of fatty liver disease, which he manages by avoiding alcohol, consuming only occasional wine. His father died of cirrhosis, prompting him to avoid alcohol.  He retired from [employer] in 2015 due to knee issues and is active in his church community, involved  in various activities, and has a supportive family life. Reports no stress.   Patient denies any chest pain, dyspnea, wheezing, edema, recurrent headaches, vision changes. He reports feeling well overall.     Outpatient Encounter Medications as of 10/09/2023  Medication Sig   aspirin  EC 81 MG tablet Takes 1 tablet Daily   CHOLECALCIFEROL PO Take 5,000 Units by mouth daily.    Zinc  50 MG TABS Take 1 tablet Daily   [DISCONTINUED] bisoprolol -hydrochlorothiazide  (ZIAC ) 10-6.25 MG tablet Take  1 tablet  Daily   for BP   [DISCONTINUED] lisinopril  (ZESTRIL ) 40 MG tablet Take  1 tablet   Daily   for BP   [DISCONTINUED] sildenafil  (VIAGRA ) 50 MG tablet Take 1 tablet (50 mg total) by mouth as needed for erectile dysfunction. Take 1 hour before sexual intercourse.   bisoprolol -hydrochlorothiazide  (ZIAC ) 10-6.25 MG tablet Take  1 tablet  Daily   for BP   lisinopril  (ZESTRIL ) 40 MG tablet Take  1 tablet   Daily   for BP   sildenafil  (VIAGRA ) 50 MG tablet Take 1 tablet (50 mg total) by mouth as needed for erectile dysfunction. Take 1 hour before sexual intercourse.   No facility-administered encounter medications on file as of 10/09/2023.    Past Medical History:  Diagnosis Date   Hyperlipidemia    Hypertension    Vitamin D  deficiency     Past Surgical History:  Procedure Laterality Date   APPENDECTOMY  1974   VASECTOMY  2005   Dr Donelle    Family History  Problem Relation Age of Onset  Heart disease Father    Hypertension Brother     Social History   Socioeconomic History   Marital status: Married    Spouse name: Not on file   Number of children: Not on file   Years of education: Not on file   Highest education level: Not on file  Occupational History   Not on file  Tobacco Use   Smoking status: Former    Current packs/day: 0.00    Average packs/day: 1.5 packs/day for 29.0 years (43.5 ttl pk-yrs)    Types: Cigarettes    Start date: 20    Quit date: 33    Years since  quitting: 31.5   Smokeless tobacco: Never  Substance and Sexual Activity   Alcohol use: Yes   Drug use: No   Sexual activity: Not on file  Other Topics Concern   Not on file  Social History Narrative   Not on file   Social Drivers of Health   Financial Resource Strain: Low Risk  (09/24/2023)   Received from [facility]   Overall Financial Resource Strain (CARDIA)    How hard is it for you to pay for the very basics like food, housing, medical care, and heating?: Not hard at all  Food Insecurity: No Food Insecurity (09/24/2023)   Received from [facility]   Hunger Vital Sign    Within the past 12 months, you worried that your food would run out before you got the money to buy more.: Never true    Within the past 12 months, the food you bought just didn't last and you didn't have money to get more.: Never true  Transportation Needs: No Transportation Needs (09/24/2023)   Received from [facility] - Transportation    In the past 12 months, has lack of transportation kept you from medical appointments or from getting medications?: No    In the past 12 months, has lack of transportation kept you from meetings, work, or from getting things needed for daily living?: No  Physical Activity: Insufficiently Active (09/24/2023)   Received from [facility]   Exercise Vital Sign    On average, how many days per week do you engage in moderate to strenuous exercise (like a brisk walk)?: 2 days    On average, how many minutes do you engage in exercise at this level?: 30 min  Stress: No Stress Concern Present (09/24/2023)   Received from [facility] - Occupational Stress Questionnaire    Do you feel stress - tense, restless, nervous, or anxious, or unable to sleep at night because your mind is troubled all the time - these days?: Not at all  Recent Concern: Stress - Stress Concern Present (09/17/2023)   Received from [facility] - Occupational Stress Questionnaire    Feeling of Stress : To some extent  Social Connections: Socially Integrated (09/24/2023)   Received from [facility]   Social Network    How would you rate your social network (family, work, friends)?: Good participation with social networks  Intimate Partner Violence: Not At Risk (09/24/2023)   Received from [facility]   HITS    Over the last 12 months how often did your partner physically hurt you?: Never    Over the last 12 months how often did your partner insult you or talk down to you?: Never    Over the last 12 months how  often did your partner threaten you with physical harm?: Never    Over the last 12 months how often did your partner scream or curse at you?: Never    ROS All review of systems negative except what is listed in the HPI    Objective    BP (!) 133/90   Pulse (!) 105   Ht 5' 9 (1.753 m)   Wt 218 lb (98.9 kg)   SpO2 97%   BMI 32.19 kg/m   Physical Exam Vitals reviewed.  Constitutional:      General: He is not in acute distress.    Appearance: Normal appearance. He is not ill-appearing.  Cardiovascular:     Rate and Rhythm: Regular rhythm. Tachycardia present.     Heart sounds: Normal heart sounds.  Pulmonary:     Effort: Pulmonary effort is normal. No respiratory distress.     Breath sounds: No wheezing, rhonchi or rales.     Comments: Mildly diminished bases Musculoskeletal:     Right lower leg: No edema.     Left lower leg: No edema.     Comments: R elbow bursitis   Skin:    General: Skin is warm and dry.  Neurological:     Mental Status: He is alert and oriented to person, place, and time.  Psychiatric:        Mood and Affect: Mood normal.        Behavior: Behavior normal.        Thought Content: Thought content normal.        Judgment: Judgment normal.           Assessment & Plan:   Problem List Items Addressed This Visit       Active Problems    Hyperlipidemia   Relevant Medications   sildenafil  (VIAGRA ) 50 MG tablet   lisinopril  (ZESTRIL ) 40 MG tablet   bisoprolol -hydrochlorothiazide  (ZIAC ) 10-6.25 MG tablet   Other Relevant Orders   Comprehensive metabolic panel with GFR   Lipid panel   Hypertension   Relevant Medications   sildenafil  (VIAGRA ) 50 MG tablet   lisinopril  (ZESTRIL ) 40 MG tablet   bisoprolol -hydrochlorothiazide  (ZIAC ) 10-6.25 MG tablet   Other Relevant Orders   Comprehensive metabolic panel with GFR   Vitamin D  deficiency   Relevant Orders   VITAMIN D  25 Hydroxy (Vit-D Deficiency, Fractures)   Erectile dysfunction   Relevant Medications   sildenafil  (VIAGRA ) 50 MG tablet   Coronary artery calcification seen on CAT scan   Noted on chest CT July 2025      Relevant Medications   sildenafil  (VIAGRA ) 50 MG tablet   lisinopril  (ZESTRIL ) 40 MG tablet   bisoprolol -hydrochlorothiazide  (ZIAC ) 10-6.25 MG tablet   Other Relevant Orders   Ambulatory referral to Cardiology   Other Visit Diagnoses       Tachycardia    -  Primary   Relevant Orders   EKG 12-Lead (Completed)   CBC with Differential/Platelet   TSH   LONG TERM MONITOR (3-14 DAYS)   Ambulatory referral to Cardiology   B12 and Folate Panel   DG Chest 2 View     Palpitations       Relevant Orders   TSH   LONG TERM MONITOR (3-14 DAYS)   Ambulatory referral to Cardiology   B12 and Folate Panel   DG Chest 2 View     History of pneumothorax       Relevant Orders   DG Chest 2 View  For fast heart rate with occasional palpitations - stable EKG today (ST 105 bpm). Will get chest xray to ensure no findings related to recent incident that could be causing symptoms. Labs today. Ordering Zio (2 week heart monitor). Referral to cardiology to further evaluate, especially since your last CT scan showed coronary artery calcifications. Recommend starting a cholesterol medicine - report he wants to wait and see what labs show today.  Recommended reducing  caffeine intake.  Monitor BP and HR at home.  Asymptomatic currently. Follow-up with Ortho regarding right elbow bursitis.   IF YOU DEVELOP ANY NEW OR WORSENING SYMPTOMS, GO TO THE HOSPITAL    Return in about 2 weeks (around 10/23/2023) for HTN follow-up (unless getting in with cardiology soon, then 6 month follow-up).   Anthony Boone Mon, NP

## 2023-10-10 LAB — COMPREHENSIVE METABOLIC PANEL WITH GFR
ALT: 20 U/L (ref 0–53)
AST: 18 U/L (ref 0–37)
Albumin: 4.8 g/dL (ref 3.5–5.2)
Alkaline Phosphatase: 86 U/L (ref 39–117)
BUN: 16 mg/dL (ref 6–23)
CO2: 23 meq/L (ref 19–32)
Calcium: 9.2 mg/dL (ref 8.4–10.5)
Chloride: 104 meq/L (ref 96–112)
Creatinine, Ser: 0.98 mg/dL (ref 0.40–1.50)
GFR: 79.67 mL/min (ref >60.00)
Glucose, Bld: 85 mg/dL (ref 70–99)
Potassium: 4 meq/L (ref 3.5–5.1)
Sodium: 138 meq/L (ref 135–145)
Total Bilirubin: 0.7 mg/dL (ref 0.2–1.2)
Total Protein: 7.4 g/dL (ref 6.0–8.3)

## 2023-10-10 LAB — LIPID PANEL
Cholesterol: 157 mg/dL (ref 0–200)
HDL: 36.2 mg/dL — ABNORMAL LOW (ref >39.00)
LDL Cholesterol: 55 mg/dL (ref 0–99)
NonHDL: 120.66
Total CHOL/HDL Ratio: 4
Triglycerides: 326 mg/dL — ABNORMAL HIGH (ref 0.0–149.0)
VLDL: 65.2 mg/dL — ABNORMAL HIGH (ref 0.0–40.0)

## 2023-10-10 LAB — CBC WITH DIFFERENTIAL/PLATELET
Basophils Absolute: 0 K/uL (ref 0.0–0.1)
Basophils Relative: 0.4 % (ref 0.0–3.0)
Eosinophils Absolute: 0.1 K/uL (ref 0.0–0.7)
Eosinophils Relative: 1.4 % (ref 0.0–5.0)
HCT: 46.7 % (ref 39.0–52.0)
Hemoglobin: 15.9 g/dL (ref 13.0–17.0)
Lymphocytes Relative: 28.5 % (ref 12.0–46.0)
Lymphs Abs: 1.9 K/uL (ref 0.7–4.0)
MCHC: 34.1 g/dL (ref 30.0–36.0)
MCV: 87.5 fl (ref 78.0–100.0)
Monocytes Absolute: 0.5 K/uL (ref 0.1–1.0)
Monocytes Relative: 7.7 % (ref 3.0–12.0)
Neutro Abs: 4.1 K/uL (ref 1.4–7.7)
Neutrophils Relative %: 62 % (ref 43.0–77.0)
Platelets: 147 K/uL — ABNORMAL LOW (ref 150.0–400.0)
RBC: 5.34 Mil/uL (ref 4.22–5.81)
RDW: 13.7 % (ref 11.5–15.5)
WBC: 6.6 K/uL (ref 4.0–10.5)

## 2023-10-10 LAB — TSH: TSH: 1.48 u[IU]/mL (ref 0.35–5.50)

## 2023-10-10 LAB — VITAMIN D 25 HYDROXY (VIT D DEFICIENCY, FRACTURES): VITD: 39.36 ng/mL (ref 30.00–100.00)

## 2023-10-10 LAB — B12 AND FOLATE PANEL
Folate: 9.4 ng/mL (ref >5.9)
Vitamin B-12: 188 pg/mL — ABNORMAL LOW (ref 211–911)

## 2023-10-11 MED ORDER — ROSUVASTATIN CALCIUM 20 MG PO TABS: 20.0000 mg | ORAL_TABLET | Freq: Every day | ORAL | 1 refills | Status: DC

## 2023-10-16 NOTE — Progress Notes (Unsigned)
 Cardiology Office Note:    Date:  10/20/2023   ID:  Anthony Boone, DOB 1955-12-04, MRN [id number]  PCP:  Almarie Waddell NOVAK, NP  Cardiologist:  Redell Leiter, MD   Referring MD: Almarie Waddell NOVAK, NP  ASSESSMENT:    1. Tachycardia   2. Syncope and collapse   3. Exertional chest pain   4. Coronary artery calcification seen on CAT scan   5. Primary hypertension   6. Mixed hyperlipidemia    PLAN:    In order of problems listed above:  Resting sinus tachycardia is resolved secondary to his injury Very concerning to have exertional syncope await the results of his monitor through his PCP He is having chronic chest pain syndrome that seems very characteristic for angina he has coronary artery calcification and you will undergo a cardiac CTA to assess severity of CAD Well-controlled hypertension on appropriate therapy including his beta-blocker and thiazide and ACE inhibitor continue the same Now on lipid-lowering therapy continue his high intensity statin Also continue low-dose aspirin   Next appointment 6 weeks   Medication Adjustments/Labs and Tests Ordered: Current medicines are reviewed at length with the patient today.  Concerns regarding medicines are outlined above.  Orders Placed This Encounter  Procedures   CT CORONARY MORPH W/CTA COR W/SCORE W/CA W/CM &/OR WO/CM   Apolipoprotein B   Lipoprotein A (LPA)   Basic Metabolic Panel (BMET)   EKG 12-Lead   Meds ordered this encounter  Medications   metoprolol  tartrate (LOPRESSOR ) 100 MG tablet    Sig: Take 1 tablet (100 mg total) by mouth once for 1 dose. Please take this medication 2 hours before CT    Dispense:  1 tablet    Refill:  0       History of Present Illness:    Anthony Boone is a 68 y.o. male former smoker who is being seen today for the evaluation of palpitation and tachycardia at the request of Almarie Waddell NOVAK, NP.  He has a background history of hypertension hyperlipidemia MASLD and had recent trauma with  pneumothorax and was found to have coronary artery calcification on CT scan.  He was seen to establish care 10/09/2023 following the hospitalization had a resting heart rate of 105 bpm and appears as if he had a long-term monitor applied through his PCP office.  His office EKG that day showed sinus tachycardia 105 bpm left atrial abnormality otherwise normal EKG Labs performed same day showed normal TSH hemoglobin 15.9 platelets mildly diminished 147 normal renal function creatinine 0.98 potassium 4.0 GFR 80 cc/min normal liver function test lipid profile with a cholesterol 157 LDL 55 non-HDL cholesterol elevators 888.  He was initiated on a statin with an elevated 10-year cardiovascular risk score of 18.5 and coronary artery calcification.  He is seen along with his wife I thought for resting tachycardia.  He is wearing an event monitor from his PCP There is more to the story his wife is concerned because she said he blacked out when he had his trauma he said that he was doing vigorous running and felt as if he would lose consciousness and after I spent more time with him it sounds like indeed he had exercise-induced syncope His CT of the chest showed coronary calcifications and his wife tells me that he has exertional shortness of breath and chest pain.  When he does his walking on the treadmill 20 minutes or if he rushes he gets substernal burning shortness of breath and if  he persist with his activities like on the treadmill he can walk through his chest discomfort.  I think he is having typical angina.  He has not had palpitation edema orthopnea he has no known heart disease and fortunately now is on lipid lowering therapy that is well-tolerated  Past Medical History:  Diagnosis Date   Coronary artery calcification seen on CAT scan 10/09/2023   Erectile dysfunction 01/12/2018   Former smoker 01/15/2018   Hyperlipidemia    Hypertension    Medication management 07/29/2013   Obesity (BMI 30.0-34.9)  01/12/2018   Other abnormal glucose 07/29/2013   Vitamin D  deficiency     Past Surgical History:  Procedure Laterality Date   APPENDECTOMY  1974   VASECTOMY  2005   Dr Donelle    Current Medications: Current Meds  Medication Sig   aspirin  EC 81 MG tablet Takes 1 tablet Daily   bisoprolol -hydrochlorothiazide  (ZIAC ) 10-6.25 MG tablet Take  1 tablet  Daily   for BP   CHOLECALCIFEROL PO Take 5,000 Units by mouth daily.    lisinopril  (ZESTRIL ) 40 MG tablet Take  1 tablet   Daily   for BP   metoprolol  tartrate (LOPRESSOR ) 100 MG tablet Take 1 tablet (100 mg total) by mouth once for 1 dose. Please take this medication 2 hours before CT   rosuvastatin  (CRESTOR ) 20 MG tablet Take 1 tablet (20 mg total) by mouth daily.   sildenafil  (VIAGRA ) 50 MG tablet Take 1 tablet (50 mg total) by mouth as needed for erectile dysfunction. Take 1 hour before sexual intercourse.     Allergies:   Patient has no known allergies.   Social History   Socioeconomic History   Marital status: Married    Spouse name: Not on file   Number of children: Not on file   Years of education: Not on file   Highest education level: Not on file  Occupational History   Not on file  Tobacco Use   Smoking status: Former    Current packs/day: 0.00    Average packs/day: 1.5 packs/day for 29.0 years (43.5 ttl pk-yrs)    Types: Cigarettes    Start date: 15    Quit date: 51    Years since quitting: 31.6   Smokeless tobacco: Never  Substance and Sexual Activity   Alcohol use: Yes   Drug use: No   Sexual activity: Not on file  Other Topics Concern   Not on file  Social History Narrative   Not on file   Social Drivers of Health   Financial Resource Strain: Low Risk  (09/24/2023)   Received from [facility]   Overall Financial Resource Strain (CARDIA)    How hard is it for you to pay for the very basics like food, housing, medical care, and heating?: Not hard at all  Food Insecurity: No Food Insecurity  (09/24/2023)   Received from [facility]   Hunger Vital Sign    Within the past 12 months, you worried that your food would run out before you got the money to buy more.: Never true    Within the past 12 months, the food you bought just didn't last and you didn't have money to get more.: Never true  Transportation Needs: No Transportation Needs (09/24/2023)   Received [facility] - Transportation    In the past 12 months, has lack of transportation kept you from medical appointments or from getting medications?: No    In the past  12 months, has lack of transportation kept you from meetings, work, or from getting things needed for daily living?: No  Physical Activity: Insufficiently Active (09/24/2023)   Received from [facility]   Exercise Vital Sign    On average, how many days per week do you engage in moderate to strenuous exercise (like a brisk walk)?: 2 days    On average, how many minutes do you engage in exercise at this level?: 30 min  Stress: No Stress Concern Present (09/24/2023)   Received from [facility] - Occupational Stress Questionnaire    Do you feel stress - tense, restless, nervous, or anxious, or unable to sleep at night because your mind is troubled all the time - these days?: Not at all  Recent Concern: Stress - Stress Concern Present (09/17/2023)   Received from [facility] of Occupational Health - Occupational Stress Questionnaire    Feeling of Stress : To some extent  Social Connections: Socially Integrated (09/24/2023)   Received from [facility]   Social Network    How would you rate your social network (family, work, friends)?: Good participation with social networks     Family History: The patient's family history includes Heart disease in his father; Hypertension in his brother.  ROS:   ROS Please see the history of present illness.     All other systems reviewed and are  negative.  EKGs/Labs/Other Studies Reviewed:    The following studies were reviewed today: EKG Interpretation Date/Time:  Friday October 20 2023 08:25:42 EDT Ventricular Rate:  69 PR Interval:  194 QRS Duration:  90 QT Interval:  380 QTC Calculation: 407 R Axis:   -16  Text Interpretation: Normal sinus rhythm Minimal voltage criteria for LVH, may be normal variant Borderline ECG No previous ECGs available Confirmed by Monetta Rogue ([id number]) on 10/20/2023 8:31:38 AM    Recent Labs: 12/23/2022: Magnesium 2.3 10/09/2023: ALT 20; BUN 16; Creatinine, Ser 0.98; Hemoglobin 15.9; Platelets 147.0; Potassium 4.0; Sodium 138; TSH 1.48  Recent Lipid Panel    Component Value Date/Time   CHOL 157 10/09/2023 1546   TRIG 326.0 (H) 10/09/2023 1546   HDL 36.20 (L) 10/09/2023 1546   CHOLHDL 4 10/09/2023 1546   VLDL 65.2 (H) 10/09/2023 1546   LDLCALC 55 10/09/2023 1546   LDLCALC 95 12/23/2022 1117    Physical Exam:    VS:  BP 132/80   Pulse 69   Ht 5' 8 (1.727 m)   Wt 218 lb 3.2 oz (99 kg)   SpO2 95%   BMI 33.18 kg/m     Wt Readings from Last 3 Encounters:  10/20/23 218 lb 3.2 oz (99 kg)  10/09/23 218 lb (98.9 kg)  12/23/22 221 lb 12.8 oz (100.6 kg)     GEN: He has no xanthoma or xanthelasma well nourished, well developed in no acute distress HEENT: Normal NECK: No JVD; No carotid bruits LYMPHATICS: No lymphadenopathy CARDIAC: RRR, no murmurs, rubs, gallops RESPIRATORY:  Clear to auscultation without rales, wheezing or rhonchi  ABDOMEN: Soft, non-tender, non-distended MUSCULOSKELETAL:  No edema; No deformity  SKIN: Warm and dry NEUROLOGIC:  Alert and oriented x 3 PSYCHIATRIC:  Normal affect     Signed, Rogue Monetta, MD  10/20/2023 9:44 AM    [facility]

## 2023-10-20 ENCOUNTER — Encounter: Payer: Self-pay | Admitting: Cardiology

## 2023-10-20 ENCOUNTER — Ambulatory Visit: Attending: Cardiology | Admitting: Cardiology

## 2023-10-20 VITALS — BP 132/80 | HR 69 | Ht 68.0 in | Wt 218.2 lb

## 2023-10-20 DIAGNOSIS — I10 Essential (primary) hypertension: Secondary | ICD-10-CM | POA: Insufficient documentation

## 2023-10-20 DIAGNOSIS — R000 Tachycardia, unspecified: Secondary | ICD-10-CM | POA: Diagnosis not present

## 2023-10-20 DIAGNOSIS — R079 Chest pain, unspecified: Secondary | ICD-10-CM | POA: Insufficient documentation

## 2023-10-20 DIAGNOSIS — E782 Mixed hyperlipidemia: Secondary | ICD-10-CM | POA: Diagnosis not present

## 2023-10-20 DIAGNOSIS — R55 Syncope and collapse: Secondary | ICD-10-CM | POA: Insufficient documentation

## 2023-10-20 DIAGNOSIS — I2510 Atherosclerotic heart disease of native coronary artery without angina pectoris: Secondary | ICD-10-CM | POA: Insufficient documentation

## 2023-10-20 MED ORDER — METOPROLOL TARTRATE 100 MG PO TABS: 100.0000 mg | ORAL_TABLET | Freq: Once | ORAL | 0 refills | Status: DC

## 2023-10-20 NOTE — Patient Instructions (Signed)
 Medication Instructions:  Your physician recommends that you continue on your current medications as directed. Please refer to the Current Medication list given to you today.  *If you need a refill on your cardiac medications before your next appointment, please call your pharmacy*  Lab Work: Your physician recommends that you return for lab work in:   Labs today: BMP, Apo B, Lpa  If you have labs (blood work) drawn today and your tests are completely normal, you will receive your results only by: MyChart Message (if you have MyChart) OR A paper copy in the mail If you have any lab test that is abnormal or we need to change your treatment, we will call you to review the results.  Testing/Procedures:   Your cardiac CT will be scheduled at one of the below locations:   [facility] [address] [place], [place] [address] [phone]  OR   [facility] [address] [place], [place] [address] [phone]  OR   [facility] [address] [place], [place] [address] [phone]  OR   [facility] and Vascular Tower [address]  [place], [place] [address]  OR   [facility] [address] , [place]  If scheduled at [facility], please arrive at the [facility] and Children's Entrance (Entrance C2) of [facility] 30 minutes prior to test start time. You can use the FREE valet parking offered at entrance C (encouraged to control the heart rate for the test)  Proceed to the [facility] Radiology Department (first floor) to check-in and test prep.  All radiology patients and guests should use entrance C2 at [facility], accessed from [facility], even though the hospital's physical address listed is [address].  If scheduled at the Heart and Vascular Tower at [address], please enter the parking lot using the [address] entrance and use the  FREE valet service at the patient drop-off area. Enter the building and check-in with registration on the main floor.  If scheduled at [facility], please arrive to the Heart and Vascular Center 15 mins early for check-in and test prep.  There is spacious parking and easy access to the radiology department from the [facility] Heart and Vascular entrance. Please enter here and check-in with the desk attendant.   If scheduled at [facility], please arrive 30 minutes early for check-in and test prep.  Please follow these instructions carefully (unless otherwise directed):  An IV will be required for this test and Nitroglycerin will be given.  Hold all erectile dysfunction medications at least 3 days (72 hrs) prior to test. (Ie viagra , cialis , sildenafil , tadalafil , etc)   On the Night Before the Test: Be sure to Drink plenty of water. Do not consume any caffeinated/decaffeinated beverages or chocolate 12 hours prior to your test. Do not take any antihistamines 12 hours prior to your test.  On the Day of the Test: Drink plenty of water until 1 hour prior to the test. Do not eat any food 1 hour prior to test. You may take your regular medications prior to the test.  Take metoprolol  (Lopressor ) two hours prior to test. If you take Ziac  please HOLD on the morning of the test. Patients who wear a continuous glucose monitor MUST remove the device prior to scanning.      After the Test: Drink plenty of water. After receiving IV contrast, you  may experience a mild flushed feeling. This is normal. On occasion, you may experience a mild rash up to 24 hours after the test. This is not dangerous. If this occurs, you can take Benadryl 25 mg, Zyrtec, Claritin, or Allegra and increase your fluid intake. (Patients taking Tikosyn should avoid Benadryl, and may take Zyrtec, Claritin, or Allegra) If you experience trouble breathing, this can be serious. If it is severe call 911  IMMEDIATELY. If it is mild, please call our office.  We will call to schedule your test 2-4 weeks out understanding that some insurance companies will need an authorization prior to the service being performed.   For more information and frequently asked questions, please visit our website : [email or web address]  For non-scheduling related questions, please contact the cardiac imaging nurse navigator should you have any questions/concerns: Cardiac Imaging Nurse Navigators Direct Office Dial: [phone]   For scheduling needs, including cancellations and rescheduling, please call [place], [phone].   Follow-Up: At [facility], you and your health needs are our priority.  As part of our continuing mission to provide you with exceptional heart care, our providers are all part of one team.  This team includes your primary Cardiologist (physician) and Advanced Practice Providers or APPs (Physician Assistants and Nurse Practitioners) who all work together to provide you with the care you need, when you need it.  Your next appointment:   8 week(s)  Provider:   Redell Leiter, MD    We recommend signing up for the patient portal called MyChart.  Sign up information is provided on this After Visit Summary.  MyChart is used to connect with patients for Virtual Visits (Telemedicine).  Patients are able to view lab/test results, encounter notes, upcoming appointments, etc.  Non-urgent messages can be sent to your provider as well.   To learn more about what you can do with MyChart, go to [email or web address].   Other Instructions None

## 2023-10-23 ENCOUNTER — Ambulatory Visit: Payer: Self-pay | Admitting: Cardiology

## 2023-10-23 LAB — APOLIPOPROTEIN B: Apolipoprotein B: 70 mg/dL (ref <90)

## 2023-10-23 LAB — BASIC METABOLIC PANEL WITH GFR
BUN/Creatinine Ratio: 19 (ref 10–24)
BUN: 19 mg/dL (ref 8–27)
CO2: 17 mmol/L — ABNORMAL LOW (ref 20–29)
Calcium: 9.2 mg/dL (ref 8.6–10.2)
Chloride: 106 mmol/L (ref 96–106)
Creatinine, Ser: 1.01 mg/dL (ref 0.76–1.27)
Glucose: 98 mg/dL (ref 70–99)
Potassium: 4.4 mmol/L (ref 3.5–5.2)
Sodium: 143 mmol/L (ref 134–144)
eGFR: 82 mL/min/1.73 (ref >59)

## 2023-10-23 LAB — LIPOPROTEIN A (LPA): Lipoprotein (a): 109.9 nmol/L — ABNORMAL HIGH (ref <75.0)

## 2023-10-24 ENCOUNTER — Other Ambulatory Visit (HOSPITAL_BASED_OUTPATIENT_CLINIC_OR_DEPARTMENT_OTHER)

## 2023-10-25 ENCOUNTER — Other Ambulatory Visit: Payer: Self-pay

## 2023-10-26 ENCOUNTER — Telehealth: Payer: Self-pay | Admitting: Family Medicine

## 2023-10-26 NOTE — Telephone Encounter (Signed)
 Copied from CRM [id number]. Topic: Medicare AWV >> Oct 26, 2023 10:17 AM Nathanel DEL wrote: Reason for CRM: Called LVM 10/26/2023 to schedule AWV. Please schedule office or virtual visits.  Nathanel Paschal; Care Guide Ambulatory Clinical Support [place] [facility] Direct Dial: [phone]

## 2023-10-27 ENCOUNTER — Encounter (HOSPITAL_COMMUNITY): Payer: Self-pay

## 2023-10-30 ENCOUNTER — Telehealth (HOSPITAL_COMMUNITY): Payer: Self-pay | Admitting: *Deleted

## 2023-10-30 NOTE — Telephone Encounter (Signed)
 Patient returning call about his upcoming cardiac imaging study; pt verbalizes understanding of appt date/time, parking situation and where to check in, pre-test NPO status and medications ordered, and verified current allergies; name and call back number provided for further questions should they arise  Anthony Brick RN Navigator Cardiac Imaging [facility] [phone] office [phone] cell  Patient to take 100mg  metoprolol tartrate two hours prior to his cardiac CT scan.

## 2023-10-30 NOTE — Telephone Encounter (Signed)
 Attempted to call patient regarding upcoming cardiac CT appointment. Left message on voicemail with name and callback number  Larey Brick RN Navigator Cardiac Imaging [facility] V[facility] Services [phone] Office [phone] Cell

## 2023-10-31 ENCOUNTER — Ambulatory Visit (HOSPITAL_BASED_OUTPATIENT_CLINIC_OR_DEPARTMENT_OTHER)
Admission: RE | Admit: 2023-10-31 | Discharge: 2023-10-31 | Disposition: A | Discharge status: Home / Self Care | Source: Ambulatory Visit | Attending: Cardiology | Admitting: Cardiology

## 2023-10-31 DIAGNOSIS — R000 Tachycardia, unspecified: Secondary | ICD-10-CM | POA: Diagnosis not present

## 2023-10-31 DIAGNOSIS — I10 Essential (primary) hypertension: Secondary | ICD-10-CM | POA: Diagnosis not present

## 2023-10-31 DIAGNOSIS — I2510 Atherosclerotic heart disease of native coronary artery without angina pectoris: Secondary | ICD-10-CM | POA: Diagnosis not present

## 2023-10-31 DIAGNOSIS — E782 Mixed hyperlipidemia: Secondary | ICD-10-CM | POA: Insufficient documentation

## 2023-10-31 MED ORDER — NITROGLYCERIN 0.4 MG SL SUBL
0.8000 mg | SUBLINGUAL_TABLET | Freq: Once | SUBLINGUAL | Status: AC
  Administered 2023-10-31: 0.8 mg via SUBLINGUAL

## 2023-10-31 MED ORDER — IOHEXOL 350 MG/ML SOLN
100.0000 mL | Freq: Once | INTRAVENOUS | Status: AC | PRN
  Administered 2023-10-31: 95 mL via INTRAVENOUS

## 2023-11-01 ENCOUNTER — Other Ambulatory Visit: Payer: Self-pay

## 2023-11-01 DIAGNOSIS — E782 Mixed hyperlipidemia: Secondary | ICD-10-CM

## 2023-11-02 DIAGNOSIS — R000 Tachycardia, unspecified: Secondary | ICD-10-CM | POA: Diagnosis not present

## 2023-11-02 DIAGNOSIS — R002 Palpitations: Secondary | ICD-10-CM | POA: Diagnosis not present

## 2023-11-03 ENCOUNTER — Ambulatory Visit (HOSPITAL_BASED_OUTPATIENT_CLINIC_OR_DEPARTMENT_OTHER)
Admission: RE | Admit: 2023-11-03 | Discharge: 2023-11-03 | Disposition: A | Discharge status: Home / Self Care | Source: Ambulatory Visit | Attending: Cardiology | Admitting: Cardiology

## 2023-11-03 ENCOUNTER — Other Ambulatory Visit: Payer: Self-pay | Admitting: Cardiology

## 2023-11-03 DIAGNOSIS — R931 Abnormal findings on diagnostic imaging of heart and coronary circulation: Secondary | ICD-10-CM | POA: Insufficient documentation

## 2023-11-03 NOTE — Progress Notes (Signed)
 FFR Order

## 2023-11-04 DIAGNOSIS — R000 Tachycardia, unspecified: Secondary | ICD-10-CM

## 2023-11-04 DIAGNOSIS — R002 Palpitations: Secondary | ICD-10-CM

## 2023-11-06 ENCOUNTER — Encounter: Payer: Self-pay | Admitting: Cardiology

## 2023-11-08 ENCOUNTER — Ambulatory Visit: Payer: Self-pay | Admitting: Cardiology

## 2023-11-08 ENCOUNTER — Encounter: Payer: Self-pay | Admitting: Cardiology

## 2023-11-10 ENCOUNTER — Encounter: Payer: Self-pay | Admitting: Cardiology

## 2023-11-10 ENCOUNTER — Ambulatory Visit: Attending: Cardiology | Admitting: Cardiology

## 2023-11-10 VITALS — BP 124/80 | HR 62 | Ht 69.0 in | Wt 221.8 lb

## 2023-11-10 DIAGNOSIS — I10 Essential (primary) hypertension: Secondary | ICD-10-CM | POA: Insufficient documentation

## 2023-11-10 DIAGNOSIS — R931 Abnormal findings on diagnostic imaging of heart and coronary circulation: Secondary | ICD-10-CM | POA: Insufficient documentation

## 2023-11-10 DIAGNOSIS — I25118 Atherosclerotic heart disease of native coronary artery with other forms of angina pectoris: Secondary | ICD-10-CM | POA: Insufficient documentation

## 2023-11-10 DIAGNOSIS — R55 Syncope and collapse: Secondary | ICD-10-CM | POA: Diagnosis not present

## 2023-11-10 DIAGNOSIS — E7841 Elevated Lipoprotein(a): Secondary | ICD-10-CM | POA: Insufficient documentation

## 2023-11-10 NOTE — Progress Notes (Signed)
 Cardiology Office Note:    Date:  11/10/2023   ID:  Anthony Boone, DOB June 09, 1955, MRN [id number]  PCP:  Almarie Waddell NOVAK, NP  Cardiologist:  Redell Leiter, MD    Referring MD: Almarie Waddell NOVAK, NP    ASSESSMENT:    1. Coronary artery disease of native artery of native heart with stable angina pectoris (HCC)   2. High coronary artery calcium  score   3. Elevated lipoprotein(a)   4. Primary hypertension   5. Syncope and collapse    PLAN:    In order of problems listed above:  With initial presentation of effort collapse and trauma he was found to have coronary calcification further evaluation shows genetic disorder elevated LP(a) very high coronary calcium  score and multivessel three-vessel coronary artery disease at least by FFR however the angiogram itself suggested more moderate stenosis I think he would benefit from diagnostic coronary angiography invasive flow reserve measurement and decision made if he requires revascularization of the merits of PCI versus CABG.  He is on good foundational therapy including aspirin  high intensity statin beta-blocker and antihypertensive ACE inhibitor. Vascular screen to assess for carotid stenosis aortic aneurysm and PAD Screening siblings and children as well as his wife Stable hypertension continue his current treatment   Next appointment: 3 months   Medication Adjustments/Labs and Tests Ordered: Current medicines are reviewed at length with the patient today.  Concerns regarding medicines are outlined above.  Orders Placed This Encounter  Procedures   EKG 12-Lead   No orders of the defined types were placed in this encounter.    History of Present Illness:    Anthony Boone is a 68 y.o. male with a hx of hypertension hyperlipidemia MASLD recent trauma with pneumothorax and at that time found to have incidental calcification of the coronary artery on CT scan.  When seen in the office his presentation became more complex as he related he  had exertional syncope causing his trauma and was having exertional anginal discomfort.  Last seen 10/20/2023.  Follow-up study showed elevated LP(a) 110 and he had a cardiac CTA performed.  His cardiac CTA showed distal 25 to 49% stenosis left anterior descending coronary artery was described as large and diffusely diseased with calcific plaques and stenosis 50 to 69% proximally and second diagonal branch 50 to 69% ramus proximal 25 to 49% stenosis left circumflex coronary artery showed proximal severe stenosis 70 to 99% OM1 50 to 69% in the right coronary artery is dominant moderate stenosis mid portion 50 to 69% coronary calcium  score was severely elevated 1319.  He subsequent underwent FFR determination showing reduced FFR in the LAD mid left circumflex mid right and right coronary artery mid.  Conclusion high likelihood of hemodynamically significant stenosis mid LAD proximal left circumflex and mid right coronary artery.  Compliance with diet, lifestyle and medications: Yes  Anthony Boone and his wife are both in the office long discussion about the results of his cardiac CTA and FFR implying severe three-vessel coronary artery disease He is not having angina although his initial presentation is 1 of exercise collapse although he attributes it to orthopedic problems I think is probably a marker of cardiac ischemia He is aware of his elevated LP(a) and his siblings wife and children will be screened I suspect he will require revascularization either multivessel PCI or CABG however at times high coronary calcium  scores can cause difficulty in interpretation of CTA Unless Ty go-ahead get a vascular screen performed Risk options and benefits  detailed send obtain Past Medical History:  Diagnosis Date   Coronary artery calcification seen on CAT scan 10/09/2023   Erectile dysfunction 01/12/2018   Former smoker 01/15/2018   Hyperlipidemia    Hypertension    Medication management 07/29/2013   Obesity  (BMI 30.0-34.9) 01/12/2018   Other abnormal glucose 07/29/2013   Vitamin D  deficiency     Current Medications: Current Meds  Medication Sig   aspirin  EC 81 MG tablet Takes 1 tablet Daily   bisoprolol -hydrochlorothiazide  (ZIAC ) 10-6.25 MG tablet Take  1 tablet  Daily   for BP   CHOLECALCIFEROL PO Take 5,000 Units by mouth daily.    lisinopril  (ZESTRIL ) 40 MG tablet Take  1 tablet   Daily   for BP   metoprolol  tartrate (LOPRESSOR ) 100 MG tablet Take 1 tablet (100 mg total) by mouth once for 1 dose. Please take this medication 2 hours before CT   rosuvastatin  (CRESTOR ) 20 MG tablet Take 1 tablet (20 mg total) by mouth daily.   sildenafil  (VIAGRA ) 50 MG tablet Take 1 tablet (50 mg total) by mouth as needed for erectile dysfunction. Take 1 hour before sexual intercourse.      EKGs/Labs/Other Studies Reviewed:    The following studies were reviewed today:  Cardiac Studies & Procedures   ______________________________________________________________________________________________        SHERRILEE  LONG TERM MONITOR (3-14 DAYS) 11/02/2023  Narrative Patch Wear Time:  13 days and 15 hours (2025-08-01T19:09:38-0400 to 2025-08-15T10:22:16-0400)  HR 35 - 132, average 71 bpm. 1 nonsustained SVT (longest 8 beats). No atrial fibrillation detected. Rare supraventricular ectopy. Rare ventricular ectopy. No sustained arrhythmias. Symptom trigger episodes correspond to sinus rhythm.   [facility]   CT SCANS  CT CORONARY MORPH W/CTA COR W/SCORE 10/31/2023  Addendum 11/03/2023  9:13 PM ADDENDUM REPORT: 11/03/2023 21:11  EXAM: OVER-READ INTERPRETATION  CT CHEST  The following report is an over-read performed by radiologist Dr. Suzen Dials of [facility] on 11/03/2023. This over-read does not include interpretation of cardiac or coronary anatomy or pathology. The coronary calcium  score/coronary CTA interpretation by the cardiologist is  attached.  COMPARISON:  None.  FINDINGS: Cardiovascular: There are no significant extracardiac vascular findings.  Mediastinum/Nodes: There are no enlarged lymph nodes within the visualized mediastinum.  Lungs/Pleura: There is no pleural effusion. Mild right middle lobe linear scarring and/or atelectasis is seen. Mild atelectatic changes are also noted within the posterior aspect of the left lung base.  Upper abdomen: No significant findings in the visualized upper abdomen.  Musculoskeletal/Chest wall: No chest wall mass or suspicious osseous findings within the visualized chest.  IMPRESSION: No significant extracardiac findings within the visualized chest.   Electronically Signed By: Suzen Dials M.D. On: 11/03/2023 21:11  Narrative CLINICAL DATA:  CP  EXAM: Cardiac/Coronary  CTA  TECHNIQUE: The patient was scanned on a GE Apex scanner.  FINDINGS: A 120 kV prospective scan was triggered in the descending thoracic aorta at 111 HU's. Axial non-contrast 3 mm slices were carried out through the heart. The data set was analyzed on a dedicated work station and scored using the Agatson method. Gantry rotation speed was 250 msecs and collimation was .6 mm. No beta blockade and 0.8 mg of sl NTG was given. The 3D data set was reconstructed at 75% of the R-R cycle. Diastolic phases were analyzed on a dedicated work station using MPR, MIP and VRT modes. The patient received 80 cc of contrast.  Aorta: Normal size.  No calcifications.  No dissection.  Aortic Valve:  Trileaflet.  No calcifications.  Coronary Arteries:  Normal coronary origin.  Right dominance.  RCA is a large dominant artery that gives rise to PDA and PLA. This artery is diffusely diseased in its proximal and mid portion with numerous mixed plaques and moderate stenosis of 50-69%.  Left main is a large artery that gives rise to LAD and LCX arteries as well as moderate size Intermediate Branch. In  the distal portion of LM there is mixed plaque with mild stenosis of 25-49%.  LAD is a large vessel that diffusely diseased in its proximal and mid portion with numerous mostly calcified plaques with moderate stenosis of 50-69%. This artery gives rise to small D1 and large D2. D2 has mixed plaque in its proximal portion with moderate stenosis of 50-69%.  Intermediate Branch has mixed plaques in its proximal portion with mild stenosis of 25-49%.  LCX is a non-dominant artery that gives rise to one large OM1 branch. There is mixed plaque in the proximal portion of this artery with severe stenosis of 70-99%. Large OM1 has mixed plaque in its proximal with moderate stenosis of 50-69%.  Other findings:  Normal pulmonary vein drainage into the left atrium. Rt middle pulmonary vein noted - normal variant.  Normal left atrial appendage without a thrombus.  Normal size of the pulmonary artery.  IMPRESSION: 1. Coronary calcium  score of 1393. This was 45 percentile for age and sex matched control.  2. Normal coronary origin with right dominance.  3. CAD-RADS 4 Severe stenosis. (70-99%) prox CX. Cardiac catheterization or CT FFR is recommended. Consider symptom-guided anti-ischemic pharmacotherapy as well as risk factor modification per guideline directed care.  4. Plaque analysis: TPV 1478 mm3, 91 st percentile, calcified plaque 407 mm3, non calcified plaque 1071 mm3, Extensive TPV.  Electronically Signed: By: Lamar Fitch M.D. On: 11/03/2023 18:20     ______________________________________________________________________________________________          Recent Labs: 12/23/2022: Magnesium 2.3 10/09/2023: ALT 20; Hemoglobin 15.9; Platelets 147.0; TSH 1.48 10/20/2023: BUN 19; Creatinine, Ser 1.01; Potassium 4.4; Sodium 143  Recent Lipid Panel    Component Value Date/Time   CHOL 157 10/09/2023 1546   TRIG 326.0 (H) 10/09/2023 1546   HDL 36.20 (L) 10/09/2023 1546    CHOLHDL 4 10/09/2023 1546   VLDL 65.2 (H) 10/09/2023 1546   LDLCALC 55 10/09/2023 1546   LDLCALC 95 12/23/2022 1117    Physical Exam:    VS:  BP 124/80   Pulse 62   Ht 5' 9 (1.753 m)   Wt 221 lb 12.8 oz (100.6 kg)   SpO2 97%   BMI 32.75 kg/m     Wt Readings from Last 3 Encounters:  11/10/23 221 lb 12.8 oz (100.6 kg)  10/20/23 218 lb 3.2 oz (99 kg)  10/09/23 218 lb (98.9 kg)     GEN:  Well nourished, well developed in no acute distress he has no xanthomas and arcus senilis HEENT: Normal NECK: No JVD; No carotid bruits LYMPHATICS: No lymphadenopathy CARDIAC: RRR, no murmurs, rubs, gallops RESPIRATORY:  Clear to auscultation without rales, wheezing or rhonchi  ABDOMEN: Soft, non-tender, non-distended MUSCULOSKELETAL:  No edema; No deformity  SKIN: Warm and dry NEUROLOGIC:  Alert and oriented x 3 PSYCHIATRIC:  Normal affect    Signed, Redell Leiter, MD  11/10/2023 10:01 AM    [facility]

## 2023-11-10 NOTE — Patient Instructions (Addendum)
 Medication Instructions:  Your physician recommends that you continue on your current medications as directed. Please refer to the Current Medication list given to you today.  *If you need a refill on your cardiac medications before your next appointment, please call your pharmacy*  Lab Work: Your physician recommends that you return for lab work in:   Labs today: CBC, BMP  If you have labs (blood work) drawn today and your tests are completely normal, you will receive your results only by: MyChart Message (if you have MyChart) OR A paper copy in the mail If you have any lab test that is abnormal or we need to change your treatment, we will call you to review the results.  Testing/Procedures: [facility]  [facility] A DEPT OF [facility] [place] [address]  [place] [address] Dept: [phone] Loc: [phone]  Anthony Boone  11/10/2023  You are scheduled for a Cardiac Catheterization on Wednesday, September 3 with Dr. Gordy Bergamo.  1. Please arrive at the [facility] (Main Entrance A) at [facility]: [address] [place], [place] [address] at 9:30 AM (This time is 2 hour(s) before your procedure to ensure your preparation).   Free valet parking service is available. You will check in at ADMITTING. The support person will be asked to wait in the waiting room.  It is OK to have someone drop you off and come back when you are ready to be discharged.    Special note: Every effort is made to have your procedure done on time. Please understand that emergencies sometimes delay scheduled procedures.  2. Diet: Nothing to eat after midnight.   3. Hydration: Nothing to eat and drink after midnight. (For TEE and Cath the same day)  4. Labs: You will need to have blood drawn on Friday, August 29 at [facility]: [address], [place] . You do not need to be fasting.  5. Medication instructions in  preparation for your procedure:   Contrast Allergy: No  Hold Ziac  the morning of the procedure  On the morning of your procedure, take your Aspirin  81 mg and any morning medicines NOT listed above.  You may use sips of water.  6. Plan to go home the same day, you will only stay overnight if medically necessary. 7. Bring a current list of your medications and current insurance cards. 8. You MUST have a responsible person to drive you home. 9. Someone MUST be with you the first 24 hours after you arrive home or your discharge will be delayed. 10. Please wear clothes that are easy to get on and off and wear slip-on shoes.  Thank you for allowing us  to care for you!   -- [facility]   Follow-Up: At [facility], you and your health needs are our priority.  As part of our continuing mission to provide you with exceptional heart care, our providers are all part of one team.  This team includes your primary Cardiologist (physician) and Advanced Practice Providers or APPs (Physician Assistants and Nurse Practitioners) who all work together to provide you with the care you need, when you need it.  Your next appointment:   3 month(s)  Provider:   Redell Leiter, MD    We recommend signing up for the patient portal called MyChart.  Sign up information is provided on this After Visit Summary.  MyChart is used to connect with patients  for Virtual Visits (Telemedicine).  Patients are able to view lab/test results, encounter notes, upcoming appointments, etc.  Non-urgent messages can be sent to your provider as well.   To learn more about what you can do with MyChart, go to [email or web address].   Other Instructions None

## 2023-11-10 NOTE — H&P (View-Only) (Signed)
 Cardiology Office Note:    Date:  11/10/2023   ID:  Anthony Boone, DOB June 09, 1955, MRN [id number]  PCP:  Almarie Waddell NOVAK, NP  Cardiologist:  Redell Leiter, MD    Referring MD: Almarie Waddell NOVAK, NP    ASSESSMENT:    1. Coronary artery disease of native artery of native heart with stable angina pectoris (HCC)   2. High coronary artery calcium  score   3. Elevated lipoprotein(a)   4. Primary hypertension   5. Syncope and collapse    PLAN:    In order of problems listed above:  With initial presentation of effort collapse and trauma he was found to have coronary calcification further evaluation shows genetic disorder elevated LP(a) very high coronary calcium  score and multivessel three-vessel coronary artery disease at least by FFR however the angiogram itself suggested more moderate stenosis I think he would benefit from diagnostic coronary angiography invasive flow reserve measurement and decision made if he requires revascularization of the merits of PCI versus CABG.  He is on good foundational therapy including aspirin  high intensity statin beta-blocker and antihypertensive ACE inhibitor. Vascular screen to assess for carotid stenosis aortic aneurysm and PAD Screening siblings and children as well as his wife Stable hypertension continue his current treatment   Next appointment: 3 months   Medication Adjustments/Labs and Tests Ordered: Current medicines are reviewed at length with the patient today.  Concerns regarding medicines are outlined above.  Orders Placed This Encounter  Procedures   EKG 12-Lead   No orders of the defined types were placed in this encounter.    History of Present Illness:    Anthony Boone is a 68 y.o. male with a hx of hypertension hyperlipidemia MASLD recent trauma with pneumothorax and at that time found to have incidental calcification of the coronary artery on CT scan.  When seen in the office his presentation became more complex as he related he  had exertional syncope causing his trauma and was having exertional anginal discomfort.  Last seen 10/20/2023.  Follow-up study showed elevated LP(a) 110 and he had a cardiac CTA performed.  His cardiac CTA showed distal 25 to 49% stenosis left anterior descending coronary artery was described as large and diffusely diseased with calcific plaques and stenosis 50 to 69% proximally and second diagonal branch 50 to 69% ramus proximal 25 to 49% stenosis left circumflex coronary artery showed proximal severe stenosis 70 to 99% OM1 50 to 69% in the right coronary artery is dominant moderate stenosis mid portion 50 to 69% coronary calcium  score was severely elevated 1319.  He subsequent underwent FFR determination showing reduced FFR in the LAD mid left circumflex mid right and right coronary artery mid.  Conclusion high likelihood of hemodynamically significant stenosis mid LAD proximal left circumflex and mid right coronary artery.  Compliance with diet, lifestyle and medications: Yes  Leone and his wife are both in the office long discussion about the results of his cardiac CTA and FFR implying severe three-vessel coronary artery disease He is not having angina although his initial presentation is 1 of exercise collapse although he attributes it to orthopedic problems I think is probably a marker of cardiac ischemia He is aware of his elevated LP(a) and his siblings wife and children will be screened I suspect he will require revascularization either multivessel PCI or CABG however at times high coronary calcium  scores can cause difficulty in interpretation of CTA Unless Ty go-ahead get a vascular screen performed Risk options and benefits  detailed send obtain Past Medical History:  Diagnosis Date   Coronary artery calcification seen on CAT scan 10/09/2023   Erectile dysfunction 01/12/2018   Former smoker 01/15/2018   Hyperlipidemia    Hypertension    Medication management 07/29/2013   Obesity  (BMI 30.0-34.9) 01/12/2018   Other abnormal glucose 07/29/2013   Vitamin D  deficiency     Current Medications: Current Meds  Medication Sig   aspirin  EC 81 MG tablet Takes 1 tablet Daily   bisoprolol -hydrochlorothiazide  (ZIAC ) 10-6.25 MG tablet Take  1 tablet  Daily   for BP   CHOLECALCIFEROL PO Take 5,000 Units by mouth daily.    lisinopril  (ZESTRIL ) 40 MG tablet Take  1 tablet   Daily   for BP   metoprolol  tartrate (LOPRESSOR ) 100 MG tablet Take 1 tablet (100 mg total) by mouth once for 1 dose. Please take this medication 2 hours before CT   rosuvastatin  (CRESTOR ) 20 MG tablet Take 1 tablet (20 mg total) by mouth daily.   sildenafil  (VIAGRA ) 50 MG tablet Take 1 tablet (50 mg total) by mouth as needed for erectile dysfunction. Take 1 hour before sexual intercourse.      EKGs/Labs/Other Studies Reviewed:    The following studies were reviewed today:  Cardiac Studies & Procedures   ______________________________________________________________________________________________        SHERRILEE  LONG TERM MONITOR (3-14 DAYS) 11/02/2023  Narrative Patch Wear Time:  13 days and 15 hours (2025-08-01T19:09:38-0400 to 2025-08-15T10:22:16-0400)  HR 35 - 132, average 71 bpm. 1 nonsustained SVT (longest 8 beats). No atrial fibrillation detected. Rare supraventricular ectopy. Rare ventricular ectopy. No sustained arrhythmias. Symptom trigger episodes correspond to sinus rhythm.   [facility] Cardiac Electrophysiology   CT SCANS  CT CORONARY MORPH W/CTA COR W/SCORE 10/31/2023  Addendum 11/03/2023  9:13 PM ADDENDUM REPORT: 11/03/2023 21:11  EXAM: OVER-READ INTERPRETATION  CT CHEST  The following report is an over-read performed by radiologist Dr. Suzen Dials of [facility] on 11/03/2023. This over-read does not include interpretation of cardiac or coronary anatomy or pathology. The coronary calcium  score/coronary CTA interpretation by the cardiologist is  attached.  COMPARISON:  None.  FINDINGS: Cardiovascular: There are no significant extracardiac vascular findings.  Mediastinum/Nodes: There are no enlarged lymph nodes within the visualized mediastinum.  Lungs/Pleura: There is no pleural effusion. Mild right middle lobe linear scarring and/or atelectasis is seen. Mild atelectatic changes are also noted within the posterior aspect of the left lung base.  Upper abdomen: No significant findings in the visualized upper abdomen.  Musculoskeletal/Chest wall: No chest wall mass or suspicious osseous findings within the visualized chest.  IMPRESSION: No significant extracardiac findings within the visualized chest.   Electronically Signed By: Suzen Dials M.D. On: 11/03/2023 21:11  Narrative CLINICAL DATA:  CP  EXAM: Cardiac/Coronary  CTA  TECHNIQUE: The patient was scanned on a GE Apex scanner.  FINDINGS: A 120 kV prospective scan was triggered in the descending thoracic aorta at 111 HU's. Axial non-contrast 3 mm slices were carried out through the heart. The data set was analyzed on a dedicated work station and scored using the Agatson method. Gantry rotation speed was 250 msecs and collimation was .6 mm. No beta blockade and 0.8 mg of sl NTG was given. The 3D data set was reconstructed at 75% of the R-R cycle. Diastolic phases were analyzed on a dedicated work station using MPR, MIP and VRT modes. The patient received 80 cc of contrast.  Aorta: Normal size.  No calcifications.  No dissection.  Aortic Valve:  Trileaflet.  No calcifications.  Coronary Arteries:  Normal coronary origin.  Right dominance.  RCA is a large dominant artery that gives rise to PDA and PLA. This artery is diffusely diseased in its proximal and mid portion with numerous mixed plaques and moderate stenosis of 50-69%.  Left main is a large artery that gives rise to LAD and LCX arteries as well as moderate size Intermediate Branch. In  the distal portion of LM there is mixed plaque with mild stenosis of 25-49%.  LAD is a large vessel that diffusely diseased in its proximal and mid portion with numerous mostly calcified plaques with moderate stenosis of 50-69%. This artery gives rise to small D1 and large D2. D2 has mixed plaque in its proximal portion with moderate stenosis of 50-69%.  Intermediate Branch has mixed plaques in its proximal portion with mild stenosis of 25-49%.  LCX is a non-dominant artery that gives rise to one large OM1 branch. There is mixed plaque in the proximal portion of this artery with severe stenosis of 70-99%. Large OM1 has mixed plaque in its proximal with moderate stenosis of 50-69%.  Other findings:  Normal pulmonary vein drainage into the left atrium. Rt middle pulmonary vein noted - normal variant.  Normal left atrial appendage without a thrombus.  Normal size of the pulmonary artery.  IMPRESSION: 1. Coronary calcium  score of 1393. This was 45 percentile for age and sex matched control.  2. Normal coronary origin with right dominance.  3. CAD-RADS 4 Severe stenosis. (70-99%) prox CX. Cardiac catheterization or CT FFR is recommended. Consider symptom-guided anti-ischemic pharmacotherapy as well as risk factor modification per guideline directed care.  4. Plaque analysis: TPV 1478 mm3, 91 st percentile, calcified plaque 407 mm3, non calcified plaque 1071 mm3, Extensive TPV.  Electronically Signed: By: Lamar Fitch M.D. On: 11/03/2023 18:20     ______________________________________________________________________________________________          Recent Labs: 12/23/2022: Magnesium 2.3 10/09/2023: ALT 20; Hemoglobin 15.9; Platelets 147.0; TSH 1.48 10/20/2023: BUN 19; Creatinine, Ser 1.01; Potassium 4.4; Sodium 143  Recent Lipid Panel    Component Value Date/Time   CHOL 157 10/09/2023 1546   TRIG 326.0 (H) 10/09/2023 1546   HDL 36.20 (L) 10/09/2023 1546    CHOLHDL 4 10/09/2023 1546   VLDL 65.2 (H) 10/09/2023 1546   LDLCALC 55 10/09/2023 1546   LDLCALC 95 12/23/2022 1117    Physical Exam:    VS:  BP 124/80   Pulse 62   Ht 5' 9 (1.753 m)   Wt 221 lb 12.8 oz (100.6 kg)   SpO2 97%   BMI 32.75 kg/m     Wt Readings from Last 3 Encounters:  11/10/23 221 lb 12.8 oz (100.6 kg)  10/20/23 218 lb 3.2 oz (99 kg)  10/09/23 218 lb (98.9 kg)     GEN:  Well nourished, well developed in no acute distress he has no xanthomas and arcus senilis HEENT: Normal NECK: No JVD; No carotid bruits LYMPHATICS: No lymphadenopathy CARDIAC: RRR, no murmurs, rubs, gallops RESPIRATORY:  Clear to auscultation without rales, wheezing or rhonchi  ABDOMEN: Soft, non-tender, non-distended MUSCULOSKELETAL:  No edema; No deformity  SKIN: Warm and dry NEUROLOGIC:  Alert and oriented x 3 PSYCHIATRIC:  Normal affect    Signed, Redell Leiter, MD  11/10/2023 10:01 AM    [facility]

## 2023-11-11 ENCOUNTER — Ambulatory Visit: Payer: Self-pay | Admitting: Cardiology

## 2023-11-11 LAB — BASIC METABOLIC PANEL WITH GFR
BUN/Creatinine Ratio: 15 (ref 10–24)
BUN: 16 mg/dL (ref 8–27)
CO2: 23 mmol/L (ref 20–29)
Calcium: 9 mg/dL (ref 8.6–10.2)
Chloride: 102 mmol/L (ref 96–106)
Creatinine, Ser: 1.09 mg/dL (ref 0.76–1.27)
Glucose: 96 mg/dL (ref 70–99)
Potassium: 4.7 mmol/L (ref 3.5–5.2)
Sodium: 140 mmol/L (ref 134–144)
eGFR: 74 mL/min/1.73 (ref >59)

## 2023-11-11 LAB — CBC
Hematocrit: 46.6 % (ref 37.5–51.0)
Hemoglobin: 15.7 g/dL (ref 13.0–17.7)
MCH: 30.4 pg (ref 26.6–33.0)
MCHC: 33.7 g/dL (ref 31.5–35.7)
MCV: 90 fL (ref 79–97)
Platelets: 137 x10E3/uL — ABNORMAL LOW (ref 150–450)
RBC: 5.17 x10E6/uL (ref 4.14–5.80)
RDW: 13.2 % (ref 11.6–15.4)
WBC: 5.8 x10E3/uL (ref 3.4–10.8)

## 2023-11-14 ENCOUNTER — Telehealth: Payer: Self-pay | Admitting: *Deleted

## 2023-11-14 NOTE — Telephone Encounter (Signed)
 Cardiac Catheterization scheduled at [facility] for: Wednesday November 15, 2023 11:30 AM Arrival time [facility] Main Entrance A at: 9:30 AM  Diet: -Nothing to eat after midnight.  Hydration: -May drink clear liquids until 2 hours before the procedure.   Approved liquids: Water , clear tea, black coffee, fruit juices-non-citric and without pulp,Gatorade, plain Jello/popsicles.  -Please drink 16 oz bottle of water  2 hours before procedure.  Medication instructions: -Hold:  Ziac -AM of procedure  -Other usual morning medications can be taken including aspirin  81 mg.  Plan to go home the same day, you will only stay overnight if medically necessary.  You must have responsible adult to drive you home.  Someone must be with you the first 24 hours after you arrive home.  Reviewed procedure instructions with patient's wife (DPR),Renee.

## 2023-11-15 ENCOUNTER — Encounter (HOSPITAL_COMMUNITY): Payer: Self-pay | Admitting: Cardiology

## 2023-11-15 ENCOUNTER — Inpatient Hospital Stay (HOSPITAL_COMMUNITY)
Admission: RE | Admit: 2023-11-15 | Discharge: 2023-11-21 | DRG: 234 | Disposition: A | Discharge status: Home Health | Attending: Cardiology | Admitting: Cardiology

## 2023-11-15 ENCOUNTER — Encounter (HOSPITAL_COMMUNITY)
Admission: RE | Disposition: A | Discharge status: Home Health | Payer: Self-pay | Source: Home / Self Care | Attending: Cardiology

## 2023-11-15 ENCOUNTER — Inpatient Hospital Stay (HOSPITAL_COMMUNITY)

## 2023-11-15 ENCOUNTER — Other Ambulatory Visit: Payer: Self-pay

## 2023-11-15 DIAGNOSIS — E7841 Elevated Lipoprotein(a): Secondary | ICD-10-CM | POA: Diagnosis present

## 2023-11-15 DIAGNOSIS — I517 Cardiomegaly: Secondary | ICD-10-CM | POA: Diagnosis not present

## 2023-11-15 DIAGNOSIS — F419 Anxiety disorder, unspecified: Secondary | ICD-10-CM | POA: Diagnosis present

## 2023-11-15 DIAGNOSIS — J90 Pleural effusion, not elsewhere classified: Secondary | ICD-10-CM | POA: Diagnosis present

## 2023-11-15 DIAGNOSIS — Z87891 Personal history of nicotine dependence: Secondary | ICD-10-CM

## 2023-11-15 DIAGNOSIS — N179 Acute kidney failure, unspecified: Secondary | ICD-10-CM | POA: Diagnosis not present

## 2023-11-15 DIAGNOSIS — E559 Vitamin D deficiency, unspecified: Secondary | ICD-10-CM | POA: Diagnosis present

## 2023-11-15 DIAGNOSIS — I2510 Atherosclerotic heart disease of native coronary artery without angina pectoris: Secondary | ICD-10-CM

## 2023-11-15 DIAGNOSIS — R55 Syncope and collapse: Secondary | ICD-10-CM | POA: Diagnosis present

## 2023-11-15 DIAGNOSIS — Z9181 History of falling: Secondary | ICD-10-CM

## 2023-11-15 DIAGNOSIS — I25118 Atherosclerotic heart disease of native coronary artery with other forms of angina pectoris: Secondary | ICD-10-CM | POA: Diagnosis not present

## 2023-11-15 DIAGNOSIS — Z6833 Body mass index (BMI) 33.0-33.9, adult: Secondary | ICD-10-CM | POA: Diagnosis not present

## 2023-11-15 DIAGNOSIS — Z7982 Long term (current) use of aspirin: Secondary | ICD-10-CM | POA: Diagnosis not present

## 2023-11-15 DIAGNOSIS — H5789 Other specified disorders of eye and adnexa: Secondary | ICD-10-CM | POA: Diagnosis not present

## 2023-11-15 DIAGNOSIS — I97190 Other postprocedural cardiac functional disturbances following cardiac surgery: Secondary | ICD-10-CM | POA: Diagnosis not present

## 2023-11-15 DIAGNOSIS — I25110 Atherosclerotic heart disease of native coronary artery with unstable angina pectoris: Secondary | ICD-10-CM | POA: Diagnosis present

## 2023-11-15 DIAGNOSIS — Z951 Presence of aortocoronary bypass graft: Secondary | ICD-10-CM | POA: Diagnosis not present

## 2023-11-15 DIAGNOSIS — Y838 Other surgical procedures as the cause of abnormal reaction of the patient, or of later complication, without mention of misadventure at the time of the procedure: Secondary | ICD-10-CM | POA: Diagnosis not present

## 2023-11-15 DIAGNOSIS — E669 Obesity, unspecified: Secondary | ICD-10-CM | POA: Diagnosis present

## 2023-11-15 DIAGNOSIS — D6959 Other secondary thrombocytopenia: Secondary | ICD-10-CM | POA: Diagnosis not present

## 2023-11-15 DIAGNOSIS — J9811 Atelectasis: Secondary | ICD-10-CM | POA: Diagnosis not present

## 2023-11-15 DIAGNOSIS — N529 Male erectile dysfunction, unspecified: Secondary | ICD-10-CM | POA: Diagnosis present

## 2023-11-15 DIAGNOSIS — Z01810 Encounter for preprocedural cardiovascular examination: Secondary | ICD-10-CM

## 2023-11-15 DIAGNOSIS — I200 Unstable angina: Principal | ICD-10-CM | POA: Diagnosis present

## 2023-11-15 DIAGNOSIS — I088 Other rheumatic multiple valve diseases: Secondary | ICD-10-CM | POA: Diagnosis not present

## 2023-11-15 DIAGNOSIS — I480 Paroxysmal atrial fibrillation: Secondary | ICD-10-CM | POA: Diagnosis not present

## 2023-11-15 DIAGNOSIS — D696 Thrombocytopenia, unspecified: Secondary | ICD-10-CM | POA: Diagnosis not present

## 2023-11-15 DIAGNOSIS — I10 Essential (primary) hypertension: Secondary | ICD-10-CM | POA: Diagnosis present

## 2023-11-15 DIAGNOSIS — Z8249 Family history of ischemic heart disease and other diseases of the circulatory system: Secondary | ICD-10-CM | POA: Diagnosis not present

## 2023-11-15 DIAGNOSIS — Z4682 Encounter for fitting and adjustment of non-vascular catheter: Secondary | ICD-10-CM | POA: Diagnosis not present

## 2023-11-15 DIAGNOSIS — Z720 Tobacco use: Secondary | ICD-10-CM | POA: Diagnosis not present

## 2023-11-15 DIAGNOSIS — Z452 Encounter for adjustment and management of vascular access device: Secondary | ICD-10-CM | POA: Diagnosis not present

## 2023-11-15 DIAGNOSIS — R739 Hyperglycemia, unspecified: Secondary | ICD-10-CM | POA: Diagnosis not present

## 2023-11-15 DIAGNOSIS — Z79899 Other long term (current) drug therapy: Secondary | ICD-10-CM

## 2023-11-15 DIAGNOSIS — E785 Hyperlipidemia, unspecified: Secondary | ICD-10-CM | POA: Diagnosis not present

## 2023-11-15 DIAGNOSIS — I4891 Unspecified atrial fibrillation: Secondary | ICD-10-CM | POA: Diagnosis not present

## 2023-11-15 DIAGNOSIS — R0989 Other specified symptoms and signs involving the circulatory and respiratory systems: Secondary | ICD-10-CM | POA: Diagnosis not present

## 2023-11-15 DIAGNOSIS — Z48812 Encounter for surgical aftercare following surgery on the circulatory system: Secondary | ICD-10-CM | POA: Diagnosis not present

## 2023-11-15 DIAGNOSIS — Z87828 Personal history of other (healed) physical injury and trauma: Secondary | ICD-10-CM | POA: Diagnosis not present

## 2023-11-15 DIAGNOSIS — J939 Pneumothorax, unspecified: Secondary | ICD-10-CM | POA: Diagnosis not present

## 2023-11-15 HISTORY — PX: LEFT HEART CATH AND CORONARY ANGIOGRAPHY: CATH118249

## 2023-11-15 HISTORY — DX: Unstable angina: I20.0

## 2023-11-15 LAB — SURGICAL PCR SCREEN
MRSA, PCR: NEGATIVE
Staphylococcus aureus: NEGATIVE

## 2023-11-15 LAB — ECHOCARDIOGRAM COMPLETE
Area-P 1/2: 3.6 cm2
Calc EF: 49.7 %
Height: 68 in
S' Lateral: 3.5 cm
Single Plane A2C EF: 49.3 %
Single Plane A4C EF: 50.7 %
Weight: 3440 [oz_av]

## 2023-11-15 LAB — VAS US DOPPLER PRE CABG
Left ABI: 0.97
Right ABI: 0.98

## 2023-11-15 MED ORDER — INSULIN REGULAR(HUMAN) IN NACL 100-0.9 UT/100ML-% IV SOLN
INTRAVENOUS | Status: AC
  Administered 2023-11-16: 1.1 [IU]/h via INTRAVENOUS
  Filled 2023-11-15: qty 100

## 2023-11-15 MED ORDER — MILRINONE LACTATE IN DEXTROSE 20-5 MG/100ML-% IV SOLN
0.3000 ug/kg/min | INTRAVENOUS | Status: DC
  Filled 2023-11-15: qty 100

## 2023-11-15 MED ORDER — TRANEXAMIC ACID (OHS) PUMP PRIME SOLUTION
2.0000 mg/kg | INTRAVENOUS | Status: DC
  Filled 2023-11-15: qty 1.95

## 2023-11-15 MED ORDER — BISACODYL 5 MG PO TBEC
5.0000 mg | DELAYED_RELEASE_TABLET | Freq: Once | ORAL | Status: AC
  Administered 2023-11-15: 5 mg via ORAL
  Filled 2023-11-15: qty 1

## 2023-11-15 MED ORDER — INFLUENZA VAC SPLIT HIGH-DOSE 0.5 ML IM SUSY
0.5000 mL | PREFILLED_SYRINGE | INTRAMUSCULAR | Status: DC
  Filled 2023-11-15: qty 0.5

## 2023-11-15 MED ORDER — MUPIROCIN 2 % EX OINT
1.0000 | TOPICAL_OINTMENT | Freq: Two times a day (BID) | CUTANEOUS | Status: AC
  Administered 2023-11-15 – 2023-11-20 (×8): 1 via NASAL
  Filled 2023-11-15 (×2): qty 22

## 2023-11-15 MED ORDER — FENTANYL CITRATE (PF) 100 MCG/2ML IJ SOLN
INTRAMUSCULAR | Status: DC | PRN
  Administered 2023-11-15: 25 ug via INTRAVENOUS

## 2023-11-15 MED ORDER — ASPIRIN 81 MG PO TBEC: 81.0000 mg | DELAYED_RELEASE_TABLET | Freq: Every day | ORAL | Status: DC

## 2023-11-15 MED ORDER — SODIUM CHLORIDE 0.9% FLUSH
3.0000 mL | Freq: Two times a day (BID) | INTRAVENOUS | Status: DC
  Administered 2023-11-15: 3 mL via INTRAVENOUS

## 2023-11-15 MED ORDER — CEFAZOLIN SODIUM-DEXTROSE 2-4 GM/100ML-% IV SOLN
2.0000 g | INTRAVENOUS | Status: AC
  Administered 2023-11-16 (×2): 2 g via INTRAVENOUS
  Filled 2023-11-15: qty 100

## 2023-11-15 MED ORDER — LISINOPRIL 5 MG PO TABS: 5.0000 mg | ORAL_TABLET | Freq: Every day | ORAL | Status: DC

## 2023-11-15 MED ORDER — HEPARIN SODIUM (PORCINE) 1000 UNIT/ML IJ SOLN
INTRAMUSCULAR | Status: DC | PRN
  Administered 2023-11-15: 5000 [IU] via INTRAVENOUS

## 2023-11-15 MED ORDER — ASPIRIN 81 MG PO CHEW: 81.0000 mg | CHEWABLE_TABLET | ORAL | Status: DC

## 2023-11-15 MED ORDER — CHLORHEXIDINE GLUCONATE 0.12 % MT SOLN
15.0000 mL | Freq: Once | OROMUCOSAL | Status: AC
  Administered 2023-11-16: 15 mL via OROMUCOSAL
  Filled 2023-11-15: qty 15

## 2023-11-15 MED ORDER — DEXMEDETOMIDINE HCL IN NACL 400 MCG/100ML IV SOLN
0.1000 ug/kg/h | INTRAVENOUS | Status: AC
  Administered 2023-11-16: .5 ug/kg/h via INTRAVENOUS
  Filled 2023-11-15: qty 100

## 2023-11-15 MED ORDER — HEPARIN SODIUM (PORCINE) 1000 UNIT/ML IJ SOLN
INTRAMUSCULAR | Status: AC
  Filled 2023-11-15: qty 10

## 2023-11-15 MED ORDER — FENTANYL CITRATE (PF) 100 MCG/2ML IJ SOLN
INTRAMUSCULAR | Status: AC
  Filled 2023-11-15: qty 2

## 2023-11-15 MED ORDER — EPINEPHRINE HCL 5 MG/250ML IV SOLN IN NS
0.0000 ug/min | INTRAVENOUS | Status: DC
  Filled 2023-11-15: qty 250

## 2023-11-15 MED ORDER — SODIUM CHLORIDE 0.9% FLUSH: 3.0000 mL | Freq: Two times a day (BID) | INTRAVENOUS | Status: DC

## 2023-11-15 MED ORDER — ONDANSETRON HCL 4 MG/2ML IJ SOLN
4.0000 mg | Freq: Four times a day (QID) | INTRAMUSCULAR | Status: DC | PRN
  Administered 2023-11-16: 4 mg via INTRAVENOUS

## 2023-11-15 MED ORDER — VERAPAMIL HCL 2.5 MG/ML IV SOLN
INTRAVENOUS | Status: AC
Start: 2023-11-15 — End: 2023-11-15
  Filled 2023-11-15: qty 2

## 2023-11-15 MED ORDER — CHLORHEXIDINE GLUCONATE CLOTH 2 % EX PADS
6.0000 | MEDICATED_PAD | Freq: Once | CUTANEOUS | Status: AC
  Administered 2023-11-16: 6 via TOPICAL

## 2023-11-15 MED ORDER — TRANEXAMIC ACID (OHS) BOLUS VIA INFUSION
15.0000 mg/kg | INTRAVENOUS | Status: AC
  Administered 2023-11-16: 1462.5 mg via INTRAVENOUS
  Filled 2023-11-15: qty 1463

## 2023-11-15 MED ORDER — BISOPROLOL-HYDROCHLOROTHIAZIDE 10-6.25 MG PO TABS: 1.0000 | ORAL_TABLET | Freq: Every day | ORAL | Status: DC

## 2023-11-15 MED ORDER — VERAPAMIL HCL 2.5 MG/ML IV SOLN
INTRAVENOUS | Status: DC | PRN
  Administered 2023-11-15: 10 mL via INTRA_ARTERIAL

## 2023-11-15 MED ORDER — BISOPROLOL FUMARATE 5 MG PO TABS
10.0000 mg | ORAL_TABLET | Freq: Every day | ORAL | Status: DC
  Administered 2023-11-15: 10 mg via ORAL
  Filled 2023-11-15: qty 2

## 2023-11-15 MED ORDER — SODIUM CHLORIDE 0.9% FLUSH: 3.0000 mL | INTRAVENOUS | Status: DC | PRN

## 2023-11-15 MED ORDER — NOREPINEPHRINE 4 MG/250ML-% IV SOLN
0.0000 ug/min | INTRAVENOUS | Status: AC
  Administered 2023-11-16: 3 ug/min via INTRAVENOUS
  Filled 2023-11-15: qty 250

## 2023-11-15 MED ORDER — HYDROCHLOROTHIAZIDE 12.5 MG PO TABS
6.2500 mg | ORAL_TABLET | Freq: Every day | ORAL | Status: DC
  Administered 2023-11-15: 6.25 mg via ORAL
  Filled 2023-11-15: qty 1

## 2023-11-15 MED ORDER — SODIUM CHLORIDE 0.9 % IV SOLN: 250.0000 mL | INTRAVENOUS | Status: DC | PRN

## 2023-11-15 MED ORDER — HEPARIN (PORCINE) 25000 UT/250ML-% IV SOLN
1100.0000 [IU]/h | INTRAVENOUS | Status: DC
  Administered 2023-11-15: 1100 [IU]/h via INTRAVENOUS
  Filled 2023-11-15: qty 250

## 2023-11-15 MED ORDER — BISOPROLOL FUMARATE 5 MG PO TABS: 10.0000 mg | ORAL_TABLET | Freq: Every day | ORAL | Status: AC

## 2023-11-15 MED ORDER — CHLORHEXIDINE GLUCONATE CLOTH 2 % EX PADS
6.0000 | MEDICATED_PAD | Freq: Once | CUTANEOUS | Status: AC
  Administered 2023-11-15: 6 via TOPICAL

## 2023-11-15 MED ORDER — TEMAZEPAM 15 MG PO CAPS: 15.0000 mg | ORAL_CAPSULE | Freq: Once | ORAL | Status: DC | PRN

## 2023-11-15 MED ORDER — HYDRALAZINE HCL 20 MG/ML IJ SOLN
10.0000 mg | INTRAMUSCULAR | Status: AC | PRN
Start: 2023-11-15 — End: 2023-11-15

## 2023-11-15 MED ORDER — CEFAZOLIN SODIUM-DEXTROSE 2-4 GM/100ML-% IV SOLN
2.0000 g | INTRAVENOUS | Status: DC
  Filled 2023-11-15: qty 100

## 2023-11-15 MED ORDER — MIDAZOLAM HCL 2 MG/2ML IJ SOLN
INTRAMUSCULAR | Status: AC
  Filled 2023-11-15: qty 2

## 2023-11-15 MED ORDER — METOPROLOL TARTRATE 12.5 MG HALF TABLET
12.5000 mg | ORAL_TABLET | Freq: Once | ORAL | Status: AC
  Administered 2023-11-16: 12.5 mg via ORAL
  Filled 2023-11-15: qty 1

## 2023-11-15 MED ORDER — LIDOCAINE HCL (PF) 1 % IJ SOLN
INTRAMUSCULAR | Status: AC
  Filled 2023-11-15: qty 30

## 2023-11-15 MED ORDER — SODIUM CHLORIDE 0.9 % IV SOLN
250.0000 mL | INTRAVENOUS | Status: DC | PRN
Start: 2023-11-15 — End: 2023-11-16

## 2023-11-15 MED ORDER — IOHEXOL 350 MG/ML SOLN
INTRAVENOUS | Status: DC | PRN
  Administered 2023-11-15: 85 mL

## 2023-11-15 MED ORDER — NITROGLYCERIN IN D5W 200-5 MCG/ML-% IV SOLN
2.0000 ug/min | INTRAVENOUS | Status: DC
  Filled 2023-11-15: qty 250

## 2023-11-15 MED ORDER — VANCOMYCIN HCL 1500 MG/300ML IV SOLN
1500.0000 mg | INTRAVENOUS | Status: DC
  Filled 2023-11-15: qty 300

## 2023-11-15 MED ORDER — MIDAZOLAM HCL 2 MG/2ML IJ SOLN
INTRAMUSCULAR | Status: DC | PRN
  Administered 2023-11-15: 2 mg via INTRAVENOUS

## 2023-11-15 MED ORDER — ACETAMINOPHEN 325 MG PO TABS
650.0000 mg | ORAL_TABLET | ORAL | Status: DC | PRN
Start: 2023-11-15 — End: 2023-11-16

## 2023-11-15 MED ORDER — ROSUVASTATIN CALCIUM 20 MG PO TABS
20.0000 mg | ORAL_TABLET | Freq: Every day | ORAL | Status: DC
  Administered 2023-11-17 – 2023-11-21 (×5): 20 mg via ORAL
  Filled 2023-11-15 (×5): qty 1

## 2023-11-15 MED ORDER — FREE WATER: 500.0000 mL | Freq: Once | Status: DC

## 2023-11-15 MED ORDER — LIDOCAINE HCL (PF) 1 % IJ SOLN
INTRAMUSCULAR | Status: DC | PRN
  Administered 2023-11-15: 2 mL

## 2023-11-15 MED ORDER — HEPARIN (PORCINE) IN NACL 1000-0.9 UT/500ML-% IV SOLN
INTRAVENOUS | Status: DC | PRN
  Administered 2023-11-15: 1000 mL

## 2023-11-15 MED ORDER — POTASSIUM CHLORIDE 2 MEQ/ML IV SOLN
80.0000 meq | INTRAVENOUS | Status: DC
  Filled 2023-11-15: qty 40

## 2023-11-15 MED ORDER — PLASMA-LYTE A IV SOLN
INTRAVENOUS | Status: DC
  Filled 2023-11-15: qty 2.5

## 2023-11-15 MED ORDER — MANNITOL 20 % IV SOLN
INTRAVENOUS | Status: DC
  Filled 2023-11-15: qty 13

## 2023-11-15 MED ORDER — PHENYLEPHRINE HCL-NACL 20-0.9 MG/250ML-% IV SOLN
30.0000 ug/min | INTRAVENOUS | Status: AC
  Administered 2023-11-16: 25 ug/min via INTRAVENOUS
  Filled 2023-11-15: qty 250

## 2023-11-15 MED ORDER — TRANEXAMIC ACID 1000 MG/10ML IV SOLN
1.5000 mg/kg/h | INTRAVENOUS | Status: AC
  Administered 2023-11-16: 1.5 mg/kg/h via INTRAVENOUS
  Filled 2023-11-15: qty 25

## 2023-11-15 MED ORDER — HEPARIN 30,000 UNITS/1000 ML (OHS) CELLSAVER SOLUTION
Status: DC
  Filled 2023-11-15: qty 1000

## 2023-11-15 SURGICAL SUPPLY — 8 items
CATH INFINITI 5 FR AR1 MOD (CATHETERS) IMPLANT
CATH INFINITI 5FR AL1 (CATHETERS) IMPLANT
CATH INFINITI AMBI 5FR TG (CATHETERS) IMPLANT
DEVICE RAD COMP TR BAND LRG (VASCULAR PRODUCTS) IMPLANT
GLIDESHEATH SLEND SS 6F .021 (SHEATH) IMPLANT
GUIDEWIRE INQWIRE 1.5J.035X260 (WIRE) IMPLANT
PACK CARDIAC CATHETERIZATION (CUSTOM PROCEDURE TRAY) ×1 IMPLANT
SET ATX-X65L (MISCELLANEOUS) IMPLANT

## 2023-11-15 NOTE — Progress Notes (Signed)
  Echocardiogram 2D Echocardiogram has been performed.  Anthony Boone 11/15/2023, 5:03 PM

## 2023-11-15 NOTE — Progress Notes (Signed)
 Pre-CABG testing has been completed. Preliminary results can be found in CV Proc through chart review.   11/15/23 3:32 PM Cathlyn Collet RVT

## 2023-11-15 NOTE — Interval H&P Note (Signed)
 History and Physical Interval Note:  11/15/2023 10:17 AM  Anthony Boone  has presented today for surgery, with the diagnosis of CAD.  The various methods of treatment have been discussed with the patient and family. After consideration of risks, benefits and other options for treatment, the patient has consented to  Procedure(s): LEFT HEART CATH AND CORONARY ANGIOGRAPHY (N/A) as a surgical intervention.  Patient presenting with high risk features of exertion related syncope/near syncope, exertional chest tightness in the form of burning sensation in the chest and markedly abnormal coronary CTA revealing multivessel disease more significant in mid CX.  Patient is presently on appropriate medical therapy.  His symptoms will be considered class III.  The patient's history has been reviewed, patient examined, no change in status, stable for surgery.  I have reviewed the patient's chart and labs.  Questions were answered to the patient's satisfaction.     Gordy Bergamo

## 2023-11-15 NOTE — Consult Note (Addendum)
 [address]       [place] [address]             [id number]        [facility] Medical Record #[id number] Date of Birth: [birth year]  Referring: No ref. provider found Primary Care: Almarie Waddell NOVAK, NP Primary Cardiologist:Ganji   Chief Complaint: Unstable angina pectoris  History of Present Illness:    We are asked to see this 68 year old male in cardiothoracic surgical consultation for consideration of coronary artery surgical revascularization.  The patient was recently evaluated by cardiology following a recent trauma with pneumothorax at which time he was found to have an incidental finding of coronary artery calcifications on a CT scan.  On evaluation he was noted to been having symptoms of exertional anginal discomfort usually when walking on the treadmill..  Additionally the trauma was reportedly related to an exertional syncopal event but he relates it more to a slip and fall.  Cardiology evaluation did reveal an elevated LP(a) of 110 and subsequently a cardiac CTA was performed.  He was found to have findings consistent with multivessel coronary artery disease and coronary calcium  score was significantly elevated at 1319.  FFR study showed reduced FFR in the LAD and mid right coronary artery and subsequently it was felt that he should undergo cardiac catheterization.  This was performed on today's date and he is found to have significant left main as well as significant three-vessel coronary artery disease.  LVEDP was noted to be 23 mmHg.  Full report is as described below.  An echocardiogram has been scheduled but results are currently pending.  Significant cardiac risk factors include hyperlipidemia, erectile dysfunction, obesity hypertension and tobacco abuse.    Current Activity/ Functional Status: Patient was independent with mobility/ambulation, transfers, ADL's, IADL's.   Zubrod Score: At the time of surgery this patient's most  appropriate activity status/level should be described as: []     0    Normal activity, no symptoms [x]     1    Restricted in physical strenuous activity but ambulatory, able to do out light work []     2    Ambulatory and capable of self care, unable to do work activities, up and about                 more than 50%  Of the time                            []     3    Only limited self care, in bed greater than 50% of waking hours []     4    Completely disabled, no self care, confined to bed or chair []     5    Moribund  Past Medical History:  Diagnosis Date   Coronary artery calcification seen on CAT scan 10/09/2023   Erectile dysfunction 01/12/2018   Former smoker 01/15/2018   Hyperlipidemia    Hypertension    Medication management 07/29/2013   Obesity (BMI 30.0-34.9) 01/12/2018   Other abnormal glucose 07/29/2013   Vitamin D  deficiency     Past Surgical History:  Procedure Laterality Date   APPENDECTOMY  1974   VASECTOMY  2005   Dr Donelle    Social History   Tobacco Use  Smoking Status Former   Current packs/day: 0.00   Average packs/day: 1.5 packs/day for 29.0 years (43.5 ttl pk-yrs)  Types: Cigarettes   Start date: 66   Quit date: 1994   Years since quitting: 31.6  Smokeless Tobacco Never    Social History   Substance and Sexual Activity  Alcohol  Use Yes     No Known Allergies  Current Facility-Administered Medications  Medication Dose Route Frequency Provider Last Rate Last Admin   0.9 %  sodium chloride  infusion  250 mL Intravenous PRN Monetta Redell PARAS, MD       0.9 %  sodium chloride  infusion  250 mL Intravenous PRN Ganji, Jay, MD       acetaminophen  (TYLENOL ) tablet 650 mg  650 mg Oral Q4H PRN Ladona Heinz, MD       aspirin  chewable tablet 81 mg  81 mg Oral Pre-Cath Munley, Redell PARAS, MD       bisoprolol -hydrochlorothiazide  (ZIAC ) 10-6.25 MG per tablet 1 tablet  1 tablet Oral Daily Ganji, Jay, MD       fentaNYL  (SUBLIMAZE ) injection    PRN Ganji, Jay, MD    25 mcg at 11/15/23 1021   free water  500 mL  500 mL Oral Once Munley, Brian J, MD       free water  500 mL  500 mL Oral Once Ganji, Jay, MD       Heparin  (Porcine) in NaCl 1000-0.9 UT/500ML-% SOLN    PRN Ladona Heinz, MD   1,000 mL at 11/15/23 1038   heparin  sodium (porcine) injection    PRN Ganji, Jay, MD   5,000 Units at 11/15/23 1040   hydrALAZINE  (APRESOLINE ) injection 10 mg  10 mg Intravenous Q4H PRN Ladona Heinz, MD       iohexol  (OMNIPAQUE ) 350 MG/ML injection    PRN Ladona Heinz, MD   85 mL at 11/15/23 1058   lidocaine  (PF) (XYLOCAINE ) 1 % injection    PRN Ladona Heinz, MD   2 mL at 11/15/23 1037   [START ON 11/16/2023] lisinopril  (ZESTRIL ) tablet 5 mg  5 mg Oral Daily Ladona Heinz, MD       midazolam  (VERSED ) injection    PRN Ganji, Jay, MD   2 mg at 11/15/23 1022   ondansetron  (ZOFRAN ) injection 4 mg  4 mg Intravenous Q6H PRN Ladona Heinz, MD       Radial Cocktail/Verapamil  only    PRN Ganji, Jay, MD   10 mL at 11/15/23 1037   sodium chloride  flush (NS) 0.9 % injection 3 mL  3 mL Intravenous Q12H Monetta Redell PARAS, MD       sodium chloride  flush (NS) 0.9 % injection 3 mL  3 mL Intravenous PRN Monetta Redell PARAS, MD        Medications Prior to Admission  Medication Sig Dispense Refill Last Dose/Taking   aspirin  EC 81 MG tablet Takes 1 tablet Daily 1 tablet 0 11/15/2023 at  8:00 AM   bisoprolol -hydrochlorothiazide  (ZIAC ) 10-6.25 MG tablet Take  1 tablet  Daily   for BP 90 tablet 3 Past Week   lisinopril  (ZESTRIL ) 40 MG tablet Take  1 tablet   Daily   for BP 90 tablet 3 11/15/2023 Morning   rosuvastatin  (CRESTOR ) 20 MG tablet Take 1 tablet (20 mg total) by mouth daily. 90 tablet 1 11/15/2023 Morning   sildenafil  (VIAGRA ) 50 MG tablet Take 1 tablet (50 mg total) by mouth as needed for erectile dysfunction. Take 1 hour before sexual intercourse. 30 tablet 1 Past Week    Family History  Problem Relation Age of Onset   Heart disease Father  Hypertension Brother      Review of Systems:   Review of  Systems  Cardiovascular:  Positive for chest pain and palpitations.  Gastrointestinal:  Positive for heartburn.       H/o anal fissures  Genitourinary:  Positive for frequency.       Nocturia  Musculoskeletal:  Positive for back pain.  Psychiatric/Behavioral:  The patient has insomnia.        Physical Exam: BP (!) 158/87   Pulse 78   Temp 98.3 F (36.8 C) (Oral)   Resp 10   Ht 5' 8 (1.727 m)   Wt 97.5 kg   SpO2 97%   BMI 32.69 kg/m    General appearance: alert, cooperative, and no distress Head: Normocephalic, without obvious abnormality, atraumatic Neck: no adenopathy, no carotid bruit, no JVD, supple, symmetrical, trachea midline, and thyroid  not enlarged, symmetric, no tenderness/mass/nodules Lymph nodes: Cervical, supraclavicular, and axillary nodes normal. Resp: clear to auscultation bilaterally Back: symmetric, no curvature. ROM normal. No CVA tenderness. Cardio: regular rate and rhythm, S1, S2 normal, no murmur, click, rub or gallop GI: benign Extremities: extremities normal, atraumatic, no cyanosis or edema and + pedal pulses Neurologic: Grossly normal  Diagnostic Studies & Laboratory data:    CARDIAC CATHETERIZATION Result Date: 11/15/2023 Images from the original result were not included. Cardiac Catheterization 11/15/23: Hemodynamic data: LV: 161/3, EDP 23 mmHg.  Ao 156/90, mean 121 mmHg.  No pressure gradient across the aortic valve. Angiographic data: LM: Distal left main has a calcific and complex irregular 80% stenosis. LAD: LAD is calcified in the proximal segment.  There is a eccentric 60% stenosis in the proximal LAD.  Large D2.  Mild disease in the mid and distal LAD. LCx: Very large caliber vessel giving origin to a large OM1.  Proximal Cx has a 90% calcific stenosis, large OM1 has a proximal 90% stenosis followed by distal 60% stenosis. RI: Moderate caliber vessel, supplies large area.  Ostium has ectasia. RCA: Has anterior origin.  There is tandem 40%  proximal and a mid 60% stenosis.  PDA PL branches are very small and diffusely diseased and PL has a focal ostial 80% stenosis. 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  Hence discharge made to admit the patient for inpatient artery bypass grafting.  Patient will be started on IV heparin  as well.   LONG TERM MONITOR (3-14 DAYS) Result Date: 11/04/2023 Patch Wear Time:  13 days and 15 hours (2025-08-01T19:09:38-0400 to 2025-08-15T10:22:16-0400) HR 35 - 132, average 71 bpm. 1 nonsustained SVT (longest 8 beats). No atrial fibrillation detected. Rare supraventricular ectopy. Rare ventricular ectopy. No sustained arrhythmias. Symptom trigger episodes correspond to sinus rhythm. [facility] Cardiac Electrophysiology  CT CORONARY MORPH W/CTA COR W/SCORE W/CA W/CM &/OR WO/CM Addendum Date: 11/03/2023 ADDENDUM REPORT: 11/03/2023 21:11 EXAM: OVER-READ INTERPRETATION  CT CHEST The following report is an over-read performed by radiologist Dr. Suzen Dials of [facility] on 11/03/2023. This over-read does not include interpretation of cardiac or coronary anatomy or pathology. The coronary calcium  score/coronary CTA interpretation by the cardiologist is attached. COMPARISON:  None. FINDINGS: Cardiovascular: There are no significant extracardiac vascular findings. Mediastinum/Nodes: There are no enlarged lymph nodes within the visualized mediastinum. Lungs/Pleura: There is no pleural effusion. Mild right middle lobe linear scarring and/or atelectasis is seen. Mild atelectatic changes are also noted within the posterior aspect of the left lung base. Upper abdomen:  No significant findings in the visualized upper abdomen. Musculoskeletal/Chest wall: No chest wall mass or suspicious osseous findings within the visualized chest. IMPRESSION: No significant extracardiac  findings within the visualized chest. Electronically Signed   By: Suzen Dials M.D.   On: 11/03/2023 21:11   Result Date: 11/03/2023 CLINICAL DATA:  CP EXAM: Cardiac/Coronary  CTA TECHNIQUE: The patient was scanned on a GE Apex scanner. FINDINGS: A 120 kV prospective scan was triggered in the descending thoracic aorta at 111 HU's. Axial non-contrast 3 mm slices were carried out through the heart. The data set was analyzed on a dedicated work station and scored using the Agatson method. Gantry rotation speed was 250 msecs and collimation was .6 mm. No beta blockade and 0.8 mg of sl NTG was given. The 3D data set was reconstructed at 75% of the R-R cycle. Diastolic phases were analyzed on a dedicated work station using MPR, MIP and VRT modes. The patient received 80 cc of contrast. Aorta: Normal size.  No calcifications.  No dissection. Aortic Valve:  Trileaflet.  No calcifications. Coronary Arteries:  Normal coronary origin.  Right dominance. RCA is a large dominant artery that gives rise to PDA and PLA. This artery is diffusely diseased in its proximal and mid portion with numerous mixed plaques and moderate stenosis of 50-69%. Left main is a large artery that gives rise to LAD and LCX arteries as well as moderate size Intermediate Branch. In the distal portion of LM there is mixed plaque with mild stenosis of 25-49%. LAD is a large vessel that diffusely diseased in its proximal and mid portion with numerous mostly calcified plaques with moderate stenosis of 50-69%. This artery gives rise to small D1 and large D2. D2 has mixed plaque in its proximal portion with moderate stenosis of 50-69%. Intermediate Branch has mixed plaques in its proximal portion with mild stenosis of 25-49%. LCX is a non-dominant artery that gives rise to one large OM1 branch. There is mixed plaque in the proximal portion of this artery with severe stenosis of 70-99%. Large OM1 has mixed plaque in its proximal with moderate stenosis of  50-69%. Other findings: Normal pulmonary vein drainage into the left atrium. Rt middle pulmonary vein noted - normal variant. Normal left atrial appendage without a thrombus. Normal size of the pulmonary artery. IMPRESSION: 1. Coronary calcium  score of 1393. This was 2 percentile for age and sex matched control. 2. Normal coronary origin with right dominance. 3. CAD-RADS 4 Severe stenosis. (70-99%) prox CX. Cardiac catheterization or CT FFR is recommended. Consider symptom-guided anti-ischemic pharmacotherapy as well as risk factor modification per guideline directed care. 4. Plaque analysis: TPV 1478 mm3, 91 st percentile, calcified plaque 407 mm3, non calcified plaque 1071 mm3, Extensive TPV. Electronically Signed: By: Lamar Fitch M.D. On: 11/03/2023 18:20   CT CORONARY FRACTIONAL FLOW RESERVE FLUID ANALYSIS Result Date: 11/03/2023 EXAM: FFRCT ANALYSIS FINDINGS: FFRct analysis was performed on the original cardiac CT angiogram dataset. Diagrammatic representation of the FFRct analysis is provided in a separate PDF document in PACS. This dictation was created using the PDF document and an interactive 3D model of the results. 3D model is not available in the EMR/PACS. Normal FFR range is >0.80. 1. LM FFR: Prox 0.99, distal FFR: 0.88 2. LAD FFR: Prox 0.85, mid 0.67, distal 0.6 3. CX FFR: Prox 0.86, mid 0.51, distal 0.51 4. RCA FFR: Prox 0.98, mid 0.72, distal 0.63 5. IMPRESSION: This study demonstrates HIGH likelihood of hemodynamically significant stenosis of prox CX,  mid LAD, mid RCA. TVD, borderline distal LM. Electronically Signed   By: Lamar Fitch M.D.   On: 11/03/2023 18:24           I have independently reviewed the above radiologic studies and discussed with the patient   Recent Lab Findings: Lab Results  Component Value Date   WBC 5.8 11/10/2023   HGB 15.7 11/10/2023   HCT 46.6 11/10/2023   PLT 137 (L) 11/10/2023   GLUCOSE 96 11/10/2023   CHOL 157 10/09/2023   TRIG 326.0 (H)  10/09/2023   HDL 36.20 (L) 10/09/2023   LDLCALC 55 10/09/2023   ALT 20 10/09/2023   AST 18 10/09/2023   NA 140 11/10/2023   K 4.7 11/10/2023   CL 102 11/10/2023   CREATININE 1.09 11/10/2023   BUN 16 11/10/2023   CO2 23 11/10/2023   TSH 1.48 10/09/2023   HGBA1C 5.2 12/23/2022      Assessment / Plan: Severe left main and three-vessel coronary artery disease. Previous tobacco abuse Hyperlipidemia Hypertension Obesity Vitamin D  deficiency History of recent trauma and July with right rib fractures/pneumothorax  Plan: The patient appears to be a good candidate for CABG.  Surgeon will review the patient and all relevant studies and determine timing.  Echocardiogram is currently pending.    I  spent 40 minutes counseling the patient face to face.   Wayne E Gold, PA-C  11/15/2023 12:01 PM  Mr. Morales is a pleasant 68 year old man who was admitted today after outpatient LHC demonstrated high grade left main disease.  This all came about after a fall back in July. He recalls being in a water  balloon fight with his grandkids, when his legs gave out (he has bad knees at baseline) and he slipped and fell forward.  He does not think he blacked out/loss consciousness and recalls the fall very clearly.  He ended up with broken right ribs and a small right pneumothorax treated medically.  His imaging, however, demonstrated significant coronary artery disease which prompted cardiology evaluation and ultimately LHC today.  Overall, he reports feeling well.  He was mulching and working in the yard yesterday.  He is very active and completely independent in his ADLs.  He quit smoking 30 years ago and has a 40 pack year history.  Aside from the fall in July, he denies chest trauma and has no other history of chest surgeries.  STS risk calculation: 0.633% / 3.9%    Given his young age, severe left main disease, and good bypass anatomy, Mr. Gorr is a good candidate for CABG.  I discussed the  general nature of the procedure, including the need for general anesthesia, the incisions to be used, the use of cardiopulmonary bypass, and the use of temporary pacemaker wires and drainage tubes postoperatively with the patient and his family.  We discussed the expected [facility] stay, overall recovery and short and long term outcomes. I informed him of the indications, risks, benefits and alternatives.   He understands the risks include, but are not limited to death, stroke, MI, DVT/PE, bleeding, possible need for transfusion, infections, cardiac arrhythmias, as well as other organ system dysfunction including respiratory (eg: prolonged ventilation), renal, or GI complications.   Patient is in agreement to proceed with surgery.  Plan: Proceed with CABG tomorrow  Con [name], MD Cardiothoracic Surgery Pager: [phone]

## 2023-11-15 NOTE — Plan of Care (Signed)
  Problem: Education: Goal: Understanding of CV disease, CV risk reduction, and recovery process will improve Outcome: Progressing   Problem: Activity: Goal: Ability to return to baseline activity level will improve Outcome: Progressing   Problem: Health Behavior/Discharge Planning: Goal: Ability to safely manage health-related needs after discharge will improve Outcome: Progressing   Problem: Education: Goal: Knowledge of General Education information will improve Description: Including pain rating scale, medication(s)/side effects and non-pharmacologic comfort measures Outcome: Progressing   Problem: Health Behavior/Discharge Planning: Goal: Ability to manage health-related needs will improve Outcome: Progressing   Problem: Clinical Measurements: Goal: Will remain free from infection Outcome: Progressing   Problem: Nutrition: Goal: Adequate nutrition will be maintained Outcome: Progressing   Problem: Coping: Goal: Level of anxiety will decrease Outcome: Progressing   Problem: Elimination: Goal: Will not experience complications related to bowel motility Outcome: Progressing Goal: Will not experience complications related to urinary retention Outcome: Progressing   Problem: Safety: Goal: Ability to remain free from injury will improve Outcome: Progressing   Problem: Skin Integrity: Goal: Risk for impaired skin integrity will decrease Outcome: Progressing   Problem: Cardiovascular: Goal: Ability to achieve and maintain adequate cardiovascular perfusion will improve Outcome: Not Progressing   Problem: Clinical Measurements: Goal: Ability to maintain clinical measurements within normal limits will improve Outcome: Not Progressing Goal: Diagnostic test results will improve Outcome: Not Progressing Goal: Respiratory complications will improve Outcome: Not Progressing Goal: Cardiovascular complication will be avoided Outcome: Not Progressing   Problem:  Activity: Goal: Risk for activity intolerance will decrease Outcome: Not Progressing   Problem: Pain Managment: Goal: General experience of comfort will improve and/or be controlled Outcome: Not Progressing

## 2023-11-15 NOTE — Anesthesia Preprocedure Evaluation (Signed)
 Anesthesia Evaluation  Patient identified by MRN, date of birth, ID band Patient awake    Reviewed: Allergy & Precautions, NPO status , Patient's Chart, lab work & pertinent test results  Airway Mallampati: II  TM Distance: >3 FB Neck ROM: Full    Dental no notable dental hx.    Pulmonary former smoker   Pulmonary exam normal breath sounds clear to auscultation       Cardiovascular hypertension, Pt. on home beta blockers and Pt. on medications + angina  + CAD  Normal cardiovascular exam Rhythm:Regular Rate:Normal  Echo 9.3.2025  1. Left ventricular ejection fraction, by estimation, is 50 to 55%. The  left ventricle has low normal function. Left ventricular endocardial  border not optimally defined to evaluate regional wall motion. There is  mild left ventricular hypertrophy. Left  ventricular diastolic parameters were normal.   2. Right ventricular systolic function is normal. The right ventricular  size is normal. Tricuspid regurgitation signal is inadequate for assessing  PA pressure.   3. The mitral valve is normal in structure. Trivial mitral valve  regurgitation. No evidence of mitral stenosis.   4. The aortic valve was not well visualized. Aortic valve regurgitation  is not visualized. No aortic stenosis is present.    Cath 9.3.2025 Angiographic data: LM: Distal left main has a calcific and complex irregular 80% stenosis. LAD: LAD is calcified in the proximal segment.  There is a eccentric 60% stenosis in the proximal LAD.  Large D2.  Mild disease in the mid and distal LAD. LCx: Very large caliber vessel giving origin to a large OM1.  Proximal Cx has a 90% calcific stenosis, large OM1 has a proximal 90% stenosis followed by distal 60% stenosis. RI: Moderate caliber vessel, supplies large area.  Ostium has ectasia. RCA: Has anterior origin.  There is tandem 40% proximal and a mid 60% stenosis.  PDA PL branches are very  small and diffusely diseased and PL has a focal ostial 80% stenosis.    Neuro/Psych negative neurological ROS     GI/Hepatic negative GI ROS, Neg liver ROS,,,  Endo/Other  negative endocrine ROS    Renal/GU negative Renal ROS     Musculoskeletal negative musculoskeletal ROS (+)    Abdominal  (+) + obese  Peds  Hematology negative hematology ROS (+)   Anesthesia Other Findings   Reproductive/Obstetrics                              Anesthesia Physical Anesthesia Plan  ASA: 4  Anesthesia Plan: General   Post-op Pain Management:    Induction:   PONV Risk Score and Plan: Midazolam   Airway Management Planned: Oral ETT  Additional Equipment: Arterial line, CVP, PA Cath, TEE and Ultrasound Guidance Line Placement  Intra-op Plan: Utilization Of Total Body Hypothermia per surgeon request  Post-operative Plan: Post-operative intubation/ventilation  Informed Consent: I have reviewed the patients History and Physical, chart, labs and discussed the procedure including the risks, benefits and alternatives for the proposed anesthesia with the patient or authorized representative who has indicated his/her understanding and acceptance.     Dental advisory given  Plan Discussed with: CRNA  Anesthesia Plan Comments:          Anesthesia Quick Evaluation

## 2023-11-15 NOTE — Progress Notes (Signed)
 PHARMACY - ANTICOAGULATION CONSULT NOTE  Pharmacy Consult for Heparin  Indication: chest pain/ACS  No Known Allergies  Patient Measurements: Height: 5' 8 (172.7 cm) Weight: 97.5 kg (215 lb) IBW/kg (Calculated) : 68.4 HEPARIN  DW (KG): 89.1  Vital Signs: Temp: 97.7 F (36.5 C) (09/03 1700) Temp Source: Oral (09/03 1700) BP: 155/81 (09/03 1700) Pulse Rate: 64 (09/03 1700)  Labs: No results for input(s): HGB, HCT, PLT, APTT, LABPROT, INR, HEPARINUNFRC, HEPRLOWMOCWT, CREATININE, CKTOTAL, CKMB, TROPONINIHS in the last 72 hours.  Estimated Creatinine Clearance: 74.4 mL/min (by C-G formula based on SCr of 1.09 mg/dL).   Medical History: Past Medical History:  Diagnosis Date   Coronary artery calcification seen on CAT scan 10/09/2023   Erectile dysfunction 01/12/2018   Former smoker 01/15/2018   Hyperlipidemia    Hypertension    Medication management 07/29/2013   Obesity (BMI 30.0-34.9) 01/12/2018   Other abnormal glucose 07/29/2013   Vitamin D  deficiency     Assessment: 67yoM s/p cardiac catheterization 9/3, now awaiting CT surgery. Pharmacy consulted to start heparin  infusion 8 hours post sheath removal. RN confirmed the TR band is off and no overt s/sx of bleeding at this time.   Goal of Therapy:  Heparin  level 0.3-0.7 units/ml Monitor platelets by anticoagulation protocol: Yes   Plan:  Start heparin  infusion at 1100 units/hr (No bolus with recent procedure) Check anti-Xa level in 8 hours and daily while on heparin  Continue to monitor H&H and platelets  Koren Or, PharmD Clinical Pharmacist 11/15/2023 6:50 PM Please check AMION for all [facility] Pharmacy numbers

## 2023-11-16 ENCOUNTER — Inpatient Hospital Stay (HOSPITAL_COMMUNITY): Payer: Self-pay | Admitting: Anesthesiology

## 2023-11-16 ENCOUNTER — Other Ambulatory Visit: Payer: Self-pay

## 2023-11-16 ENCOUNTER — Inpatient Hospital Stay (HOSPITAL_COMMUNITY)

## 2023-11-16 ENCOUNTER — Encounter (HOSPITAL_COMMUNITY): Payer: Self-pay | Admitting: Cardiology

## 2023-11-16 ENCOUNTER — Inpatient Hospital Stay (HOSPITAL_COMMUNITY): Admission: RE | Disposition: A | Discharge status: Home Health | Payer: Self-pay | Source: Home / Self Care

## 2023-11-16 DIAGNOSIS — Z951 Presence of aortocoronary bypass graft: Secondary | ICD-10-CM

## 2023-11-16 DIAGNOSIS — Z87891 Personal history of nicotine dependence: Secondary | ICD-10-CM

## 2023-11-16 DIAGNOSIS — E785 Hyperlipidemia, unspecified: Secondary | ICD-10-CM

## 2023-11-16 DIAGNOSIS — I25118 Atherosclerotic heart disease of native coronary artery with other forms of angina pectoris: Secondary | ICD-10-CM | POA: Diagnosis not present

## 2023-11-16 DIAGNOSIS — I2510 Atherosclerotic heart disease of native coronary artery without angina pectoris: Secondary | ICD-10-CM | POA: Diagnosis present

## 2023-11-16 DIAGNOSIS — I10 Essential (primary) hypertension: Secondary | ICD-10-CM | POA: Diagnosis not present

## 2023-11-16 DIAGNOSIS — D696 Thrombocytopenia, unspecified: Secondary | ICD-10-CM | POA: Diagnosis not present

## 2023-11-16 HISTORY — DX: Atherosclerotic heart disease of native coronary artery without angina pectoris: I25.10

## 2023-11-16 HISTORY — PX: CORONARY ARTERY BYPASS GRAFT: SHX141

## 2023-11-16 HISTORY — PX: INTRAOPERATIVE TRANSESOPHAGEAL ECHOCARDIOGRAM: SHX5062

## 2023-11-16 LAB — POCT I-STAT, CHEM 8
BUN: 12 mg/dL (ref 8–23)
BUN: 11 mg/dL (ref 8–23)
BUN: 13 mg/dL (ref 8–23)
BUN: 14 mg/dL (ref 8–23)
BUN: 14 mg/dL (ref 8–23)
BUN: 14 mg/dL (ref 8–23)
Calcium, Ion: 1.23 mmol/L (ref 1.15–1.40)
Calcium, Ion: 1.08 mmol/L — ABNORMAL LOW (ref 1.15–1.40)
Calcium, Ion: 1.04 mmol/L — ABNORMAL LOW (ref 1.15–1.40)
Calcium, Ion: 1.06 mmol/L — ABNORMAL LOW (ref 1.15–1.40)
Calcium, Ion: 1.14 mmol/L — ABNORMAL LOW (ref 1.15–1.40)
Calcium, Ion: 1.09 mmol/L — ABNORMAL LOW (ref 1.15–1.40)
Chloride: 102 mmol/L (ref 98–111)
Chloride: 107 mmol/L (ref 98–111)
Chloride: 99 mmol/L (ref 98–111)
Chloride: 100 mmol/L (ref 98–111)
Chloride: 99 mmol/L (ref 98–111)
Chloride: 102 mmol/L (ref 98–111)
Creatinine, Ser: 1 mg/dL (ref 0.61–1.24)
Creatinine, Ser: 0.8 mg/dL (ref 0.61–1.24)
Creatinine, Ser: 1 mg/dL (ref 0.61–1.24)
Creatinine, Ser: 0.9 mg/dL (ref 0.61–1.24)
Creatinine, Ser: 1 mg/dL (ref 0.61–1.24)
Creatinine, Ser: 1 mg/dL (ref 0.61–1.24)
Glucose, Bld: 115 mg/dL — ABNORMAL HIGH (ref 70–99)
Glucose, Bld: 100 mg/dL — ABNORMAL HIGH (ref 70–99)
Glucose, Bld: 127 mg/dL — ABNORMAL HIGH (ref 70–99)
Glucose, Bld: 140 mg/dL — ABNORMAL HIGH (ref 70–99)
Glucose, Bld: 141 mg/dL — ABNORMAL HIGH (ref 70–99)
Glucose, Bld: 160 mg/dL — ABNORMAL HIGH (ref 70–99)
HCT: 42 % (ref 39.0–52.0)
HCT: 33 % — ABNORMAL LOW (ref 39.0–52.0)
HCT: 29 % — ABNORMAL LOW (ref 39.0–52.0)
HCT: 26 % — ABNORMAL LOW (ref 39.0–52.0)
HCT: 28 % — ABNORMAL LOW (ref 39.0–52.0)
HCT: 28 % — ABNORMAL LOW (ref 39.0–52.0)
Hemoglobin: 14.3 g/dL (ref 13.0–17.0)
Hemoglobin: 11.2 g/dL — ABNORMAL LOW (ref 13.0–17.0)
Hemoglobin: 9.9 g/dL — ABNORMAL LOW (ref 13.0–17.0)
Hemoglobin: 8.8 g/dL — ABNORMAL LOW (ref 13.0–17.0)
Hemoglobin: 9.5 g/dL — ABNORMAL LOW (ref 13.0–17.0)
Hemoglobin: 9.5 g/dL — ABNORMAL LOW (ref 13.0–17.0)
Potassium: 4.4 mmol/L (ref 3.5–5.1)
Potassium: 4.1 mmol/L (ref 3.5–5.1)
Potassium: 6.4 mmol/L (ref 3.5–5.1)
Potassium: 6.1 mmol/L — ABNORMAL HIGH (ref 3.5–5.1)
Potassium: 6.4 mmol/L (ref 3.5–5.1)
Potassium: 5.3 mmol/L — ABNORMAL HIGH (ref 3.5–5.1)
Sodium: 138 mmol/L (ref 135–145)
Sodium: 139 mmol/L (ref 135–145)
Sodium: 131 mmol/L — ABNORMAL LOW (ref 135–145)
Sodium: 132 mmol/L — ABNORMAL LOW (ref 135–145)
Sodium: 135 mmol/L (ref 135–145)
Sodium: 136 mmol/L (ref 135–145)
TCO2: 26 mmol/L (ref 22–32)
TCO2: 23 mmol/L (ref 22–32)
TCO2: 25 mmol/L (ref 22–32)
TCO2: 26 mmol/L (ref 22–32)
TCO2: 27 mmol/L (ref 22–32)
TCO2: 25 mmol/L (ref 22–32)

## 2023-11-16 LAB — PREPARE RBC (CROSSMATCH)

## 2023-11-16 LAB — CBC
HCT: 44.3 % (ref 39.0–52.0)
HCT: 34.6 % — ABNORMAL LOW (ref 39.0–52.0)
HCT: 32.6 % — ABNORMAL LOW (ref 39.0–52.0)
Hemoglobin: 15.7 g/dL (ref 13.0–17.0)
Hemoglobin: 12 g/dL — ABNORMAL LOW (ref 13.0–17.0)
Hemoglobin: 11.3 g/dL — ABNORMAL LOW (ref 13.0–17.0)
MCH: 30.3 pg (ref 26.0–34.0)
MCH: 30.1 pg (ref 26.0–34.0)
MCH: 30.3 pg (ref 26.0–34.0)
MCHC: 35.4 g/dL (ref 30.0–36.0)
MCHC: 34.7 g/dL (ref 30.0–36.0)
MCHC: 34.7 g/dL (ref 30.0–36.0)
MCV: 85.4 fL (ref 80.0–100.0)
MCV: 86.7 fL (ref 80.0–100.0)
MCV: 87.4 fL (ref 80.0–100.0)
Platelets: 123 10*3/uL — ABNORMAL LOW (ref 150–400)
Platelets: 88 K/uL — ABNORMAL LOW (ref 150–400)
Platelets: 111 K/uL — ABNORMAL LOW (ref 150–400)
RBC: 5.19 MIL/uL (ref 4.22–5.81)
RBC: 3.99 MIL/uL — ABNORMAL LOW (ref 4.22–5.81)
RBC: 3.73 MIL/uL — ABNORMAL LOW (ref 4.22–5.81)
RDW: 12.9 % (ref 11.5–15.5)
RDW: 13 % (ref 11.5–15.5)
RDW: 13.2 % (ref 11.5–15.5)
WBC: 5.8 10*3/uL (ref 4.0–10.5)
WBC: 7.7 K/uL (ref 4.0–10.5)
WBC: 7.8 K/uL (ref 4.0–10.5)
nRBC: 0 % (ref 0.0–0.2)
nRBC: 0 % (ref 0.0–0.2)
nRBC: 0 % (ref 0.0–0.2)

## 2023-11-16 LAB — POCT I-STAT 7, (LYTES, BLD GAS, ICA,H+H)
Acid-Base Excess: 0 mmol/L (ref 0.0–2.0)
Acid-Base Excess: 0 mmol/L (ref 0.0–2.0)
Acid-base deficit: 1 mmol/L (ref 0.0–2.0)
Acid-base deficit: 1 mmol/L (ref 0.0–2.0)
Acid-base deficit: 2 mmol/L (ref 0.0–2.0)
Bicarbonate: 24.7 mmol/L (ref 20.0–28.0)
Bicarbonate: 25.2 mmol/L (ref 20.0–28.0)
Bicarbonate: 24.1 mmol/L (ref 20.0–28.0)
Bicarbonate: 25.8 mmol/L (ref 20.0–28.0)
Bicarbonate: 25.3 mmol/L (ref 20.0–28.0)
Calcium, Ion: 0.98 mmol/L — ABNORMAL LOW (ref 1.15–1.40)
Calcium, Ion: 1.09 mmol/L — ABNORMAL LOW (ref 1.15–1.40)
Calcium, Ion: 1.11 mmol/L — ABNORMAL LOW (ref 1.15–1.40)
Calcium, Ion: 1.1 mmol/L — ABNORMAL LOW (ref 1.15–1.40)
Calcium, Ion: 1.1 mmol/L — ABNORMAL LOW (ref 1.15–1.40)
HCT: 30 % — ABNORMAL LOW (ref 39.0–52.0)
HCT: 26 % — ABNORMAL LOW (ref 39.0–52.0)
HCT: 33 % — ABNORMAL LOW (ref 39.0–52.0)
HCT: 29 % — ABNORMAL LOW (ref 39.0–52.0)
HCT: 29 % — ABNORMAL LOW (ref 39.0–52.0)
Hemoglobin: 10.2 g/dL — ABNORMAL LOW (ref 13.0–17.0)
Hemoglobin: 8.8 g/dL — ABNORMAL LOW (ref 13.0–17.0)
Hemoglobin: 11.2 g/dL — ABNORMAL LOW (ref 13.0–17.0)
Hemoglobin: 9.9 g/dL — ABNORMAL LOW (ref 13.0–17.0)
Hemoglobin: 9.9 g/dL — ABNORMAL LOW (ref 13.0–17.0)
O2 Saturation: 100 %
O2 Saturation: 100 %
O2 Saturation: 97 %
O2 Saturation: 95 %
O2 Saturation: 96 %
Patient temperature: 36.2
Patient temperature: 36.8
Patient temperature: 36.5
Potassium: 5.6 mmol/L — ABNORMAL HIGH (ref 3.5–5.1)
Potassium: 5.3 mmol/L — ABNORMAL HIGH (ref 3.5–5.1)
Potassium: 5.1 mmol/L (ref 3.5–5.1)
Potassium: 5 mmol/L (ref 3.5–5.1)
Potassium: 4.8 mmol/L (ref 3.5–5.1)
Sodium: 136 mmol/L (ref 135–145)
Sodium: 136 mmol/L (ref 135–145)
Sodium: 139 mmol/L (ref 135–145)
Sodium: 140 mmol/L (ref 135–145)
Sodium: 140 mmol/L (ref 135–145)
TCO2: 26 mmol/L (ref 22–32)
TCO2: 27 mmol/L (ref 22–32)
TCO2: 26 mmol/L (ref 22–32)
TCO2: 27 mmol/L (ref 22–32)
TCO2: 27 mmol/L (ref 22–32)
pCO2 arterial: 45.5 mmHg (ref 32–48)
pCO2 arterial: 51.9 mmHg — ABNORMAL HIGH (ref 32–48)
pCO2 arterial: 46.8 mmHg (ref 32–48)
pCO2 arterial: 45.8 mmHg (ref 32–48)
pCO2 arterial: 44.5 mmHg (ref 32–48)
pH, Arterial: 7.342 — ABNORMAL LOW (ref 7.35–7.45)
pH, Arterial: 7.294 — ABNORMAL LOW (ref 7.35–7.45)
pH, Arterial: 7.315 — ABNORMAL LOW (ref 7.35–7.45)
pH, Arterial: 7.359 (ref 7.35–7.45)
pH, Arterial: 7.36 (ref 7.35–7.45)
pO2, Arterial: 379 mmHg — ABNORMAL HIGH (ref 83–108)
pO2, Arterial: 315 mmHg — ABNORMAL HIGH (ref 83–108)
pO2, Arterial: 102 mmHg (ref 83–108)
pO2, Arterial: 77 mmHg — ABNORMAL LOW (ref 83–108)
pO2, Arterial: 85 mmHg (ref 83–108)

## 2023-11-16 LAB — MAGNESIUM: Magnesium: 3.2 mg/dL — ABNORMAL HIGH (ref 1.7–2.4)

## 2023-11-16 LAB — HEMOGLOBIN AND HEMATOCRIT, BLOOD
HCT: 30 % — ABNORMAL LOW (ref 39.0–52.0)
Hemoglobin: 10.3 g/dL — ABNORMAL LOW (ref 13.0–17.0)

## 2023-11-16 LAB — POCT I-STAT EG7
Acid-Base Excess: 0 mmol/L (ref 0.0–2.0)
Bicarbonate: 26.3 mmol/L (ref 20.0–28.0)
Calcium, Ion: 1.03 mmol/L — ABNORMAL LOW (ref 1.15–1.40)
HCT: 30 % — ABNORMAL LOW (ref 39.0–52.0)
Hemoglobin: 10.2 g/dL — ABNORMAL LOW (ref 13.0–17.0)
O2 Saturation: 81 %
Potassium: 5.5 mmol/L — ABNORMAL HIGH (ref 3.5–5.1)
Sodium: 134 mmol/L — ABNORMAL LOW (ref 135–145)
TCO2: 28 mmol/L (ref 22–32)
pCO2, Ven: 50 mmHg (ref 44–60)
pH, Ven: 7.329 (ref 7.25–7.43)
pO2, Ven: 49 mmHg — ABNORMAL HIGH (ref 32–45)

## 2023-11-16 LAB — BASIC METABOLIC PANEL WITH GFR
Anion gap: 11 (ref 5–15)
Anion gap: 10 (ref 5–15)
BUN: 13 mg/dL (ref 8–23)
BUN: 14 mg/dL (ref 8–23)
CO2: 24 mmol/L (ref 22–32)
CO2: 24 mmol/L (ref 22–32)
Calcium: 9.1 mg/dL (ref 8.9–10.3)
Calcium: 7.4 mg/dL — ABNORMAL LOW (ref 8.9–10.3)
Chloride: 103 mmol/L (ref 98–111)
Chloride: 106 mmol/L (ref 98–111)
Creatinine, Ser: 1.01 mg/dL (ref 0.61–1.24)
Creatinine, Ser: 1.15 mg/dL (ref 0.61–1.24)
GFR, Estimated: >60 mL/min
GFR, Estimated: >60 mL/min (ref >60)
Glucose, Bld: 107 mg/dL — ABNORMAL HIGH (ref 70–99)
Glucose, Bld: 146 mg/dL — ABNORMAL HIGH (ref 70–99)
Potassium: 4.4 mmol/L (ref 3.5–5.1)
Potassium: 4.5 mmol/L (ref 3.5–5.1)
Sodium: 138 mmol/L (ref 135–145)
Sodium: 140 mmol/L (ref 135–145)

## 2023-11-16 LAB — PREPARE PLATELET PHERESIS: Unit division: 0

## 2023-11-16 LAB — BPAM PLATELET PHERESIS
Blood Product Expiration Date: 202509052359
Unit Type and Rh: 600

## 2023-11-16 LAB — ECHO INTRAOPERATIVE TEE
Height: 68 in
Weight: 3440 [oz_av]

## 2023-11-16 LAB — GLUCOSE, CAPILLARY
Glucose-Capillary: 113 mg/dL — ABNORMAL HIGH (ref 70–99)
Glucose-Capillary: 126 mg/dL — ABNORMAL HIGH (ref 70–99)
Glucose-Capillary: 128 mg/dL — ABNORMAL HIGH (ref 70–99)
Glucose-Capillary: 140 mg/dL — ABNORMAL HIGH (ref 70–99)
Glucose-Capillary: 146 mg/dL — ABNORMAL HIGH (ref 70–99)
Glucose-Capillary: 153 mg/dL — ABNORMAL HIGH (ref 70–99)
Glucose-Capillary: 157 mg/dL — ABNORMAL HIGH (ref 70–99)
Glucose-Capillary: 64 mg/dL — ABNORMAL LOW (ref 70–99)

## 2023-11-16 LAB — BLOOD GAS, ARTERIAL
Acid-Base Excess: 1.2 mmol/L (ref 0.0–2.0)
Bicarbonate: 25.1 mmol/L (ref 20.0–28.0)
O2 Saturation: 98.2 %
Patient temperature: 36.9
pCO2 arterial: 37 mmHg (ref 32–48)
pH, Arterial: 7.44 (ref 7.35–7.45)
pO2, Arterial: 88 mmHg (ref 83–108)

## 2023-11-16 LAB — ABO/RH: ABO/RH(D): A NEG

## 2023-11-16 LAB — FIBRINOGEN: Fibrinogen: 179 mg/dL — ABNORMAL LOW (ref 210–475)

## 2023-11-16 LAB — HEMOGLOBIN A1C
Hgb A1c MFr Bld: 5.2 % (ref 4.8–5.6)
Mean Plasma Glucose: 102.54 mg/dL

## 2023-11-16 LAB — PLATELET COUNT: Platelets: 130 K/uL — ABNORMAL LOW (ref 150–400)

## 2023-11-16 LAB — PROTIME-INR
INR: 1.4 — ABNORMAL HIGH (ref 0.8–1.2)
Prothrombin Time: 18.2 s — ABNORMAL HIGH (ref 11.4–15.2)

## 2023-11-16 LAB — APTT: aPTT: 34 s (ref 24–36)

## 2023-11-16 SURGERY — CORONARY ARTERY BYPASS GRAFTING (CABG)
Anesthesia: General | Site: Chest

## 2023-11-16 MED ORDER — PHENYLEPHRINE 80 MCG/ML (10ML) SYRINGE FOR IV PUSH (FOR BLOOD PRESSURE SUPPORT)
PREFILLED_SYRINGE | INTRAVENOUS | Status: AC
  Filled 2023-11-16: qty 10

## 2023-11-16 MED ORDER — HEPARIN SODIUM (PORCINE) 1000 UNIT/ML IJ SOLN
INTRAMUSCULAR | Status: DC | PRN
  Administered 2023-11-16: 34000 [IU] via INTRAVENOUS

## 2023-11-16 MED ORDER — BISACODYL 10 MG RE SUPP: 10.0000 mg | Freq: Every day | RECTAL | Status: DC

## 2023-11-16 MED ORDER — SODIUM CHLORIDE 0.9% FLUSH: 3.0000 mL | INTRAVENOUS | Status: DC | PRN

## 2023-11-16 MED ORDER — OXYCODONE HCL 5 MG PO TABS
5.0000 mg | ORAL_TABLET | ORAL | Status: DC | PRN
  Administered 2023-11-17: 10 mg via ORAL
  Administered 2023-11-17: 5 mg via ORAL
  Administered 2023-11-17: 10 mg via ORAL
  Filled 2023-11-16 (×2): qty 2
  Filled 2023-11-16: qty 1

## 2023-11-16 MED ORDER — ASPIRIN 81 MG PO CHEW
324.0000 mg | CHEWABLE_TABLET | Freq: Once | ORAL | Status: AC
  Administered 2023-11-16: 324 mg via ORAL
  Filled 2023-11-16: qty 4

## 2023-11-16 MED ORDER — FENTANYL CITRATE (PF) 250 MCG/5ML IJ SOLN
INTRAMUSCULAR | Status: AC
  Filled 2023-11-16: qty 5

## 2023-11-16 MED ORDER — ACETAMINOPHEN 160 MG/5ML PO SOLN: 1000.0000 mg | Freq: Four times a day (QID) | ORAL | Status: DC

## 2023-11-16 MED ORDER — DEXMEDETOMIDINE HCL IN NACL 400 MCG/100ML IV SOLN
0.0000 ug/kg/h | INTRAVENOUS | Status: DC
  Administered 2023-11-16: 0.5 ug/kg/h via INTRAVENOUS
  Filled 2023-11-16: qty 100

## 2023-11-16 MED ORDER — FENTANYL CITRATE (PF) 100 MCG/2ML IJ SOLN
INTRAMUSCULAR | Status: DC | PRN
  Administered 2023-11-16 (×2): 100 ug via INTRAVENOUS
  Administered 2023-11-16: 50 ug via INTRAVENOUS
  Administered 2023-11-16 (×5): 100 ug via INTRAVENOUS
  Administered 2023-11-16: 50 ug via INTRAVENOUS
  Administered 2023-11-16 (×2): 100 ug via INTRAVENOUS
  Administered 2023-11-16: 250 ug via INTRAVENOUS

## 2023-11-16 MED ORDER — INSULIN REGULAR(HUMAN) IN NACL 100-0.9 UT/100ML-% IV SOLN: INTRAVENOUS | Status: AC

## 2023-11-16 MED ORDER — SODIUM CHLORIDE 0.9% IV SOLUTION: Freq: Once | INTRAVENOUS | Status: DC

## 2023-11-16 MED ORDER — VANCOMYCIN HCL IN DEXTROSE 1-5 GM/200ML-% IV SOLN
1000.0000 mg | Freq: Once | INTRAVENOUS | Status: AC
  Administered 2023-11-16: 1000 mg via INTRAVENOUS
  Filled 2023-11-16: qty 200

## 2023-11-16 MED ORDER — NOREPINEPHRINE 4 MG/250ML-% IV SOLN: 0.0000 ug/min | INTRAVENOUS | Status: DC

## 2023-11-16 MED ORDER — BISACODYL 5 MG PO TBEC
10.0000 mg | DELAYED_RELEASE_TABLET | Freq: Every day | ORAL | Status: DC
  Administered 2023-11-17 – 2023-11-19 (×3): 10 mg via ORAL
  Filled 2023-11-16 (×3): qty 2

## 2023-11-16 MED ORDER — 0.9 % SODIUM CHLORIDE (POUR BTL) OPTIME
TOPICAL | Status: DC | PRN
  Administered 2023-11-16: 5000 mL

## 2023-11-16 MED ORDER — LACTATED RINGERS IV SOLN: INTRAVENOUS | Status: DC

## 2023-11-16 MED ORDER — LACTATED RINGERS IV SOLN: INTRAVENOUS | Status: DC | PRN

## 2023-11-16 MED ORDER — EPHEDRINE SULFATE-NACL 50-0.9 MG/10ML-% IV SOSY
PREFILLED_SYRINGE | INTRAVENOUS | Status: DC | PRN
Start: 2023-11-16 — End: 2023-11-16
  Administered 2023-11-16 (×3): 5 mg via INTRAVENOUS

## 2023-11-16 MED ORDER — EPINEPHRINE HCL 5 MG/250ML IV SOLN IN NS: 0.0000 ug/min | INTRAVENOUS | Status: DC

## 2023-11-16 MED ORDER — VASOPRESSIN 20 UNITS/100 ML INFUSION FOR SHOCK: 0.0000 [IU]/min | INTRAVENOUS | Status: DC

## 2023-11-16 MED ORDER — PROTAMINE SULFATE 10 MG/ML IV SOLN
INTRAVENOUS | Status: DC | PRN
  Administered 2023-11-16: 10 mg via INTRAVENOUS
  Administered 2023-11-16: 320 mg via INTRAVENOUS
  Administered 2023-11-16: 10 mg via INTRAVENOUS
  Administered 2023-11-16: 20 mg via INTRAVENOUS

## 2023-11-16 MED ORDER — MIDAZOLAM HCL 5 MG/5ML IJ SOLN
INTRAMUSCULAR | Status: DC | PRN
  Administered 2023-11-16: 3 mg via INTRAVENOUS
  Administered 2023-11-16 (×2): 1 mg via INTRAVENOUS
  Administered 2023-11-16: 2 mg via INTRAVENOUS

## 2023-11-16 MED ORDER — PROPOFOL 10 MG/ML IV BOLUS
INTRAVENOUS | Status: AC
  Filled 2023-11-16: qty 20

## 2023-11-16 MED ORDER — ROCURONIUM BROMIDE 10 MG/ML (PF) SYRINGE
PREFILLED_SYRINGE | INTRAVENOUS | Status: DC | PRN
  Administered 2023-11-16: 30 mg via INTRAVENOUS
  Administered 2023-11-16: 50 mg via INTRAVENOUS
  Administered 2023-11-16 (×2): 20 mg via INTRAVENOUS
  Administered 2023-11-16: 100 mg via INTRAVENOUS

## 2023-11-16 MED ORDER — MIDAZOLAM HCL (PF) 10 MG/2ML IJ SOLN
INTRAMUSCULAR | Status: AC
  Filled 2023-11-16: qty 2

## 2023-11-16 MED ORDER — THROMBIN (RECOMBINANT) 20000 UNITS EX SOLR
CUTANEOUS | Status: AC
  Filled 2023-11-16: qty 20000

## 2023-11-16 MED ORDER — CEFAZOLIN SODIUM-DEXTROSE 2-4 GM/100ML-% IV SOLN
2.0000 g | Freq: Three times a day (TID) | INTRAVENOUS | Status: AC
  Administered 2023-11-16 – 2023-11-18 (×6): 2 g via INTRAVENOUS
  Filled 2023-11-16 (×6): qty 100

## 2023-11-16 MED ORDER — MORPHINE SULFATE (PF) 2 MG/ML IV SOLN
1.0000 mg | INTRAVENOUS | Status: DC | PRN
  Administered 2023-11-16: 2 mg via INTRAVENOUS
  Filled 2023-11-16: qty 1

## 2023-11-16 MED ORDER — PHENYLEPHRINE 80 MCG/ML (10ML) SYRINGE FOR IV PUSH (FOR BLOOD PRESSURE SUPPORT)
PREFILLED_SYRINGE | INTRAVENOUS | Status: DC | PRN
  Administered 2023-11-16 (×2): 80 ug via INTRAVENOUS
  Administered 2023-11-16: 160 ug via INTRAVENOUS
  Administered 2023-11-16: 80 ug via INTRAVENOUS
  Administered 2023-11-16: 160 ug via INTRAVENOUS
  Administered 2023-11-16: 80 ug via INTRAVENOUS

## 2023-11-16 MED ORDER — HEPARIN SODIUM (PORCINE) 1000 UNIT/ML IJ SOLN
INTRAMUSCULAR | Status: AC
  Filled 2023-11-16: qty 10

## 2023-11-16 MED ORDER — ONDANSETRON HCL 4 MG/2ML IJ SOLN
4.0000 mg | Freq: Four times a day (QID) | INTRAMUSCULAR | Status: DC | PRN
  Filled 2023-11-16 (×2): qty 2

## 2023-11-16 MED ORDER — SODIUM CHLORIDE 0.9 % IV SOLN
INTRAVENOUS | Status: DC | PRN
  Administered 2023-11-16: 1500 mg via INTRAVENOUS

## 2023-11-16 MED ORDER — METOPROLOL TARTRATE 5 MG/5ML IV SOLN
2.5000 mg | INTRAVENOUS | Status: DC | PRN
  Administered 2023-11-18: 2.5 mg via INTRAVENOUS
  Filled 2023-11-16: qty 5

## 2023-11-16 MED ORDER — PROPOFOL 10 MG/ML IV BOLUS
INTRAVENOUS | Status: DC | PRN
  Administered 2023-11-16 (×3): 50 mg via INTRAVENOUS

## 2023-11-16 MED ORDER — ROCURONIUM BROMIDE 10 MG/ML (PF) SYRINGE
PREFILLED_SYRINGE | INTRAVENOUS | Status: AC
  Filled 2023-11-16: qty 20

## 2023-11-16 MED ORDER — MAGNESIUM SULFATE 4 GM/100ML IV SOLN
4.0000 g | Freq: Once | INTRAVENOUS | Status: AC
  Administered 2023-11-16: 4 g via INTRAVENOUS
  Filled 2023-11-16: qty 100

## 2023-11-16 MED ORDER — ACETAMINOPHEN 160 MG/5ML PO SOLN
650.0000 mg | Freq: Once | ORAL | Status: AC
  Administered 2023-11-16: 650 mg
  Filled 2023-11-16: qty 20.3

## 2023-11-16 MED ORDER — PANTOPRAZOLE SODIUM 40 MG PO TBEC
40.0000 mg | DELAYED_RELEASE_TABLET | Freq: Every day | ORAL | Status: DC
  Administered 2023-11-18 – 2023-11-21 (×4): 40 mg via ORAL
  Filled 2023-11-16 (×4): qty 1

## 2023-11-16 MED ORDER — ASPIRIN 81 MG PO CHEW
324.0000 mg | CHEWABLE_TABLET | Freq: Every day | ORAL | Status: DC
  Filled 2023-11-16: qty 4

## 2023-11-16 MED ORDER — ALBUMIN HUMAN 5 % IV SOLN
250.0000 mL | INTRAVENOUS | Status: DC | PRN
  Administered 2023-11-16 (×2): 12.5 g via INTRAVENOUS
  Filled 2023-11-16: qty 250

## 2023-11-16 MED ORDER — PANTOPRAZOLE SODIUM 40 MG IV SOLR
40.0000 mg | Freq: Every day | INTRAVENOUS | Status: AC
  Administered 2023-11-16 – 2023-11-17 (×2): 40 mg via INTRAVENOUS
  Filled 2023-11-16 (×2): qty 10

## 2023-11-16 MED ORDER — MIDAZOLAM HCL 2 MG/2ML IJ SOLN: 2.0000 mg | INTRAMUSCULAR | Status: DC | PRN

## 2023-11-16 MED ORDER — THROMBIN 20000 UNITS EX SOLR: CUTANEOUS | Status: DC | PRN

## 2023-11-16 MED ORDER — DEXTROSE 50 % IV SOLN
0.0000 mL | INTRAVENOUS | Status: DC | PRN
  Administered 2023-11-17: 40 mL via INTRAVENOUS
  Filled 2023-11-16: qty 50

## 2023-11-16 MED ORDER — SODIUM CHLORIDE 0.9 % IV SOLN: 250.0000 mL | INTRAVENOUS | Status: DC

## 2023-11-16 MED ORDER — METOPROLOL TARTRATE 25 MG/10 ML ORAL SUSPENSION
12.5000 mg | Freq: Two times a day (BID) | ORAL | Status: DC
  Filled 2023-11-16: qty 10

## 2023-11-16 MED ORDER — ASPIRIN 325 MG PO TBEC
325.0000 mg | DELAYED_RELEASE_TABLET | Freq: Every day | ORAL | Status: DC
  Administered 2023-11-17 – 2023-11-21 (×5): 325 mg via ORAL
  Filled 2023-11-16 (×5): qty 1

## 2023-11-16 MED ORDER — VASOPRESSIN 20 UNIT/ML IV SOLN
INTRAVENOUS | Status: DC | PRN
Start: 2023-11-16 — End: 2023-11-16
  Administered 2023-11-16: .5 [IU] via INTRAVENOUS

## 2023-11-16 MED ORDER — TRAMADOL HCL 50 MG PO TABS
50.0000 mg | ORAL_TABLET | ORAL | Status: DC | PRN
  Administered 2023-11-16 – 2023-11-17 (×3): 100 mg via ORAL
  Administered 2023-11-18: 50 mg via ORAL
  Filled 2023-11-16: qty 1
  Filled 2023-11-16 (×3): qty 2

## 2023-11-16 MED ORDER — SODIUM CHLORIDE 0.45 % IV SOLN: INTRAVENOUS | Status: DC | PRN

## 2023-11-16 MED ORDER — SODIUM CHLORIDE 0.9 % IV SOLN: INTRAVENOUS | Status: DC

## 2023-11-16 MED ORDER — SODIUM CHLORIDE 0.9% IV SOLUTION: Freq: Once | INTRAVENOUS | Status: AC

## 2023-11-16 MED ORDER — HEMOSTATIC AGENTS (NO CHARGE) OPTIME
TOPICAL | Status: DC | PRN
  Administered 2023-11-16: 1 via TOPICAL

## 2023-11-16 MED ORDER — ALBUMIN HUMAN 5 % IV SOLN
INTRAVENOUS | Status: DC | PRN
Start: 2023-11-16 — End: 2023-11-16

## 2023-11-16 MED ORDER — METOCLOPRAMIDE HCL 5 MG/ML IJ SOLN
10.0000 mg | Freq: Four times a day (QID) | INTRAMUSCULAR | Status: AC
  Administered 2023-11-16 – 2023-11-17 (×5): 10 mg via INTRAVENOUS
  Filled 2023-11-16 (×6): qty 2

## 2023-11-16 MED ORDER — HEPARIN SODIUM (PORCINE) 1000 UNIT/ML IJ SOLN
INTRAMUSCULAR | Status: AC
  Filled 2023-11-16: qty 1

## 2023-11-16 MED ORDER — SODIUM BICARBONATE 8.4 % IV SOLN
50.0000 meq | Freq: Once | INTRAVENOUS | Status: AC
  Administered 2023-11-16: 50 meq via INTRAVENOUS

## 2023-11-16 MED ORDER — DOCUSATE SODIUM 100 MG PO CAPS
200.0000 mg | ORAL_CAPSULE | Freq: Every day | ORAL | Status: DC
  Administered 2023-11-17 – 2023-11-19 (×3): 200 mg via ORAL
  Filled 2023-11-16 (×3): qty 2

## 2023-11-16 MED ORDER — CHLORHEXIDINE GLUCONATE 0.12 % MT SOLN
15.0000 mL | OROMUCOSAL | Status: AC
  Administered 2023-11-16: 15 mL via OROMUCOSAL
  Filled 2023-11-16: qty 15

## 2023-11-16 MED ORDER — PLASMA-LYTE A IV SOLN: INTRAVENOUS | Status: DC | PRN

## 2023-11-16 MED ORDER — CHLORHEXIDINE GLUCONATE CLOTH 2 % EX PADS
6.0000 | MEDICATED_PAD | Freq: Every day | CUTANEOUS | Status: DC
  Administered 2023-11-16 – 2023-11-20 (×5): 6 via TOPICAL

## 2023-11-16 MED ORDER — POTASSIUM CHLORIDE 10 MEQ/50ML IV SOLN: 10.0000 meq | INTRAVENOUS | Status: AC

## 2023-11-16 MED ORDER — ACETAMINOPHEN 500 MG PO TABS
1000.0000 mg | ORAL_TABLET | Freq: Four times a day (QID) | ORAL | Status: DC
  Administered 2023-11-16 – 2023-11-20 (×15): 1000 mg via ORAL
  Filled 2023-11-16 (×14): qty 2

## 2023-11-16 MED ORDER — NITROGLYCERIN IN D5W 200-5 MCG/ML-% IV SOLN: 0.0000 ug/min | INTRAVENOUS | Status: DC

## 2023-11-16 MED ORDER — METOPROLOL TARTRATE 12.5 MG HALF TABLET
12.5000 mg | ORAL_TABLET | Freq: Two times a day (BID) | ORAL | Status: DC
  Administered 2023-11-17 – 2023-11-19 (×6): 12.5 mg via ORAL
  Filled 2023-11-16 (×6): qty 1

## 2023-11-16 MED ORDER — VASOPRESSIN 20 UNIT/ML IV SOLN
INTRAVENOUS | Status: AC
  Filled 2023-11-16: qty 1

## 2023-11-16 MED ORDER — SODIUM CHLORIDE 0.9% FLUSH
3.0000 mL | Freq: Two times a day (BID) | INTRAVENOUS | Status: DC
  Administered 2023-11-17: 3 mL via INTRAVENOUS

## 2023-11-16 SURGICAL SUPPLY — 85 items
ADAPTER MULTI PERFUSION 15 (ADAPTER) ×4 IMPLANT
BAG DECANTER FOR FLEXI CONT (MISCELLANEOUS) ×4 IMPLANT
BLADE CLIPPER SURG (BLADE) ×4 IMPLANT
BLADE STERNUM SYSTEM 6 (BLADE) ×4 IMPLANT
BLADE SURG 11 STRL SS (BLADE) ×1 IMPLANT
BNDG ELASTIC 4INX 5YD STR LF (GAUZE/BANDAGES/DRESSINGS) ×1 IMPLANT
BNDG ELASTIC 4X5.8 VLCR STR LF (GAUZE/BANDAGES/DRESSINGS) ×3 IMPLANT
BNDG ELASTIC 6INX 5YD STR LF (GAUZE/BANDAGES/DRESSINGS) ×4 IMPLANT
BNDG GAUZE DERMACEA FLUFF 4 (GAUZE/BANDAGES/DRESSINGS) ×4 IMPLANT
CANISTER SUCTION 3000ML PPV (SUCTIONS) ×4 IMPLANT
CANNULA AORTIC ROOT 9FR (CANNULA) ×4 IMPLANT
CANNULA EZ GLIDE AORTIC 21FR (CANNULA) IMPLANT
CANNULA MC2 2 STG 36/46 NON-V (CANNULA) ×1 IMPLANT
CANNULA NON VENT 20FR 12 (CANNULA) ×1 IMPLANT
CANNULA VESSEL 3MM BLUNT TIP (CANNULA) ×10 IMPLANT
CATH ROBINSON RED A/P 18FR (CATHETERS) ×5 IMPLANT
CATH THOR RT ANG 28F 9128 SOFT (CATHETERS) ×1 IMPLANT
CATH THORACIC 28FR (CATHETERS) ×1 IMPLANT
CATH THORACIC 36FR (CATHETERS) ×1 IMPLANT
CLIP TI MEDIUM 24 (CLIP) IMPLANT
CLIP TI WIDE RED SMALL 24 (CLIP) ×1 IMPLANT
CONTAINER PROTECT SURGISLUSH (MISCELLANEOUS) ×8 IMPLANT
DERMABOND ADVANCED .7 DNX12 (GAUZE/BANDAGES/DRESSINGS) ×1 IMPLANT
DRAPE SRG 135X102X78XABS (DRAPES) ×4 IMPLANT
DRAPE WARM FLUID 44X44 (DRAPES) ×3 IMPLANT
DRSG COVADERM 4X14 (GAUZE/BANDAGES/DRESSINGS) ×4 IMPLANT
ELECTRODE REM PT RTRN 9FT ADLT (ELECTROSURGICAL) ×8 IMPLANT
FELT TEFLON 1X6 (MISCELLANEOUS) ×4 IMPLANT
GAUZE SPONGE 4X4 12PLY STRL (GAUZE/BANDAGES/DRESSINGS) ×7 IMPLANT
GLOVE BIO SURGEON STRL SZ 6 (GLOVE) ×2 IMPLANT
GLOVE BIO SURGEON STRL SZ 6.5 (GLOVE) ×2 IMPLANT
GLOVE BIOGEL PI IND STRL 6.5 (GLOVE) ×3 IMPLANT
GLOVE BIOGEL PI IND STRL 7.0 (GLOVE) ×3 IMPLANT
GLOVE SS BIOGEL STRL SZ 7 (GLOVE) ×3 IMPLANT
GLOVE SS BIOGEL STRL SZ 7.5 (GLOVE) ×1 IMPLANT
GOWN STRL REUS W/ TWL LRG LVL3 (GOWN DISPOSABLE) ×21 IMPLANT
GOWN STRL REUS W/ TWL XL LVL3 (GOWN DISPOSABLE) ×6 IMPLANT
HEMOSTAT POWDER SURGIFOAM 1G (HEMOSTASIS) ×12 IMPLANT
HEMOSTAT SURGICEL 2X14 (HEMOSTASIS) ×4 IMPLANT
INSERT FOGARTY XLG (MISCELLANEOUS) ×1 IMPLANT
KIT BASIN OR (CUSTOM PROCEDURE TRAY) ×4 IMPLANT
KIT SUCTION CATH 14FR (SUCTIONS) ×4 IMPLANT
KIT TURNOVER KIT B (KITS) ×4 IMPLANT
KIT VASOVIEW HEMOPRO 2 VH 4000 (KITS) ×4 IMPLANT
MARKER GRAFT CORONARY BYPASS (MISCELLANEOUS) ×12 IMPLANT
NS IRRIG 1000ML POUR BTL (IV SOLUTION) ×20 IMPLANT
PACK E OPEN HEART (SUTURE) ×4 IMPLANT
PACK OPEN HEART (CUSTOM PROCEDURE TRAY) ×4 IMPLANT
PAD ARMBOARD POSITIONER FOAM (MISCELLANEOUS) ×8 IMPLANT
PAD ELECT DEFIB RADIOL ZOLL (MISCELLANEOUS) ×4 IMPLANT
PENCIL BUTTON HOLSTER BLD 10FT (ELECTRODE) ×4 IMPLANT
POSITIONER HEAD DONUT 9IN (MISCELLANEOUS) ×4 IMPLANT
PUNCH AORTIC ROTATE 4.0MM (MISCELLANEOUS) ×1 IMPLANT
PUNCH AORTIC ROTATE 4.5MM 8IN (MISCELLANEOUS) IMPLANT
PUNCH AORTIC ROTATE 5MM 8IN (MISCELLANEOUS) IMPLANT
SET MPS 3-ND DEL (MISCELLANEOUS) ×1 IMPLANT
STOPCOCK 4 WAY LG BORE MALE ST (IV SETS) ×1 IMPLANT
SUPPORT HEART JANKE-BARRON (MISCELLANEOUS) ×4 IMPLANT
SUT BONE WAX W31G (SUTURE) ×4 IMPLANT
SUT MNCRL AB 4-0 PS2 18 (SUTURE) ×2 IMPLANT
SUT PROLENE 4 0 SH DA (SUTURE) ×5 IMPLANT
SUT PROLENE 4-0 RB1 .5 CRCL 36 (SUTURE) ×3 IMPLANT
SUT PROLENE 6 0 C 1 30 (SUTURE) ×8 IMPLANT
SUT PROLENE 7 0 BV 1 (SUTURE) ×1 IMPLANT
SUT PROLENE 7 0 BV1 MDA (SUTURE) ×5 IMPLANT
SUT PROLENE 8 0 BV175 6 (SUTURE) IMPLANT
SUT SILK 2 0SH CR/8 30 (SUTURE) ×2 IMPLANT
SUT STEEL 6MS V (SUTURE) ×4 IMPLANT
SUT STEEL STERNAL CCS#1 18IN (SUTURE) IMPLANT
SUT STEEL SZ 6 DBL 3X14 BALL (SUTURE) ×5 IMPLANT
SUT VIC AB 1 CTX36XBRD ANBCTR (SUTURE) ×9 IMPLANT
SUT VIC AB 2-0 CT1 TAPERPNT 27 (SUTURE) ×1 IMPLANT
SUT VIC AB 2-0 CTX 27 (SUTURE) ×2 IMPLANT
SUT VIC AB 3-0 SH 27X BRD (SUTURE) IMPLANT
SYSTEM SAHARA CHEST DRAIN ATS (WOUND CARE) ×4 IMPLANT
TAPE CLOTH SURG 4X10 WHT LF (GAUZE/BANDAGES/DRESSINGS) ×1 IMPLANT
TAPE PAPER 2X10 WHT MICROPORE (GAUZE/BANDAGES/DRESSINGS) ×1 IMPLANT
TOWEL GREEN STERILE (TOWEL DISPOSABLE) ×4 IMPLANT
TOWEL GREEN STERILE FF (TOWEL DISPOSABLE) ×4 IMPLANT
TRAY FOLEY SLVR 16FR TEMP STAT (SET/KITS/TRAYS/PACK) ×4 IMPLANT
TUBE SUCT INTRACARD DLP 20F (MISCELLANEOUS) ×4 IMPLANT
TUBE SUCTION CARDIAC 10FR (CANNULA) ×4 IMPLANT
TUBING LAP HI FLOW INSUFFLATIO (TUBING) ×4 IMPLANT
UNDERPAD 30X36 HEAVY ABSORB (UNDERPADS AND DIAPERS) ×4 IMPLANT
WATER STERILE IRR 1000ML POUR (IV SOLUTION) ×8 IMPLANT

## 2023-11-16 NOTE — Anesthesia Procedure Notes (Signed)
 Central Venous Catheter Insertion Performed by: Darlyn Rush, MD, anesthesiologist Start/End9/06/2023 7:05 AM, 11/16/2023 7:15 AM Patient location: Pre-op. Preanesthetic checklist: patient identified, IV checked, site marked, risks and benefits discussed, surgical consent, monitors and equipment checked, pre-op evaluation, timeout performed and anesthesia consent Hand hygiene performed  and maximum sterile barriers used  PA cath was placed.Swan type:thermodilution PA Cath depth:45 Procedure performed without using ultrasound guided technique. Ultrasound Notes:image(s) printed for medical record Attempts: 1 Following insertion, Biopatch. Post procedure assessment: free fluid flow, no air and blood return through all ports  Patient tolerated the procedure well with no immediate complications.

## 2023-11-16 NOTE — Procedures (Signed)
 Extubation Procedure Note  Patient Details:   Name: ZYSHONNE MALECHA DOB: December 02, 1955 MRN: [id number]   Airway Documentation:    Vent end date: 11/16/23 Vent end time: 1840   Evaluation  O2 sats: stable throughout Complications: No apparent complications Patient did tolerate procedure well.    Yes  Patient extubated per protocol to 4L [place] with no apparent complications. Positive cuff leak was noted prior to extubation. Patient achieved NIF of -28 and VC of .91L with good patient effort. Patient is alert and oriented, has strong cough, and is able to weakly speak. Vitals are stable. RT will continue to monitor.   Dare Sanger CHRISTELLA Anon 11/16/2023, 6:45 PM

## 2023-11-16 NOTE — Hospital Course (Addendum)
 HPI: The patient was recently evaluated by cardiology following a recent trauma with pneumothorax at which time he was found to have an incidental finding of coronary artery calcifications on a CT scan.  On evaluation, he was noted to been having symptoms of exertional anginal discomfort usually when walking on the treadmill..  Additionally the trauma was reportedly related to an exertional syncopal event but he relates it more to a slip and fall.  Cardiology evaluation did reveal an elevated LP(a) of 110 and subsequently a cardiac CTA was performed.  He was found to have findings consistent with multivessel coronary artery disease and coronary calcium  score was significantly elevated at 1319.  FFR study showed reduced FFR in the LAD and mid right coronary artery and subsequently it was felt that he should undergo cardiac catheterization.  This was performed on today's date and he is found to have significant left main as well as significant three-vessel coronary artery disease.  LVEDP was noted to be 23 mmHg.  Full report is as described below.  An echocardiogram has been scheduled but results are currently pending.  Significant cardiac risk factors include hyperlipidemia, erectile dysfunction, obesity hypertension and tobacco abuse.   Dr. Daniel discussed the need for coronary artery bypass grafting surgery. Potential risks, benefits, and complications of the surgery were discussed with the patient and he agreed to proceed with surgery.  Hospital Course: Patient underwent a CABG x 3. He was transported from the OR to [facility] ICU. He was extubated the evening of surgery without complication. Drips were weaned as hemodynamics tolerated, swan ganz catheter and arterial line was removed on POD1. He was started on Lopressor  and routinely diuresed. He developed short runs of controlled rate atrial fibrillation. IV Amiodarone  protocol was started. He converted to NSR, IV Amiodarone  was transitioned to PO Amiodarone . He  developed expected thrombocytopenia and was transfused with 1U FFP. Platelets were up to 115,000 on post op day 4. He was above his pre op weight so he was diuresed with Lasix . He continued to maintain sinus rhythm. Lopressor  was increased to 25 mg bid for better HR/BP control. He was felt stable for transfer from the ICU to 4E for further convalescence on 09/08. He has been tolerating a diet and has had bowel movements. He has been ambulating on room air with good oxygenation. Sternal and RLE wounds are clean, dry, healing without signs of infection.  He is felt stable for discharge today.

## 2023-11-16 NOTE — Brief Op Note (Addendum)
 11/15/2023 - 11/16/2023  9:57 AM  PATIENT:  Anthony Boone  68 y.o. male  PRE-OPERATIVE DIAGNOSIS:  CORONARY ARTERY DISEASE  POST-OPERATIVE DIAGNOSIS:  CORONARY ARTERY DISEASE  PROCEDURE:  INTRAOPERATIVE TRANSESOPHAGEAL ECHOCARDIOGRAM, CORONARY ARTERY BYPASS GRAFTING (CABG) TIMES 3 (LIMA to LAD, SVG to OM, SVG to ramus intermedius) USING LEFT INTERNAL MAMMARY ARTERY AND ENDOSCOPICALLY HARVESTED RIGHT GREATER SAPHENOUS VEIN  Vein harvest time: 36 min Vein prep time: 15 min  SURGEON:  Surgeons and Role:    Daniel Con RAMAN, MD - Primary    Kerrin Elspeth BROCKS, MD - Assisting  PHYSICIAN ASSISTANT: Kyla Donald PA-C  ASSISTANTS: Doyal Music RNFA   ANESTHESIA:   general  EBL:  Per anesthesia, perfusion record  DRAINS: Chest tubes placed in the mediastinal and pleural spaces   COUNTS CORRECT:  YES  DICTATION: .Dragon Dictation  PLAN OF CARE: Admit to inpatient   PATIENT DISPOSITION:  ICU - intubated and hemodynamically stable.   Delay start of Pharmacological VTE agent (>24hrs) due to surgical blood loss or risk of bleeding: yes  BASELINE WEIGHT: 97.5 kg

## 2023-11-16 NOTE — Transfer of Care (Signed)
 Immediate Anesthesia Transfer of Care Note  Patient: BRAYCEN BURANDT  Procedure(s) Performed: CORONARY ARTERY BYPASS GRAFTING (CABG) TIMES THREE USING LEFT INTERNAL MAMMARY ARTERY AND ENDOSCOPICALLY HARVESTED RIGHT GREATER SAPHENOUS VEIN (Chest) ECHOCARDIOGRAM, TRANSESOPHAGEAL, INTRAOPERATIVE  Patient Location: ICU  Anesthesia Type:General  Level of Consciousness: Patient remains intubated per anesthesia plan  Airway & Oxygen Therapy: Patient remains intubated per anesthesia plan and Patient placed on Ventilator (see vital sign flow sheet for setting)  Post-op Assessment: Report given to RN and Post -op Vital signs reviewed and stable  Post vital signs: Reviewed and stable  Last Vitals:  Vitals Value Taken Time  BP 103/54 11/16/23 14:50  Temp 36 C 11/16/23 14:48  Pulse 76 11/16/23 14:48  Resp 18 11/16/23 14:48  SpO2 98 % 11/16/23 14:48  Vitals shown include unfiled device data.  Last Pain:  Vitals:   11/16/23 0657  TempSrc:   PainSc: 0-No pain      Patients Stated Pain Goal: 4 (11/15/23 0948)  Complications: No notable events documented.

## 2023-11-16 NOTE — Op Note (Signed)
 Date of Surgery: 11/16/23   Preop Diagnosis:  Multivessel CAD Postop Diagnosis: Same Surgeon:  Con GORMAN [name], MD Assistant(s):  Dr. Elspeth Millers, and Kyla Donald PA-C (performed right [facility]) Anesthesia: GET  Procedures: 1. CABG x 3 (SVG - OM, SVG - Ramus, LIMA - LAD) 2. Endoscopic Vein Harvest   Cross clamp time: 83 min Bypass time: 123 min  Approach:  Median sternotomy Drains: 1 left pleural 28 fr, 1 mediastinal 28 Fr right angle, 1 mediastinal 36 Fr straight Pacing Wires: atrial and ventricular Findings of Procedure: 1. Heavily calcified RCA, diminutive PDA and PL  Estimated Blood Loss: 1500 mL Blood Products Administered: None Disposition:  ICU Specimens:  none Indication: Mr. Brawley is a 68 year old man with history of chest pain on exertion who was admitted after elective LHC demonstrated severe LM disease.  He was deemed a good candidate for surgery.   Procedure in Detail:  The risks, benefits, complications, treatment options, and expected outcomes were discussed with the patient. The possibilities of reaction to medication, pulmonary aspiration, perforation of viscus, bleeding, recurrent infection, the need for additional procedures, failure to diagnose a condition, and creating a complication requiring transfusion or operation were discussed with the patient. The patient concurred with the proposed plan, giving informed consent. The patient was taken to the operating room, identified as TYRANN DONAHO and the procedure verified as Coronary Artery Bypass Grafting. A Time Out was held and the above information confirmed.  The patient was prepped and draped in a sterile fashion. An endoscopic vein harvesting in the right leg was carried out by Donielle Zimmerman PA-C.  A standard median sternotomy was performed. The left internal mammary artery was taken down using cautery and clips and left pleural chest tube was placed.  Intervenous heparin  was given at the  appropriate dose to obtain anticoagulation sufficient for CPB. The LIMA was then transected distally. The flow was excellent.   The pericardium was then opened and the pericardial well was created. The aorta was palpated and free of calcium /plaque.  Concentric purse strings were placed and the aorta was cannulated with a 20 fr EOPA cannula. After adequate de-airing this cannula was connected to the CPB circuit.  A purse string was placed around the right atrial appendage and it was cannulated with a large triple stage venous cannula. Prior to initiating CPB the LIMA was fashioned to the appropriate length.   Cardiopulmonary bypass was then initiated. The distal targets (OM1, ramus intermedius and LAD)  were then evaluated and marked. The heart basket was then placed into the appropriate position. An antegrade vent/cardioplegia cannula was then placed. The aortic cross clamp was then applied and the heart was arrested with antegrade cardioplegia. Cardioplegia was then administered intermittantly to ensure adequate myocardial protection.   The distal anastomosis to the OM1 was completed first. After positioning the heart, the artery was opened and RSV was ananstomosed to the artery using 7-0 prolene in a running fashion. The flow was tested and was adequate.   Next, the distal anastomosis to  to the ramus intermedius was completed. After positioning the heart, the artery was opened and RSV was ananstomosed to the artery using 7-0 prolene in a running fashion. The flow was tested and was adequate.    Next the LIMA to LAD anastomosis was completed in a similar fashion with 7-0 prolene.    The proximal anastomoses were then completed in the standard fashion using 6-0 prolene in a running fashion. After adequate  deairing maneuvers, the cross clamp was removed and the heart was reperfused.   Examination of all surgical sites revealed good hemostasis. Atrial and ventricular epicardial pacing wires were then  placed. The patient was fully ventillated and successfully weaned off of CPB. After a test dose of protamine , the patient was fully decannulated.  The heparin  was fully reversed with protamine . Again, examination of all surgical sites were evaluated for hemostasis which was good.  Post-bypass TEE revealed preserved RV/LV function and no new wall motion abnormalities.    Mediastinal drains were placed. The sternum was closed using stainless steel wires. The fascia was closed with #1 vicryl and the deep dermal with 2-0 Vicryl. The skin was closed with 3-0 Vicryl. A sterile dressing was applied over the incision.  At the end of the operation, all sponge, instruments, and needle counts were correct.   The patient tolerated the procedure without complication and was transported to the CTICU in stable condition.  Complications: none  Specimen: None  Implants: None

## 2023-11-16 NOTE — Anesthesia Postprocedure Evaluation (Incomplete)
 Anesthesia Post Note  Patient: Anthony Boone  Procedure(s) Performed: CORONARY ARTERY BYPASS GRAFTING (CABG) TIMES THREE USING LEFT INTERNAL MAMMARY ARTERY AND ENDOSCOPICALLY HARVESTED RIGHT GREATER SAPHENOUS VEIN (Chest) ECHOCARDIOGRAM, TRANSESOPHAGEAL, INTRAOPERATIVE     Anesthesia Type: General Anesthetic complications: no   No notable events documented.  Last Vitals:  Vitals:   11/16/23 0725 11/16/23 0726  BP: (!) 166/87   Pulse: 70 73  Resp: 18 15  Temp:    SpO2: 95% 96%    Last Pain:  Vitals:   11/16/23 0657  TempSrc:   PainSc: 0-No pain                 Shalese Strahan [place] Vadie Principato

## 2023-11-16 NOTE — Consult Note (Signed)
 NAME:  Anthony Boone, MRN:  [id number], DOB:  05/18/1955, LOS: 1 ADMISSION DATE:  11/15/2023, CONSULTATION DATE: 9//2025 REFERRING MD:  Daniel Benders, CHIEF COMPLAINT: Status post CABG  Thank you yeah  History of Present Illness:  68 year old male with hypertension, hyperlipidemia and obesity who was worked up for exertional syncope, noted to have multivessel coronary artery disease, underwent CABG x 3 Patient remained intubated and was transferred to ICU, PCCM was consulted for help evaluation medical management  Pertinent  Medical History   Past Medical History:  Diagnosis Date   Coronary artery calcification seen on CAT scan 10/09/2023   Erectile dysfunction 01/12/2018   Former smoker 01/15/2018   Hyperlipidemia    Hypertension    Medication management 07/29/2013   Obesity (BMI 30.0-34.9) 01/12/2018   Other abnormal glucose 07/29/2013   Vitamin D  deficiency     Significant Hospital Events: Including procedures, antibiotic start and stop dates in addition to other pertinent events     Interim History / Subjective:  As above  Objective    Blood pressure (!) 166/87, pulse 73, temperature 98.8 F (37.1 C), temperature source Oral, resp. rate 15, height 5' 8 (1.727 m), weight 97.5 kg, SpO2 96%. PAP: (22-270)/(7-268) 45/34      Intake/Output Summary (Last 24 hours) at 11/16/2023 1339 Last data filed at 11/16/2023 1327 Gross per 24 hour  Intake 4245 ml  Output 1225 ml  Net 3020 ml   Filed Weights   11/15/23 0957 11/15/23 1600 11/16/23 9367  Weight: 97.5 kg 97.5 kg 97.5 kg    Examination:   Physical exam: General: Crtitically ill-appearing male, orally intubated HEENT: [place]/AT, eyes anicteric.  ETT and OG in place Neuro: Sedated, not following commands.  Eyes are closed.  Pupils 3 mm bilateral reactive to light Chest: Central sternotomy incision looks clean and dry, coarse breath sounds, no wheezes or rhonchi.  Mediastinal, chest tube and pacer wire in place Heart:  Regular rate and rhythm, no murmurs or gallops Abdomen: Soft, nondistended, bowel sounds present  Labs and images reviewed  Patient Lines/Drains/Airways Status     Active Line/Drains/Airways     Name Placement date Placement time Site Days   Arterial Line 11/16/23 Left Radial 11/16/23  0700  Radial  less than 1   Peripheral IV 11/15/23 20 G 1.88 Left;Anterior Forearm 11/15/23  2301  Forearm  1   Peripheral IV 11/15/23 20 G 2.5 Left;Anterior;Upper Arm 11/15/23  2302  Arm  1   Peripheral IV 11/16/23 18 G Right Wrist 11/16/23  0650  Wrist  less than 1   Urethral Catheter D. Acuna RN Latex;Straight-tip;Temperature probe 16 Fr. 11/16/23  0755  Latex;Straight-tip;Temperature probe  less than 1   Y Chest Tube 1, 2, and 3 1 Medial Mediastinal 28 Fr. 2 Medial Mediastinal 36 Fr. 3 Left Pleural 28 Fr. 11/16/23  1321  -- less than 1   Airway 8 mm 11/16/23  0751  -- less than 1   Pulmonary Artery Catheter 11/16/23 Right 11/16/23  9078  -- less than 1   Wound 11/16/23 0920 Surgical Closed Surgical Incision Chest 11/16/23  0920  Chest  less than 1   Wound 11/16/23 0920 Surgical Closed Surgical Incision Leg Right 11/16/23  0920  Leg  less than 1        Resolved problem list    Assessment and Plan  Multivessel coronary artery disease s/p CABG x 3 Continue aspirin  and statin Chest tube management TCTS Continue to titrate Precedex  with RASS  goal 0/-1 Continue pain control with tramadol , oxycodone  and morphine  Closely monitor chest tube output  Acute respiratory insufficiency, postop Continue on protective ventilation VAP prevention bundle in place Rapid weaning protocol ordered is in place  Hypertension Holding antihypertensive for now, as patient is requiring low-dose Levophed   Hyperlipidemia Continue atorvastatin  Expected perioperative blood loss anemia Thrombocytopenia due to CPB Monitor H/H and PLT counts Patient is receiving 1 unit platelet Closely monitor and watch for signs  of bleeding  Obesity Diet and exercise counseling as appropriate   Labs   CBC: Recent Labs  Lab 11/10/23 1041 11/16/23 0604 11/16/23 0756 11/16/23 1133 11/16/23 1149 11/16/23 1206 11/16/23 1315 11/16/23 1318  WBC 5.8 5.8  --   --   --   --   --   --   HGB 15.7 15.7   < > 10.3* 9.5* 8.8* 8.8* 9.5*  HCT 46.6 44.3   < > 30.0* 28.0* 26.0* 26.0* 28.0*  MCV 90 85.4  --   --   --   --   --   --   PLT 137* 123*  --  130*  --   --   --   --    < > = values in this interval not displayed.    Basic Metabolic Panel: Recent Labs  Lab 11/10/23 1041 11/16/23 0604 11/16/23 0756 11/16/23 0941 11/16/23 1025 11/16/23 1052 11/16/23 1149 11/16/23 1206 11/16/23 1315 11/16/23 1318  NA 140 138   < > 139   < > 131* 132* 135 136 136  K 4.7 4.4   < > 4.1   < > 6.4* 6.4* 6.1* 5.3* 5.3*  CL 102 103   < > 107  --  99 99 100  --  102  CO2 23 24  --   --   --   --   --   --   --   --   GLUCOSE 96 107*   < > 100*  --  127* 141* 140*  --  160*  BUN 16 13   < > 11  --  13 14 14   --  14  CREATININE 1.09 1.01   < > 0.80  --  1.00 1.00 0.90  --  1.00  CALCIUM  9.0 9.1  --   --   --   --   --   --   --   --    < > = values in this interval not displayed.   GFR: Estimated Creatinine Clearance: 81.1 mL/min (by C-G formula based on SCr of 1 mg/dL). Recent Labs  Lab 11/10/23 1041 11/16/23 0604  WBC 5.8 5.8    Liver Function Tests: No results for input(s): AST, ALT, ALKPHOS, BILITOT, PROT, ALBUMIN  in the last 168 hours. No results for input(s): LIPASE, AMYLASE in the last 168 hours. No results for input(s): AMMONIA in the last 168 hours.  ABG    Component Value Date/Time   PHART 7.294 (L) 11/16/2023 1315   PCO2ART 51.9 (H) 11/16/2023 1315   PO2ART 315 (H) 11/16/2023 1315   HCO3 25.2 11/16/2023 1315   TCO2 25 11/16/2023 1318   ACIDBASEDEF 1.0 11/16/2023 1315   O2SAT 100 11/16/2023 1315     Coagulation Profile: No results for input(s): INR, PROTIME in the last 168  hours.  Cardiac Enzymes: No results for input(s): CKTOTAL, CKMB, CKMBINDEX, TROPONINI in the last 168 hours.  HbA1C: Hgb A1c MFr Bld  Date/Time Value Ref Range Status  11/16/2023 06:04 AM  5.2 4.8 - 5.6 % Final    Comment:    (NOTE) Diagnosis of Diabetes The following HbA1c ranges recommended by the American Diabetes Association (ADA) may be used as an aid in the diagnosis of diabetes mellitus.  Hemoglobin             Suggested A1C NGSP%              Diagnosis  <5.7                   Non Diabetic  5.7-6.4                Pre-Diabetic  >6.4                   Diabetic  <7.0                   Glycemic control for                       adults with diabetes.    12/23/2022 11:17 AM 5.2 <5.7 % of total Hgb Final    Comment:    For the purpose of screening for the presence of diabetes: . <5.7%       Consistent with the absence of diabetes 5.7-6.4%    Consistent with increased risk for diabetes             (prediabetes) > or =6.5%  Consistent with diabetes . This assay result is consistent with a decreased risk of diabetes. . Currently, no consensus exists regarding use of hemoglobin A1c for diagnosis of diabetes in children. . According to American Diabetes Association (ADA) guidelines, hemoglobin A1c <7.0% represents optimal control in non-pregnant diabetic patients. Different metrics may apply to specific patient populations.  Standards of Medical Care in Diabetes(ADA). .     CBG: No results for input(s): GLUCAP in the last 168 hours.  Review of Systems:   Unable to obtain as patient is intubated and sedated  Past Medical History:  He,  has a past medical history of Coronary artery calcification seen on CAT scan (10/09/2023), Erectile dysfunction (01/12/2018), Former smoker (01/15/2018), Hyperlipidemia, Hypertension, Medication management (07/29/2013), Obesity (BMI 30.0-34.9) (01/12/2018), Other abnormal glucose (07/29/2013), and Vitamin D  deficiency.    Surgical History:   Past Surgical History:  Procedure Laterality Date   APPENDECTOMY  1974   LEFT HEART CATH AND CORONARY ANGIOGRAPHY N/A 11/15/2023   Procedure: LEFT HEART CATH AND CORONARY ANGIOGRAPHY;  Surgeon: Ladona Heinz, MD;  Location: [facility] INVASIVE [facility];  Service: Cardiovascular;  Laterality: N/A;   VASECTOMY  2005   Dr Donelle     Social History:   reports that he quit smoking about 31 years ago. His smoking use included cigarettes. He started smoking about 50 years ago. He has a 43.5 pack-year smoking history. He has never used smokeless tobacco. He reports current alcohol  use. He reports that he does not use drugs.   Family History:  His family history includes Heart disease in his father; Hypertension in his brother.   Allergies No Known Allergies   Home Medications  Prior to Admission medications   Medication Sig Start Date End Date Taking? Authorizing Provider  aspirin  EC 81 MG tablet Takes 1 tablet Daily 12/02/19  Yes Tonita Fallow, MD  bisoprolol -hydrochlorothiazide  (ZIAC ) 10-6.25 MG tablet Take  1 tablet  Daily   for BP 10/09/23  Yes Almarie Waddell NOVAK, NP  lisinopril  (ZESTRIL ) 40 MG tablet Take  1 tablet   Daily   for BP 10/09/23  Yes Almarie Birmingham B, NP  rosuvastatin  (CRESTOR ) 20 MG tablet Take 1 tablet (20 mg total) by mouth daily. 10/11/23  Yes Almarie Birmingham NOVAK, NP  sildenafil  (VIAGRA ) 50 MG tablet Take 1 tablet (50 mg total) by mouth as needed for erectile dysfunction. Take 1 hour before sexual intercourse. 10/09/23  Yes Almarie Birmingham NOVAK, NP     Critical care time:     The patient is critically ill due to coronary artery disease status post CABG/acute respiratory insufficiency, postprocedure requiring titration of ventilator.  Critical care was necessary to treat or prevent imminent or life-threatening deterioration.  Critical care was time spent personally by me on the following activities: development of treatment plan with patient and/or surrogate as well as nursing,  discussions with consultants, evaluation of patient's response to treatment, examination of patient, obtaining history from patient or surrogate, ordering and performing treatments and interventions, ordering and review of laboratory studies, ordering and review of radiographic studies, pulse oximetry, re-evaluation of patient's condition and participation in multidisciplinary rounds.   During this encounter critical care time was devoted to patient care services described in this note for 39 minutes.     Valinda Novas, MD [facility] See Amion for pager If no response to pager, please call [phone] until 7pm After 7pm, Please call E-link [phone]

## 2023-11-16 NOTE — Discharge Instructions (Addendum)
 Discharge Instructions:  1. You may shower, please wash incisions daily with soap and water  and keep dry.  If you wish to cover wounds with dressing you may do so but please keep clean and change daily.  No tub baths or swimming until incisions have completely healed.  If your incisions become red or develop any drainage please call our office at [phone]  2. No Driving until cleared by Dr. Linward office and you are no longer using narcotic pain medications  3. Monitor your weight daily.. Please use the same scale and weigh at same time... If you gain 5-10 lbs in 48 hours with associated lower extremity swelling, please contact our office at [phone]  4. Fever of 101.5 for at least 24 hours with no source, please contact our office at [phone]  5. Activity- up as tolerated, please walk at least 3 times per day.  Avoid strenuous activity, no lifting, pushing, or pulling with your arms over 8-10 lbs for a minimum of 6 weeks  6. If any questions or concerns arise, please do not hesitate to contact our office at [phone]

## 2023-11-16 NOTE — Anesthesia Procedure Notes (Signed)
 Procedure Name: Intubation Date/Time: 11/16/2023 7:51 AM  Performed by: Lettie Derrek Dacosta, CRNAPre-anesthesia Checklist: Patient identified, Patient being monitored, Timeout performed, Emergency Drugs available and Suction available Patient Re-evaluated:Patient Re-evaluated prior to induction Oxygen Delivery Method: Circle System Utilized Preoxygenation: Pre-oxygenation with 100% oxygen Induction Type: IV induction Ventilation: Mask ventilation without difficulty Laryngoscope Size: Mac and 4 Grade View: Grade I Tube type: Oral Tube size: 8.0 mm Number of attempts: 1 Airway Equipment and Method: Stylet Placement Confirmation: ETT inserted through vocal cords under direct vision, positive ETCO2 and breath sounds checked- equal and bilateral Secured at: 25 cm Tube secured with: Tape Dental Injury: Teeth and Oropharynx as per pre-operative assessment

## 2023-11-16 NOTE — Anesthesia Procedure Notes (Signed)
 Central Venous Catheter Insertion Performed by: Darlyn Rush, MD, anesthesiologist Start/End9/06/2023 6:50 AM, 11/16/2023 7:05 AM Patient location: Pre-op. Preanesthetic checklist: patient identified, IV checked, site marked, risks and benefits discussed, surgical consent, monitors and equipment checked, pre-op evaluation, timeout performed and anesthesia consent Position: Trendelenburg Lidocaine  1% used for infiltration and patient sedated Hand hygiene performed  and maximum sterile barriers used  Catheter size: 8.5 Fr Sheath introducer Procedure performed using ultrasound guided technique. Ultrasound Notes:anatomy identified, needle tip was noted to be adjacent to the nerve/plexus identified, no ultrasound evidence of intravascular and/or intraneural injection and image(s) printed for medical record Attempts: 1 Following insertion, line sutured, dressing applied and Biopatch. Post procedure assessment: blood return through all ports, free fluid flow and no air  Patient tolerated the procedure well with no immediate complications.

## 2023-11-16 NOTE — Progress Notes (Signed)
 Brief Progress Note  Patient seen in pre-op this morning.  Didn't sleep well overnight due to anxiety about upcoming surgery but denies chest pain and is otherwise doing well.    Vitals:   11/16/23 0548 11/16/23 0632  BP: (!) 153/84 (!) 160/91  Pulse: 65 64  Resp: 19 17  Temp: 97.9 F (36.6 C) 98.8 F (37.1 C)  SpO2: 96% 96%   Physical exam: General - Resting comfortably in bed CV - RRR Resp- Unlabored on nasal cannula Abd - Soft, NT/ND Ext - No edema  A/P: Mr. Disano is a 68 year old man, admitted after elective LHC which demonstrated high grade left main stenosis.  Risks and benefits of CABG were discussed and outline in previous consult note.  The patient is in agreement to proceed with surgery today.  Con [name], MD Cardiothoracic Surgery Pager: [phone]    [address] [place] [address]             [phone]

## 2023-11-16 NOTE — Discharge Summary (Incomplete)
 [address] [place] [address]             [phone]        Physician Discharge Summary  Patient ID: Anthony Boone MRN: [id number] DOB/AGE: 1955/04/18 68 y.o.  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   Paroxysmal atrial fibrillation (HCC) 11/20/2023   3-vessel CAD 11/20/2023   Coronary artery disease 11/16/2023   Intermediate coronary syndrome (HCC) 11/15/2023   Coronary artery calcification seen on CAT scan 10/09/2023   Former smoker 01/15/2018   Obesity (BMI 30.0-34.9) 01/12/2018   Erectile dysfunction 01/12/2018   Other abnormal glucose 07/29/2013   Vitamin D  deficiency 07/29/2013   Medication management 07/29/2013   Hyperlipidemia    Hypertension      Discharge Diagnoses:  Patient Active Problem List   Diagnosis Date Noted   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/2023   Paroxysmal atrial fibrillation (HCC) 11/20/2023   3-vessel CAD 11/20/2023   Coronary artery disease 11/16/2023   Intermediate coronary syndrome (HCC) 11/15/2023   Coronary artery calcification seen on CAT scan 10/09/2023   Former smoker 01/15/2018   Obesity (BMI 30.0-34.9) 01/12/2018   Erectile dysfunction 01/12/2018   Other abnormal glucose 07/29/2013   Vitamin D  deficiency 07/29/2013   Medication management 07/29/2013   Hyperlipidemia    Hypertension      Discharged Condition: Stable  HPI: The patient was recently evaluated by cardiology following a recent trauma with pneumothorax at which time he was found to have an incidental finding of coronary artery calcifications on a CT scan.  On evaluation, he was noted to been having symptoms of exertional anginal discomfort usually when walking on the treadmill..  Additionally the trauma was reportedly related to an exertional syncopal event but he relates it more to a slip and fall.  Cardiology evaluation did reveal an  elevated LP(a) of 110 and subsequently a cardiac CTA was performed.  He was found to have findings consistent with multivessel coronary artery disease and coronary calcium  score was significantly elevated at 1319.  FFR study showed reduced FFR in the LAD and mid right coronary artery and subsequently it was felt that he should undergo cardiac catheterization.  This was performed on today's date and he is found to have significant left main as well as significant three-vessel coronary artery disease.  LVEDP was noted to be 23 mmHg.  Full report is as described below.  An echocardiogram has been scheduled but results are currently pending.  Significant cardiac risk factors include hyperlipidemia, erectile dysfunction, obesity hypertension and tobacco abuse.   Dr. Daniel discussed the need for coronary artery bypass grafting surgery. Potential risks, benefits, and complications of the surgery were discussed with the patient and he agreed to proceed with surgery.  Hospital Course: Patient underwent a CABG x 3. He was transported from the OR to [facility] ICU. He was extubated the evening of surgery without complication. Drips were weaned as hemodynamics tolerated, swan ganz catheter and arterial line was removed on POD1. He was started on Lopressor  and routinely diuresed. He developed short runs of controlled rate atrial fibrillation. IV Amiodarone  protocol was started. He converted to NSR, IV Amiodarone  was transitioned to PO Amiodarone . He developed expected thrombocytopenia and was transfused with 1U FFP. Platelets were up to 115,000 on post op day 4. He was above his  pre op weight so he was diuresed with Lasix . He continued to maintain sinus rhythm. Lopressor  was increased to 25 mg bid for better HR/BP control. He was felt stable for transfer from the ICU to 4E for further convalescence on 09/08. He has been tolerating a diet and has had bowel movements. He has been ambulating on room air with good oxygenation. Sternal and  RLE wounds are clean, dry, healing without signs of infection.  As discussed with Dr. Daniel, he is felt stable for discharge today.  Consults: pulmonary/intensive care  Significant Diagnostic Studies:  Narrative & Impression  EXAM: 2 VIEW(S) XRAY OF THE CHEST 11/20/2023 02:05:00 PM   COMPARISON: 11/20/2023   CLINICAL HISTORY: Status post cardiac surgery [id number]. Reason for Exam: STATUS POST CARDIAC SURGERY   FINDINGS:   LUNGS AND PLEURA: Mild streaky atelectasis in left lung base, improved. Small left pleural effusion, unchanged. Trace right pleural effusion.   HEART AND MEDIASTINUM: Post-CABG changes noted.   BONES AND SOFT TISSUES: Intact sternotomy wires.   IMPRESSION: 1. Small left pleural effusion, unchanged. 2. Mild streaky atelectasis in left lung base, improved. 3. Trace right pleural effusion.   Electronically signed by: Donnice Mania MD 11/20/2023 05:57 PM EDT RP Workstation: [id number]    Treatments: surgery:  1. CABG x 3 (SVG - OM, SVG - Ramus, LIMA - LAD) 2. Endoscopic Vein Harvest by Dr. Daniel on 11/16/2023  Discharge Exam: Blood pressure (!) 142/89, pulse 81, temperature 98.3 F (36.8 C), temperature source Oral, resp. rate 20, height 5' 8 (1.727 m), weight 99.2 kg, SpO2 94%.  Cardiovascular: RRR Pulmonary: Clear to auscultation bilaterally Abdomen: Soft, non tender, bowel sounds present. Extremities: Mild bilateral lower extremity edema R>L Wounds: Sternal and RLE wounds are clean and dry.  No erythema or signs of infection.     Discharge Medications:  The patient has been discharged on:   1.Beta Blocker:  Yes [  x ]                              No   [   ]                              If No, reason:  2.Ace Inhibitor/ARB: Yes [  x ]                                     No  [    ]                                     If No, reason:  3.Statin:   Yes [ x  ]                  No  [   ]                  If No, reason:  4.Ecasa:  Yes  [  x ]                   No   [   ]                  If No, reason:  Patient had ACS upon  admission:  Plavix/P2Y12 inhibitor: Yes [   ]                                      No  [  x ]     Discharge Instructions     Amb Referral to Cardiac Rehabilitation   Complete by: As directed    To [place]   Diagnosis: CABG   CABG X ___: 3   After initial evaluation and assessments completed: Virtual Based Care may be provided alone or in conjunction with Phase 2 Cardiac Rehab based on patient barriers.: Yes   Intensive Cardiac Rehabilitation (ICR) [facility] location only OR Traditional Cardiac Rehabilitation (TCR) *If criteria for ICR are not met will enroll in TCR ([facility] only): Yes      Allergies as of 11/21/2023   No Known Allergies      Medication List     STOP taking these medications    bisoprolol -hydrochlorothiazide  10-6.25 MG tablet Commonly known as: ZIAC        TAKE these medications    amiodarone  400 MG tablet Commonly known as: PACERONE  Take 400 mg bid for 2 days;then take 200 mg bid for 7 days;then take 200 mg daily thereafter   aspirin  EC 325 MG tablet Take 1 tablet (325 mg total) by mouth daily. What changed:  medication strength how much to take how to take this when to take this additional instructions   furosemide  40 MG tablet Commonly known as: LASIX  Take 1 tablet (40 mg total) by mouth daily. For 3 days then stop   lisinopril  20 MG tablet Commonly known as: ZESTRIL  Take  1 tablet   Daily   for BP Start taking on: November 25, 2023 What changed:  medication strength These instructions start on November 25, 2023. If you are unsure what to do until then, ask your doctor or other care provider.   metoprolol  tartrate 25 MG tablet Commonly known as: LOPRESSOR  Take 1 tablet (25 mg total) by mouth 2 (two) times daily.   potassium chloride  SA 20 MEQ tablet Commonly known as: KLOR-CON  M Take 1 tablet (20 mEq total) by mouth daily. For 3 days then stop    rosuvastatin  20 MG tablet Commonly known as: CRESTOR  Take 1 tablet (20 mg total) by mouth daily.   sildenafil  50 MG tablet Commonly known as: Viagra  Take 1 tablet (50 mg total) by mouth as needed for erectile dysfunction. Take 1 hour before sexual intercourse.   traMADol  50 MG tablet Commonly known as: ULTRAM  Take 1 tablet (50 mg total) by mouth every 6 (six) hours as needed for moderate pain (pain score 4-6).        Follow-up Information     Su, Con RAMAN, MD. Go on 11/30/2023.   Specialty: Cardiothoracic Surgery Why: Please arrive by 12:00 pm in order to have a PA/LAT CXR taken PRIOR to office appointment with Dr. Daniel. CXR is located on the second floor of same builidng as her office. Office appointment time is at 1:00 pm Contact information: [address] [place] [address] [phone]         Carlin Delon BROCKS, NP. Go on 12/07/2023.   Specialty: Cardiology Why: Ap pointment time is at 8:25 am Contact information: [address] [place] [address] [phone]                 Signed:  Kyla HERO  Dwan, PA-C 11/21/2023, 7:40 AM

## 2023-11-16 NOTE — Progress Notes (Signed)
   [address] [place] [address]             [phone]    Intubated, starting to wake up  BP (!) 82/57   Pulse 71   Temp 97.7 F (36.5 C)   Resp 18   Ht 5' 8 (1.727 m)   Wt 97.5 kg   SpO2 100%   BMI 32.69 kg/m  Bp now 110/58 on norepi at 5   Intake/Output Summary (Last 24 hours) at 11/16/2023 1731 Last data filed at 11/16/2023 1700 Gross per 24 hour  Intake 4847.05 ml  Output 3775 ml  Net 1072.05 ml   Minimal CT output  Hct 35, PLT 88K [place] K 5.3  Doing well early postop PLT transfusion for thrombocytopenia  Elspeth C. Kerrin, MD [facility] [phone]

## 2023-11-16 NOTE — Anesthesia Procedure Notes (Signed)
 Arterial Line Insertion Start/End9/06/2023 7:00 AM, 11/16/2023 7:05 AM Performed by: Darlyn Rush, MD, Abryanna Musolino Laren, CRNA, CRNA  Patient location: OOR procedure area. Preanesthetic checklist: patient identified, IV checked, site marked, risks and benefits discussed, surgical consent, monitors and equipment checked, pre-op evaluation, timeout performed and anesthesia consent Lidocaine  1% used for infiltration and patient sedated Left, radial was placed Catheter size: 20 G Hand hygiene performed  Allen's test indicative of satisfactory collateral circulation Attempts: 1 Procedure performed without using ultrasound guided technique. Following insertion, dressing applied and Biopatch. Post procedure assessment: normal and unchanged  Patient tolerated the procedure well with no immediate complications.

## 2023-11-16 NOTE — Anesthesia Postprocedure Evaluation (Signed)
 Anesthesia Post Note  Patient: HARU SHAFF  Procedure(s) Performed: CORONARY ARTERY BYPASS GRAFTING (CABG) TIMES THREE USING LEFT INTERNAL MAMMARY ARTERY AND ENDOSCOPICALLY HARVESTED RIGHT GREATER SAPHENOUS VEIN (Chest) ECHOCARDIOGRAM, TRANSESOPHAGEAL, INTRAOPERATIVE     Patient location during evaluation: SICU Anesthesia Type: General Level of consciousness: sedated and patient remains intubated per anesthesia plan Pain management: pain level controlled Vital Signs Assessment: post-procedure vital signs reviewed and stable Respiratory status: patient remains intubated per anesthesia plan Cardiovascular status: stable Anesthetic complications: no   No notable events documented.  Last Vitals:  Vitals:   11/16/23 2115 11/16/23 2130  BP:    Pulse: 81 82  Resp: 14 13  Temp: 36.5 C (!) 36.4 C  SpO2: 100% 100%    Last Pain:  Vitals:   11/16/23 2117  TempSrc:   PainSc: 4                  Norleen Pope

## 2023-11-17 ENCOUNTER — Encounter (HOSPITAL_COMMUNITY): Payer: Self-pay

## 2023-11-17 ENCOUNTER — Inpatient Hospital Stay (HOSPITAL_COMMUNITY)

## 2023-11-17 DIAGNOSIS — I25118 Atherosclerotic heart disease of native coronary artery with other forms of angina pectoris: Secondary | ICD-10-CM | POA: Diagnosis not present

## 2023-11-17 DIAGNOSIS — D696 Thrombocytopenia, unspecified: Secondary | ICD-10-CM | POA: Diagnosis not present

## 2023-11-17 LAB — CBC
HCT: 29.9 % — ABNORMAL LOW (ref 39.0–52.0)
HCT: 30.6 % — ABNORMAL LOW (ref 39.0–52.0)
Hemoglobin: 10.3 g/dL — ABNORMAL LOW (ref 13.0–17.0)
Hemoglobin: 10.3 g/dL — ABNORMAL LOW (ref 13.0–17.0)
MCH: 30.3 pg (ref 26.0–34.0)
MCH: 30.5 pg (ref 26.0–34.0)
MCHC: 34.4 g/dL (ref 30.0–36.0)
MCHC: 33.7 g/dL (ref 30.0–36.0)
MCV: 87.9 fL (ref 80.0–100.0)
MCV: 90.5 fL (ref 80.0–100.0)
Platelets: 106 K/uL — ABNORMAL LOW (ref 150–400)
Platelets: 104 K/uL — ABNORMAL LOW (ref 150–400)
RBC: 3.4 MIL/uL — ABNORMAL LOW (ref 4.22–5.81)
RBC: 3.38 MIL/uL — ABNORMAL LOW (ref 4.22–5.81)
RDW: 13.4 % (ref 11.5–15.5)
RDW: 13.8 % (ref 11.5–15.5)
WBC: 7.5 K/uL (ref 4.0–10.5)
WBC: 8.5 K/uL (ref 4.0–10.5)
nRBC: 0 % (ref 0.0–0.2)
nRBC: 0 % (ref 0.0–0.2)

## 2023-11-17 LAB — PREPARE PLATELET PHERESIS: Unit division: 0

## 2023-11-17 LAB — GLUCOSE, CAPILLARY
Glucose-Capillary: 103 mg/dL — ABNORMAL HIGH (ref 70–99)
Glucose-Capillary: 128 mg/dL — ABNORMAL HIGH (ref 70–99)
Glucose-Capillary: 129 mg/dL — ABNORMAL HIGH (ref 70–99)
Glucose-Capillary: 133 mg/dL — ABNORMAL HIGH (ref 70–99)
Glucose-Capillary: 134 mg/dL — ABNORMAL HIGH (ref 70–99)
Glucose-Capillary: 141 mg/dL — ABNORMAL HIGH (ref 70–99)
Glucose-Capillary: 147 mg/dL — ABNORMAL HIGH (ref 70–99)
Glucose-Capillary: 152 mg/dL — ABNORMAL HIGH (ref 70–99)
Glucose-Capillary: 156 mg/dL — ABNORMAL HIGH (ref 70–99)
Glucose-Capillary: 197 mg/dL — ABNORMAL HIGH (ref 70–99)
Glucose-Capillary: 91 mg/dL (ref 70–99)

## 2023-11-17 LAB — BPAM PLATELET PHERESIS
Blood Product Expiration Date: 202509052359
ISSUE DATE / TIME: 202509041345
Unit Type and Rh: 600

## 2023-11-17 LAB — BASIC METABOLIC PANEL WITH GFR
Anion gap: 5 (ref 5–15)
Anion gap: 10 (ref 5–15)
BUN: 15 mg/dL (ref 8–23)
BUN: 21 mg/dL (ref 8–23)
CO2: 25 mmol/L (ref 22–32)
CO2: 25 mmol/L (ref 22–32)
Calcium: 7.5 mg/dL — ABNORMAL LOW (ref 8.9–10.3)
Calcium: 7.7 mg/dL — ABNORMAL LOW (ref 8.9–10.3)
Chloride: 108 mmol/L (ref 98–111)
Chloride: 101 mmol/L (ref 98–111)
Creatinine, Ser: 1.24 mg/dL (ref 0.61–1.24)
Creatinine, Ser: 1.51 mg/dL — ABNORMAL HIGH (ref 0.61–1.24)
GFR, Estimated: >60 mL/min (ref >60)
GFR, Estimated: 50 mL/min — ABNORMAL LOW (ref >60)
Glucose, Bld: 148 mg/dL — ABNORMAL HIGH (ref 70–99)
Glucose, Bld: 121 mg/dL — ABNORMAL HIGH (ref 70–99)
Potassium: 4.5 mmol/L (ref 3.5–5.1)
Potassium: 4.4 mmol/L (ref 3.5–5.1)
Sodium: 138 mmol/L (ref 135–145)
Sodium: 136 mmol/L (ref 135–145)

## 2023-11-17 LAB — MAGNESIUM
Magnesium: 2.9 mg/dL — ABNORMAL HIGH (ref 1.7–2.4)
Magnesium: 3 mg/dL — ABNORMAL HIGH (ref 1.7–2.4)

## 2023-11-17 MED ORDER — POLYVINYL ALCOHOL 1.4 % OP SOLN
1.0000 [drp] | OPHTHALMIC | Status: DC | PRN
  Administered 2023-11-17 (×2): 1 [drp] via OPHTHALMIC
  Filled 2023-11-17: qty 15

## 2023-11-17 MED ORDER — INSULIN ASPART 100 UNIT/ML IJ SOLN
0.0000 [IU] | INTRAMUSCULAR | Status: DC
  Administered 2023-11-17 (×3): 2 [IU] via SUBCUTANEOUS

## 2023-11-17 MED ORDER — CARMEX CLASSIC LIP BALM EX OINT
1.0000 | TOPICAL_OINTMENT | CUTANEOUS | Status: DC | PRN
  Filled 2023-11-17: qty 10

## 2023-11-17 NOTE — Progress Notes (Signed)
 Patient ID: Anthony Boone, male   DOB: 1955/06/29, 68 y.o.   MRN: [id number]  TCTS Evening Rounds:  Hemodynamically stable in sinus rhythm. SBP mid 90's to low 100's Sats 96% 4L.  UO ok  CT output low.  CBC    Component Value Date/Time   WBC 8.5 11/17/2023 1617   RBC 3.38 (L) 11/17/2023 1617   HGB 10.3 (L) 11/17/2023 1617   HGB 15.7 11/10/2023 1041   HCT 30.6 (L) 11/17/2023 1617   HCT 46.6 11/10/2023 1041   PLT 104 (L) 11/17/2023 1617   PLT 137 (L) 11/10/2023 1041   MCV 90.5 11/17/2023 1617   MCV 90 11/10/2023 1041   MCH 30.5 11/17/2023 1617   MCHC 33.7 11/17/2023 1617   RDW 13.8 11/17/2023 1617   RDW 13.2 11/10/2023 1041   LYMPHSABS 1.9 10/09/2023 1546   MONOABS 0.5 10/09/2023 1546   EOSABS 0.1 10/09/2023 1546   BASOSABS 0.0 10/09/2023 1546   BMET pending.

## 2023-11-17 NOTE — Progress Notes (Signed)
 NAME:  Anthony Boone, MRN:  [id number], DOB:  [birth year], LOS: 2 ADMISSION DATE:  11/15/2023, CONSULTATION DATE: 9//2025 REFERRING MD:  Daniel Benders, CHIEF COMPLAINT: Status post CABG  Thank you yeah  History of Present Illness:  68 year old male with hypertension, hyperlipidemia and obesity who was worked up for exertional syncope, noted to have multivessel coronary artery disease, underwent CABG x 3 Patient remained intubated and was transferred to ICU, PCCM was consulted for help evaluation medical management  Pertinent  Medical History   Past Medical History:  Diagnosis Date   Coronary artery calcification seen on CAT scan 10/09/2023   Erectile dysfunction 01/12/2018   Former smoker 01/15/2018   Hyperlipidemia    Hypertension    Medication management 07/29/2013   Obesity (BMI 30.0-34.9) 01/12/2018   Other abnormal glucose 07/29/2013   Vitamin D  deficiency     Significant Hospital Events: Including procedures, antibiotic start and stop dates in addition to other pertinent events   9/4 CABG x 3, extubated  Interim History / Subjective:  Patient was successfully extubated yesterday, remained on 2 L nasal cannula oxygen Came off of vasopressor support Continue to complain of surgical site pain Afebrile  Objective    Blood pressure 102/70, pulse 91, temperature (!) 97.5 F (36.4 C), resp. rate (!) 21, height 5' 8 (1.727 m), weight 104 kg, SpO2 96%. PAP: (20-59)/(5-26) 29/18 CVP:  [1 mmHg-21 mmHg] 9 mmHg PCWP:  [20 mmHg] 20 mmHg CO:  [3.6 L/min-5.6 L/min] 5.6 L/min CI:  [1.7 L/min/m2-2.7 L/min/m2] 2.7 L/min/m2  Vent Mode: PSV;CPAP FiO2 (%):  [36 %-50 %] 36 % Set Rate:  [4 bmp-16 bmp] 4 bmp Vt Set:  [540 mL] 540 mL PEEP:  [5 cmH20] 5 cmH20 Pressure Support:  [10 cmH20] 10 cmH20   Intake/Output Summary (Last 24 hours) at 11/17/2023 0925 Last data filed at 11/17/2023 0900 Gross per 24 hour  Intake 5911.44 ml  Output 5055 ml  Net 856.44 ml   Filed Weights   11/15/23  1600 11/16/23 0632 11/17/23 0600  Weight: 97.5 kg 97.5 kg 104 kg    Examination: General: Elderly obese male, lying on the bed HEENT: [place]/AT, eyes anicteric.  moist mucus membranes Neuro: Alert, awake following commands Chest: Central sternotomy incision looks clean and dry, coarse breath sounds, no wheezes or rhonchi.  Mediastinal, chest tube and pacer wires in place Heart: Regular rate and rhythm, no murmurs or gallops Abdomen: Soft, nontender, nondistended, bowel sounds present  Patient Lines/Drains/Airways Status     Active Line/Drains/Airways     Name Placement date Placement time Site Days   Arterial Line 11/16/23 Left Radial 11/16/23  0700  Radial  1   Peripheral IV 11/15/23 20 G 1.88 Left;Anterior Forearm 11/15/23  2301  Forearm  2   Peripheral IV 11/15/23 20 G 2.5 Left;Anterior;Upper Arm 11/15/23  2302  Arm  2   Peripheral IV 11/16/23 18 G Right Wrist 11/16/23  0650  Wrist  1   Urethral Catheter D. Acuna RN Latex;Straight-tip;Temperature probe 16 Fr. 11/16/23  0755  Latex;Straight-tip;Temperature probe  1   Y Chest Tube 1, 2, and 3 1 Medial Mediastinal 28 Fr. 2 Medial Mediastinal 36 Fr. 3 Left Pleural 28 Fr. 11/16/23  1321  -- 1   Wound 11/16/23 0920 Surgical Closed Surgical Incision Chest 11/16/23  0920  Chest  1   Wound 11/16/23 0920 Surgical Closed Surgical Incision Leg Right 11/16/23  0920  Leg  1         Resolved problem  list    Assessment and Plan  Multivessel coronary artery disease s/p CABG x 3 Continue aspirin  and statin Started on low-dose metoprolol  Chest tube management TCTS Chest tube output was 800 cc in last 24 hours Continue pain control with tramadol , oxycodone  and morphine  Closely monitor chest tube output  Acute respiratory insufficiency, postop Patient was successfully extubated yesterday per rapid weaning protocol Encourage incentive spirometry and ambulation  Hypertension Continue metoprolol   Hyperlipidemia Continue  atorvastatin  Expected perioperative blood loss anemia Thrombocytopenia due to CPB Monitor H/H and PLT counts Received 1 unit platelets yesterday  Obesity Diet and exercise counseling as appropriate   Labs   CBC: Recent Labs  Lab 11/10/23 1041 11/16/23 0604 11/16/23 0756 11/16/23 1133 11/16/23 1149 11/16/23 1443 11/16/23 1445 11/16/23 1836 11/16/23 2022 11/16/23 2103 11/17/23 0513  WBC 5.8 5.8  --   --   --  7.7  --   --   --  7.8 7.5  HGB 15.7 15.7   < > 10.3*   < > 12.0* 11.2* 9.9* 9.9* 11.3* 10.3*  HCT 46.6 44.3   < > 30.0*   < > 34.6* 33.0* 29.0* 29.0* 32.6* 29.9*  MCV 90 85.4  --   --   --  86.7  --   --   --  87.4 87.9  PLT 137* 123*  --  130*  --  88*  --   --   --  111* 106*   < > = values in this interval not displayed.    Basic Metabolic Panel: Recent Labs  Lab 11/10/23 1041 11/16/23 0604 11/16/23 0756 11/16/23 1149 11/16/23 1206 11/16/23 1315 11/16/23 1318 11/16/23 1445 11/16/23 1836 11/16/23 2022 11/16/23 2103 11/17/23 0513  NA 140 138   < > 132* 135   < > 136 139 140 140 140 138  K 4.7 4.4   < > 6.4* 6.1*   < > 5.3* 5.1 5.0 4.8 4.5 4.5  CL 102 103   < > 99 100  --  102  --   --   --  106 108  CO2 23 24  --   --   --   --   --   --   --   --  24 25  GLUCOSE 96 107*   < > 141* 140*  --  160*  --   --   --  146* 148*  BUN 16 13   < > 14 14  --  14  --   --   --  14 15  CREATININE 1.09 1.01   < > 1.00 0.90  --  1.00  --   --   --  1.15 1.24  CALCIUM  9.0 9.1  --   --   --   --   --   --   --   --  7.4* 7.5*  MG  --   --   --   --   --   --   --   --   --   --  3.2* 2.9*   < > = values in this interval not displayed.   GFR: Estimated Creatinine Clearance: 67.5 mL/min (by C-G formula based on SCr of 1.24 mg/dL). Recent Labs  Lab 11/16/23 0604 11/16/23 1443 11/16/23 2103 11/17/23 0513  WBC 5.8 7.7 7.8 7.5    Liver Function Tests: No results for input(s): AST, ALT, ALKPHOS, BILITOT, PROT, ALBUMIN  in the last 168 hours. No  results for  input(s): LIPASE, AMYLASE in the last 168 hours. No results for input(s): AMMONIA in the last 168 hours.  ABG    Component Value Date/Time   PHART 7.360 11/16/2023 2022   PCO2ART 44.5 11/16/2023 2022   PO2ART 85 11/16/2023 2022   HCO3 25.3 11/16/2023 2022   TCO2 27 11/16/2023 2022   ACIDBASEDEF 2.0 11/16/2023 1445   O2SAT 96 11/16/2023 2022     Coagulation Profile: Recent Labs  Lab 11/16/23 1443  INR 1.4*    Cardiac Enzymes: No results for input(s): CKTOTAL, CKMB, CKMBINDEX, TROPONINI in the last 168 hours.  HbA1C: Hgb A1c MFr Bld  Date/Time Value Ref Range Status  11/16/2023 06:04 AM 5.2 4.8 - 5.6 % Final    Comment:    (NOTE) Diagnosis of Diabetes The following HbA1c ranges recommended by the American Diabetes Association (ADA) may be used as an aid in the diagnosis of diabetes mellitus.  Hemoglobin             Suggested A1C NGSP%              Diagnosis  <5.7                   Non Diabetic  5.7-6.4                Pre-Diabetic  >6.4                   Diabetic  <7.0                   Glycemic control for                       adults with diabetes.    12/23/2022 11:17 AM 5.2 <5.7 % of total Hgb Final    Comment:    For the purpose of screening for the presence of diabetes: . <5.7%       Consistent with the absence of diabetes 5.7-6.4%    Consistent with increased risk for diabetes             (prediabetes) > or =6.5%  Consistent with diabetes . This assay result is consistent with a decreased risk of diabetes. . Currently, no consensus exists regarding use of hemoglobin A1c for diagnosis of diabetes in children. . According to American Diabetes Association (ADA) guidelines, hemoglobin A1c <7.0% represents optimal control in non-pregnant diabetic patients. Different metrics may apply to specific patient populations.  Standards of Medical Care in Diabetes(ADA). .     CBG: Recent Labs  Lab 11/17/23 0327 11/17/23 0511  11/17/23 0603 11/17/23 0705 11/17/23 0905  GLUCAP 141* 147* 133* 134* 103*     Valinda Novas, MD [facility] See Amion for pager If no response to pager, please call [phone] until 7pm After 7pm, Please call E-link [phone]

## 2023-11-17 NOTE — Progress Notes (Signed)
 1 Day Post-Op Procedure(s) (LRB): CORONARY ARTERY BYPASS GRAFTING (CABG) TIMES THREE USING LEFT INTERNAL MAMMARY ARTERY AND ENDOSCOPICALLY HARVESTED RIGHT GREATER SAPHENOUS VEIN (N/A) ECHOCARDIOGRAM, TRANSESOPHAGEAL, INTRAOPERATIVE (N/A) Subjective: Extubated overnight, doing well Off levo this morning Pain is well-controlled, mainly concerned with left eye irritation Stood this morning without orthostasis  Vital signs in last 24 hours: Temp:  [96.6 F (35.9 C)-98.4 F (36.9 C)] 97.9 F (36.6 C) (09/05 1100) Pulse Rate:  [68-99] 84 (09/05 1200) Cardiac Rhythm: Normal sinus rhythm (09/05 1200) Resp:  [7-29] 18 (09/05 1200) BP: (82-123)/(57-77) 103/67 (09/05 1200) SpO2:  [79 %-100 %] 100 % (09/05 1200) Arterial Line BP: (65-147)/(27-77) 128/57 (09/05 1100) FiO2 (%):  [36 %-50 %] 36 % (09/04 1841) Weight:  [104 kg] 104 kg (09/05 0600)  Hemodynamic parameters for last 24 hours: PAP: (20-59)/(5-26) 29/18 CVP:  [1 mmHg-21 mmHg] 9 mmHg PCWP:  [20 mmHg] 20 mmHg CO:  [3.6 L/min-5.6 L/min] 5.6 L/min CI:  [1.7 L/min/m2-2.7 L/min/m2] 2.7 L/min/m2  Intake/Output from previous day: 09/04 0701 - 09/05 0700 In: 6345 [P.O.:240; I.V.:2936.1; Blood:993.7; IV Piggyback:2175.3] Out: 5255 [Urine:2935; Emesis/NG output:20; Blood:1500; Chest Tube:800] Intake/Output this shift: Total I/O In: 61.5 [I.V.:61.5] Out: 255 [Urine:155; Chest Tube:100]  Physical Exam: General - Resting comfortably in bed CV - RRR Resp - Unlabored on nasal cannula Abd - Soft, ND/NT Ext - mild edema  Lab Results: Recent Labs    11/16/23 2103 11/17/23 0513  WBC 7.8 7.5  HGB 11.3* 10.3*  HCT 32.6* 29.9*  PLT 111* 106*   BMET:  Recent Labs    11/16/23 2103 11/17/23 0513  NA 140 138  K 4.5 4.5  CL 106 108  CO2 24 25  GLUCOSE 146* 148*  BUN 14 15  CREATININE 1.15 1.24  CALCIUM  7.4* 7.5*    PT/INR:  Recent Labs    11/16/23 1443  LABPROT 18.2*  INR 1.4*   ABG    Component Value Date/Time   PHART  7.360 11/16/2023 2022   HCO3 25.3 11/16/2023 2022   TCO2 27 11/16/2023 2022   ACIDBASEDEF 2.0 11/16/2023 1445   O2SAT 96 11/16/2023 2022   CBG (last 3)  Recent Labs    11/17/23 0705 11/17/23 0905 11/17/23 1126  GLUCAP 134* 103* 128*    Assessment/Plan: S/P Procedure(s) (LRB): CORONARY ARTERY BYPASS GRAFTING (CABG) TIMES THREE USING LEFT INTERNAL MAMMARY ARTERY AND ENDOSCOPICALLY HARVESTED RIGHT GREATER SAPHENOUS VEIN (N/A) ECHOCARDIOGRAM, TRANSESOPHAGEAL, INTRAOPERATIVE (N/A) - Transition to q4 hr insulin  - Cap wires, keep all tubes, foley and central line - OK to remove A line if BP stable with ambulation - Advance to carb control diet - Remove swan - Potentially diurese in PM if BP tolerates beta blocker - Start metop 12.5/12    LOS: 2 days    Con RAMAN Charlett Merkle 11/17/2023

## 2023-11-17 NOTE — Progress Notes (Signed)
 Patient was seen by me socially, appears to be progressing well.  Patient is thankful for the care he has received so far and is motivated to make lifestyle changes as well.     Gordy Bergamo, MD, [facility] 11/17/2023, 9:12 AM [facility] [address] [place], [place] [address] Phone: [phone]. Fax:  [phone]

## 2023-11-17 NOTE — TOC Initial Note (Signed)
 Transition of Care [facility]) - Initial/Assessment Note    Patient Details  Name: Anthony Boone MRN: [id number] Date of Birth: [birth year]  [facility] CM/SW Contact:    Sudie Erminio Deems, RN Phone Number: 11/17/2023, 3:43 PM  Clinical Narrative:  Patient POD-1 CABG. PTA patient was independent from home with spouse. Patient does not use any DME in the home. Patient has PCP and gets to appointments without any issues. [facility] is following the patient with TCTS office protocol referral for [facility]. Case Manager will continue to follow for additional needs as the patient progresses.                  Expected Discharge Plan: Home w Home Health Services Barriers to Discharge: Continued Medical Work up   Patient Goals and CMS Choice Patient states their goals for this hospitalization and ongoing recovery are:: plan to transition home once stable.   Choice offered to / list presented to : NA      Expected Discharge Plan and Services In-house Referral: NA Discharge Planning Services: CM Consult Post Acute Care Choice: Home Health Living arrangements for the past 2 months: Single Family Home                   DME Agency: NA                  Prior Living Arrangements/Services Living arrangements for the past 2 months: Single Family Home Lives with:: Spouse Patient language and need for interpreter reviewed:: Yes Do you feel safe going back to the place where you live?: Yes      Need for Family Participation in Patient Care: No (Comment) Care giver support system in place?: No (comment)   Criminal Activity/Legal Involvement Pertinent to Current Situation/Hospitalization: No - Comment as needed  Activities of Daily Living   ADL Screening (condition at time of admission) Independently performs ADLs?: Yes (appropriate for developmental age) Is the patient deaf or have difficulty hearing?: No Does the patient have difficulty seeing, even  when wearing glasses/contacts?: No Does the patient have difficulty concentrating, remembering, or making decisions?: No  Permission Sought/Granted Permission sought to share information with : Family Supports, Case Manager                Emotional Assessment       Orientation: : Oriented to Self Alcohol  / Substance Use: Not Applicable Psych Involvement: No (comment)  Admission diagnosis:  Intermediate coronary syndrome (HCC) [I20.0] Coronary artery disease [I25.10] Patient Active Problem List   Diagnosis Date Noted   Coronary artery disease 11/16/2023   Intermediate coronary syndrome (HCC) 11/15/2023   Coronary artery calcification seen on CAT scan 10/09/2023   Former smoker 01/15/2018   Obesity (BMI 30.0-34.9) 01/12/2018   Erectile dysfunction 01/12/2018   Other abnormal glucose 07/29/2013   Vitamin D  deficiency 07/29/2013   Medication management 07/29/2013   Hyperlipidemia    Hypertension    PCP:  Almarie Waddell NOVAK, NP Pharmacy:   [id number] - [place], [place] - [address] [facility] [place] [address] Phone: [phone] Fax: [phone]  [facility] [id number] [place], [place] - [address] [place] [address] Phone: [phone] Fax: [phone]     Social Drivers of Health (SDOH) Social History: SDOH Screenings   Food Insecurity: No Food Insecurity (11/15/2023)  Housing: Low Risk  (11/15/2023)  Transportation Needs: No Transportation Needs (11/15/2023)  Utilities: Not At  Risk (11/15/2023)  Depression (PHQ2-9): Low Risk  (10/09/2023)  Financial Resource Strain: Low Risk  (09/24/2023)   Received from [facility]  Physical Activity: Insufficiently Active (09/24/2023)   Received from [facility]  Social Connections: Socially Integrated (11/15/2023)  Stress: No Stress Concern Present (09/24/2023)   Received from [facility]  Recent Concern: Stress - Stress Concern Present (09/17/2023)    Received from [facility]  Tobacco Use: Medium Risk (11/16/2023)   SDOH Interventions:     Readmission Risk Interventions     No data to display

## 2023-11-18 ENCOUNTER — Inpatient Hospital Stay (HOSPITAL_COMMUNITY)

## 2023-11-18 DIAGNOSIS — N179 Acute kidney failure, unspecified: Secondary | ICD-10-CM

## 2023-11-18 DIAGNOSIS — D696 Thrombocytopenia, unspecified: Secondary | ICD-10-CM | POA: Diagnosis not present

## 2023-11-18 DIAGNOSIS — I25118 Atherosclerotic heart disease of native coronary artery with other forms of angina pectoris: Secondary | ICD-10-CM | POA: Diagnosis not present

## 2023-11-18 DIAGNOSIS — I4891 Unspecified atrial fibrillation: Secondary | ICD-10-CM | POA: Diagnosis not present

## 2023-11-18 DIAGNOSIS — Z951 Presence of aortocoronary bypass graft: Secondary | ICD-10-CM | POA: Diagnosis not present

## 2023-11-18 LAB — CBC
HCT: 28.7 % — ABNORMAL LOW (ref 39.0–52.0)
Hemoglobin: 9.5 g/dL — ABNORMAL LOW (ref 13.0–17.0)
MCH: 30 pg (ref 26.0–34.0)
MCHC: 33.1 g/dL (ref 30.0–36.0)
MCV: 90.5 fL (ref 80.0–100.0)
Platelets: 107 K/uL — ABNORMAL LOW (ref 150–400)
RBC: 3.17 MIL/uL — ABNORMAL LOW (ref 4.22–5.81)
RDW: 13.7 % (ref 11.5–15.5)
WBC: 8.7 K/uL (ref 4.0–10.5)
nRBC: 0 % (ref 0.0–0.2)

## 2023-11-18 LAB — BASIC METABOLIC PANEL WITH GFR
Anion gap: 11 (ref 5–15)
BUN: 26 mg/dL — ABNORMAL HIGH (ref 8–23)
CO2: 25 mmol/L (ref 22–32)
Calcium: 7.6 mg/dL — ABNORMAL LOW (ref 8.9–10.3)
Chloride: 102 mmol/L (ref 98–111)
Creatinine, Ser: 1.49 mg/dL — ABNORMAL HIGH (ref 0.61–1.24)
GFR, Estimated: 51 mL/min — ABNORMAL LOW (ref >60)
Glucose, Bld: 120 mg/dL — ABNORMAL HIGH (ref 70–99)
Potassium: 4.7 mmol/L (ref 3.5–5.1)
Sodium: 138 mmol/L (ref 135–145)

## 2023-11-18 LAB — GLUCOSE, CAPILLARY
Glucose-Capillary: 113 mg/dL — ABNORMAL HIGH (ref 70–99)
Glucose-Capillary: 119 mg/dL — ABNORMAL HIGH (ref 70–99)
Glucose-Capillary: 112 mg/dL — ABNORMAL HIGH (ref 70–99)
Glucose-Capillary: 113 mg/dL — ABNORMAL HIGH (ref 70–99)
Glucose-Capillary: 102 mg/dL — ABNORMAL HIGH (ref 70–99)

## 2023-11-18 MED ORDER — INSULIN ASPART 100 UNIT/ML IJ SOLN: 0.0000 [IU] | Freq: Three times a day (TID) | INTRAMUSCULAR | Status: DC

## 2023-11-18 MED ORDER — AMIODARONE LOAD VIA INFUSION
150.0000 mg | Freq: Once | INTRAVENOUS | Status: AC
  Administered 2023-11-18: 150 mg via INTRAVENOUS
  Filled 2023-11-18: qty 83.34

## 2023-11-18 MED ORDER — ORAL CARE MOUTH RINSE: 15.0000 mL | OROMUCOSAL | Status: DC | PRN

## 2023-11-18 MED ORDER — AMIODARONE HCL IN DEXTROSE 360-4.14 MG/200ML-% IV SOLN
60.0000 mg/h | INTRAVENOUS | Status: AC
  Administered 2023-11-18 (×2): 60 mg/h via INTRAVENOUS
  Filled 2023-11-18: qty 200

## 2023-11-18 MED ORDER — FUROSEMIDE 10 MG/ML IJ SOLN
20.0000 mg | Freq: Two times a day (BID) | INTRAMUSCULAR | Status: DC
  Administered 2023-11-18 – 2023-11-19 (×4): 20 mg via INTRAVENOUS
  Filled 2023-11-18 (×4): qty 2

## 2023-11-18 MED ORDER — AMIODARONE HCL IN DEXTROSE 360-4.14 MG/200ML-% IV SOLN
30.0000 mg/h | INTRAVENOUS | Status: DC
  Administered 2023-11-18 – 2023-11-19 (×2): 30 mg/h via INTRAVENOUS
  Filled 2023-11-18 (×2): qty 200

## 2023-11-18 NOTE — Progress Notes (Addendum)
 NAME:  MESHILEM MACHUCA, MRN:  [id number], DOB:  [birth year], LOS: 3 ADMISSION DATE:  11/15/2023, CONSULTATION DATE: 9//2025 REFERRING MD:  Daniel Benders, CHIEF COMPLAINT: Status post CABG  Thank you yeah  History of Present Illness:  68 year old male with hypertension, hyperlipidemia and obesity who was worked up for exertional syncope, noted to have multivessel coronary artery disease, underwent CABG x 3 Patient remained intubated and was transferred to ICU, PCCM was consulted for help evaluation medical management  Pertinent  Medical History   Past Medical History:  Diagnosis Date   Coronary artery calcification seen on CAT scan 10/09/2023   Erectile dysfunction 01/12/2018   Former smoker 01/15/2018   Hyperlipidemia    Hypertension    Medication management 07/29/2013   Obesity (BMI 30.0-34.9) 01/12/2018   Other abnormal glucose 07/29/2013   Vitamin D  deficiency     Significant Hospital Events: Including procedures, antibiotic start and stop dates in addition to other pertinent events   9/4 CABG x 3, extubated 9/5 came off of vasopressor support  Interim History / Subjective:  Patient going in and out of A-fib, received 2.5 mg of IV metoprolol  overnight, heart rate is controlled in the 80s Stated surgical site pain is controlled Denies shortness of breath or other complaints  Objective    Blood pressure (!) 94/56, pulse 92, temperature 98.2 F (36.8 C), temperature source Oral, resp. rate 16, height 5' 8 (1.727 m), weight 104.2 kg, SpO2 95%.        Intake/Output Summary (Last 24 hours) at 11/18/2023 0758 Last data filed at 11/18/2023 0600 Gross per 24 hour  Intake 738.36 ml  Output 1000 ml  Net -261.64 ml   Filed Weights   11/16/23 9367 11/17/23 0600 11/18/23 0500  Weight: 97.5 kg 104 kg 104.2 kg    Examination: General: Elderly male sitting on recliner HEENT: [place]/AT, eyes anicteric.  moist mucus membranes.  Right IJ central line in place Neuro: Alert, awake following  commands Chest: Central sternotomy incision looks clean and dry, coarse breath sounds, no wheezes or rhonchi.  Mediastinal, chest tube and pacer wire in place Heart: Irregularly irregular, no murmurs or gallops Abdomen: Soft, nontender, nondistended, bowel sounds present   Patient Lines/Drains/Airways Status     Active Line/Drains/Airways     Name Placement date Placement time Site Days   Peripheral IV 11/15/23 20 G 1.88 Left;Anterior Forearm 11/15/23  2301  Forearm  3   Peripheral IV 11/15/23 20 G 2.5 Left;Anterior;Upper Arm 11/15/23  2302  Arm  3   Peripheral IV 11/16/23 18 G Right Wrist 11/16/23  0650  Wrist  2   Urethral Catheter D. Acuna RN Latex;Straight-tip;Temperature probe 16 Fr. 11/16/23  0755  Latex;Straight-tip;Temperature probe  2   Y Chest Tube 1, 2, and 3 1 Medial Mediastinal 28 Fr. 2 Medial Mediastinal 36 Fr. 3 Left Pleural 28 Fr. 11/16/23  1321  -- 2   Wound 11/16/23 0920 Surgical Closed Surgical Incision Chest 11/16/23  0920  Chest  2   Wound 11/16/23 0920 Surgical Closed Surgical Incision Leg Right 11/16/23  0920  Leg  2        Resolved problem list    Assessment and Plan  Multivessel coronary artery disease s/p CABG x 3 Continue aspirin  and statin Continue metoprolol , currently on 12.5 mg twice daily Chest tube management TCTS Chest tube output was 290 cc in last 24 hours Surgical site pain is controlled Continue tramadol , oxycodone  and morphine  as needed Closely monitor chest tube output  Paroxysmal A-fib Patient is going in and out of A-fib but heart rate remained controlled between 80-90 He is on metoprolol  Will start amiodarone  with bolus and infusion  Acute respiratory insufficiency, postop Encourage incentive spirometry and ambulation Currently on 4 L nasal cannula oxygen with O2 sat in high 90s, titrate down with O2 sat goal 92%  Acute kidney injury Serum creatinine trended up to 1.5, with baseline serum creatinine of 1 Monitor intake and  output Avoid nephrotoxic agent Start Lasix  20 mg IV twice daily  Hypertension Continue metoprolol   Hyperlipidemia Continue Crestor   Expected perioperative blood loss anemia not clinically significant Thrombocytopenia due to CPB, clinically significant, status post 1 unit platelet transfusion H&H slightly trended down, still stable around 9.5, platelet counts are slowly improving Closely monitor  Obesity Diet and exercise counseling provided  Labs   CBC: Recent Labs  Lab 11/16/23 1443 11/16/23 1445 11/16/23 2022 11/16/23 2103 11/17/23 0513 11/17/23 1617 11/18/23 0400  WBC 7.7  --   --  7.8 7.5 8.5 8.7  HGB 12.0*   < > 9.9* 11.3* 10.3* 10.3* 9.5*  HCT 34.6*   < > 29.0* 32.6* 29.9* 30.6* 28.7*  MCV 86.7  --   --  87.4 87.9 90.5 90.5  PLT 88*  --   --  111* 106* 104* 107*   < > = values in this interval not displayed.    Basic Metabolic Panel: Recent Labs  Lab 11/16/23 0604 11/16/23 0756 11/16/23 1318 11/16/23 1445 11/16/23 2022 11/16/23 2103 11/17/23 0513 11/17/23 1617 11/18/23 0400  NA 138   < > 136   < > 140 140 138 136 138  K 4.4   < > 5.3*   < > 4.8 4.5 4.5 4.4 4.7  CL 103   < > 102  --   --  106 108 101 102  CO2 24  --   --   --   --  24 25 25 25   GLUCOSE 107*   < > 160*  --   --  146* 148* 121* 120*  BUN 13   < > 14  --   --  14 15 21  26*  CREATININE 1.01   < > 1.00  --   --  1.15 1.24 1.51* 1.49*  CALCIUM  9.1  --   --   --   --  7.4* 7.5* 7.7* 7.6*  MG  --   --   --   --   --  3.2* 2.9* 3.0*  --    < > = values in this interval not displayed.   GFR: Estimated Creatinine Clearance: 56.3 mL/min (A) (by C-G formula based on SCr of 1.49 mg/dL (H)). Recent Labs  Lab 11/16/23 2103 11/17/23 0513 11/17/23 1617 11/18/23 0400  WBC 7.8 7.5 8.5 8.7    Liver Function Tests: No results for input(s): AST, ALT, ALKPHOS, BILITOT, PROT, ALBUMIN  in the last 168 hours. No results for input(s): LIPASE, AMYLASE in the last 168 hours. No results  for input(s): AMMONIA in the last 168 hours.  ABG    Component Value Date/Time   PHART 7.360 11/16/2023 2022   PCO2ART 44.5 11/16/2023 2022   PO2ART 85 11/16/2023 2022   HCO3 25.3 11/16/2023 2022   TCO2 27 11/16/2023 2022   ACIDBASEDEF 2.0 11/16/2023 1445   O2SAT 96 11/16/2023 2022     Coagulation Profile: Recent Labs  Lab 11/16/23 1443  INR 1.4*    Cardiac Enzymes: No results for input(s): CKTOTAL, CKMB, CKMBINDEX,  TROPONINI in the last 168 hours.  HbA1C: Hgb A1c MFr Bld  Date/Time Value Ref Range Status  11/16/2023 06:04 AM 5.2 4.8 - 5.6 % Final    Comment:    (NOTE) Diagnosis of Diabetes The following HbA1c ranges recommended by the American Diabetes Association (ADA) may be used as an aid in the diagnosis of diabetes mellitus.  Hemoglobin             Suggested A1C NGSP%              Diagnosis  <5.7                   Non Diabetic  5.7-6.4                Pre-Diabetic  >6.4                   Diabetic  <7.0                   Glycemic control for                       adults with diabetes.    12/23/2022 11:17 AM 5.2 <5.7 % of total Hgb Final    Comment:    For the purpose of screening for the presence of diabetes: . <5.7%       Consistent with the absence of diabetes 5.7-6.4%    Consistent with increased risk for diabetes             (prediabetes) > or =6.5%  Consistent with diabetes . This assay result is consistent with a decreased risk of diabetes. . Currently, no consensus exists regarding use of hemoglobin A1c for diagnosis of diabetes in children. . According to American Diabetes Association (ADA) guidelines, hemoglobin A1c <7.0% represents optimal control in non-pregnant diabetic patients. Different metrics may apply to specific patient populations.  Standards of Medical Care in Diabetes(ADA). .     CBG: Recent Labs  Lab 11/17/23 1126 11/17/23 1600 11/17/23 2017 11/17/23 2325 11/18/23 0328  GLUCAP 128* 91 129* 152* 113*      Valinda Novas, MD  Pulmonary Critical Care See Amion for pager If no response to pager, please call [phone] until 7pm After 7pm, Please call E-link [phone]

## 2023-11-18 NOTE — Plan of Care (Signed)
  Problem: Education: Goal: Understanding of CV disease, CV risk reduction, and recovery process will improve Outcome: Progressing Goal: Individualized Educational Video(s) Outcome: Progressing   Problem: Activity: Goal: Ability to return to baseline activity level will improve Outcome: Progressing   Problem: Cardiovascular: Goal: Ability to achieve and maintain adequate cardiovascular perfusion will improve Outcome: Progressing Goal: Vascular access site(s) Level 0-1 will be maintained Outcome: Progressing   Problem: Health Behavior/Discharge Planning: Goal: Ability to safely manage health-related needs after discharge will improve Outcome: Progressing   Problem: Education: Goal: Knowledge of General Education information will improve Description: Including pain rating scale, medication(s)/side effects and non-pharmacologic comfort measures Outcome: Progressing   Problem: Health Behavior/Discharge Planning: Goal: Ability to manage health-related needs will improve Outcome: Progressing   Problem: Clinical Measurements: Goal: Ability to maintain clinical measurements within normal limits will improve Outcome: Progressing Goal: Will remain free from infection Outcome: Progressing Goal: Diagnostic test results will improve Outcome: Progressing Goal: Respiratory complications will improve Outcome: Progressing Goal: Cardiovascular complication will be avoided Outcome: Progressing   Problem: Activity: Goal: Risk for activity intolerance will decrease Outcome: Progressing   Problem: Nutrition: Goal: Adequate nutrition will be maintained Outcome: Progressing   Problem: Coping: Goal: Level of anxiety will decrease Outcome: Progressing   Problem: Elimination: Goal: Will not experience complications related to bowel motility Outcome: Progressing Goal: Will not experience complications related to urinary retention Outcome: Progressing   Problem: Pain Managment: Goal:  General experience of comfort will improve and/or be controlled Outcome: Progressing   Problem: Safety: Goal: Ability to remain free from injury will improve Outcome: Progressing   Problem: Skin Integrity: Goal: Risk for impaired skin integrity will decrease Outcome: Progressing   Problem: Education: Goal: Will demonstrate proper wound care and an understanding of methods to prevent future damage Outcome: Progressing Goal: Knowledge of disease or condition will improve Outcome: Progressing Goal: Knowledge of the prescribed therapeutic regimen will improve Outcome: Progressing Goal: Individualized Educational Video(s) Outcome: Progressing   Problem: Activity: Goal: Risk for activity intolerance will decrease Outcome: Progressing   Problem: Cardiac: Goal: Will achieve and/or maintain hemodynamic stability Outcome: Progressing   Problem: Clinical Measurements: Goal: Postoperative complications will be avoided or minimized Outcome: Progressing   Problem: Respiratory: Goal: Respiratory status will improve Outcome: Progressing   Problem: Skin Integrity: Goal: Wound healing without signs and symptoms of infection Outcome: Progressing Goal: Risk for impaired skin integrity will decrease Outcome: Progressing   Problem: Urinary Elimination: Goal: Ability to achieve and maintain adequate renal perfusion and functioning will improve Outcome: Progressing   Problem: Education: Goal: Will demonstrate proper wound care and an understanding of methods to prevent future damage Outcome: Progressing Goal: Knowledge of disease or condition will improve Outcome: Progressing Goal: Knowledge of the prescribed therapeutic regimen will improve Outcome: Progressing Goal: Individualized Educational Video(s) Outcome: Progressing   Problem: Activity: Goal: Risk for activity intolerance will decrease Outcome: Progressing   Problem: Cardiac: Goal: Will achieve and/or maintain  hemodynamic stability Outcome: Progressing   Problem: Clinical Measurements: Goal: Postoperative complications will be avoided or minimized Outcome: Progressing   Problem: Respiratory: Goal: Respiratory status will improve Outcome: Progressing   Problem: Skin Integrity: Goal: Wound healing without signs and symptoms of infection Outcome: Progressing Goal: Risk for impaired skin integrity will decrease Outcome: Progressing   Problem: Urinary Elimination: Goal: Ability to achieve and maintain adequate renal perfusion and functioning will improve Outcome: Progressing

## 2023-11-18 NOTE — Progress Notes (Signed)
 Patient ID: Anthony Boone, male   DOB: January 19, 1956, 68 y.o.   MRN: [id number] TCTS Evening Rounds:  Hemodynamically stable in sinus rhythm in 80's on empiric amio for PAC's.  UO good.  MT's out. Pleural tube output low.  Ambulated around the ICU today.

## 2023-11-18 NOTE — Progress Notes (Signed)
 2 Days Post-Op Procedure(s) (LRB): CORONARY ARTERY BYPASS GRAFTING (CABG) TIMES THREE USING LEFT INTERNAL MAMMARY ARTERY AND ENDOSCOPICALLY HARVESTED RIGHT GREATER SAPHENOUS VEIN (N/A) ECHOCARDIOGRAM, TRANSESOPHAGEAL, INTRAOPERATIVE (N/A) Subjective: Walked several times yesterday and this morning In and out of Afib 90s Bump in Cre last night, borderline UOP Feels well and looks well  Vital signs in last 24 hours: Temp:  [97.8 F (36.6 C)-98.6 F (37 C)] 98.2 F (36.8 C) (09/06 0328) Pulse Rate:  [84-103] 92 (09/06 0700) Cardiac Rhythm: Normal sinus rhythm (09/06 0430) Resp:  [12-27] 16 (09/06 0700) BP: (89-118)/(52-75) 94/56 (09/06 0700) SpO2:  [93 %-100 %] 95 % (09/06 0700) Arterial Line BP: (102-147)/(51-60) 128/57 (09/05 1100) Weight:  [104.2 kg] 104.2 kg (09/06 0500)  Hemodynamic parameters for last 24 hours:    Intake/Output from previous day: 09/05 0701 - 09/06 0700 In: 738.4 [P.O.:360; I.V.:61.5; IV Piggyback:316.9] Out: 1000 [Urine:710; Chest Tube:290] Intake/Output this shift: No intake/output data recorded.  Physical Exam: General - Resting comfortably in bed CV - RRR in 90s Resp - Unlabored on nasal cannula Abd - Soft, ND/NT Ext - mild edema in legs  Lab Results: Recent Labs    11/17/23 1617 11/18/23 0400  WBC 8.5 8.7  HGB 10.3* 9.5*  HCT 30.6* 28.7*  PLT 104* 107*   BMET:  Recent Labs    11/17/23 1617 11/18/23 0400  NA 136 138  K 4.4 4.7  CL 101 102  CO2 25 25  GLUCOSE 121* 120*  BUN 21 26*  CREATININE 1.51* 1.49*  CALCIUM  7.7* 7.6*    PT/INR:  Recent Labs    11/16/23 1443  LABPROT 18.2*  INR 1.4*   ABG    Component Value Date/Time   PHART 7.360 11/16/2023 2022   HCO3 25.3 11/16/2023 2022   TCO2 27 11/16/2023 2022   ACIDBASEDEF 2.0 11/16/2023 1445   O2SAT 96 11/16/2023 2022   CBG (last 3)  Recent Labs    11/17/23 2017 11/17/23 2325 11/18/23 0328  GLUCAP 129* 152* 113*    Assessment/Plan: S/P Procedure(s)  (LRB): CORONARY ARTERY BYPASS GRAFTING (CABG) TIMES THREE USING LEFT INTERNAL MAMMARY ARTERY AND ENDOSCOPICALLY HARVESTED RIGHT GREATER SAPHENOUS VEIN (N/A) ECHOCARDIOGRAM, TRANSESOPHAGEAL, INTRAOPERATIVE (N/A) Doing well - in/out of Afib, Cre bump NEURO- intact CV- in SR around 90 bpm, short runs of Afib in 90s  Will amio bolus and load today             Continue pacing wires RESP- CXR demonstrates improved lung aeration             Continue IS, pulm hygiene, ambulation  Continue left pleural tube, remove meds today RENAL- creatinine elevated but stable, lytes ok  Weight still elevated             Start 20 IV Lasix  today GI- tolerating carb diet, no bowel function yet             Continue bowel regimen Endo- BG well controlled Continue ISS DVT ppx - SCD + ambulation  Dispo: Continue ICU   LOS: 3 days    Con RAMAN Chetara Kropp 11/18/2023

## 2023-11-19 ENCOUNTER — Inpatient Hospital Stay (HOSPITAL_COMMUNITY)

## 2023-11-19 DIAGNOSIS — I4891 Unspecified atrial fibrillation: Secondary | ICD-10-CM | POA: Diagnosis not present

## 2023-11-19 DIAGNOSIS — I25118 Atherosclerotic heart disease of native coronary artery with other forms of angina pectoris: Secondary | ICD-10-CM | POA: Diagnosis not present

## 2023-11-19 DIAGNOSIS — D696 Thrombocytopenia, unspecified: Secondary | ICD-10-CM | POA: Diagnosis not present

## 2023-11-19 DIAGNOSIS — Z951 Presence of aortocoronary bypass graft: Secondary | ICD-10-CM | POA: Diagnosis not present

## 2023-11-19 LAB — BPAM RBC
Blood Product Expiration Date: 202509212359
Blood Product Expiration Date: 202509212359
Unit Type and Rh: 600
Unit Type and Rh: 600

## 2023-11-19 LAB — TYPE AND SCREEN
ABO/RH(D): A NEG
Antibody Screen: NEGATIVE
Unit division: 0
Unit division: 0

## 2023-11-19 LAB — BASIC METABOLIC PANEL WITH GFR
Anion gap: 9 (ref 5–15)
BUN: 27 mg/dL — ABNORMAL HIGH (ref 8–23)
CO2: 25 mmol/L (ref 22–32)
Calcium: 7.8 mg/dL — ABNORMAL LOW (ref 8.9–10.3)
Chloride: 101 mmol/L (ref 98–111)
Creatinine, Ser: 1.08 mg/dL (ref 0.61–1.24)
GFR, Estimated: >60 mL/min (ref >60)
Glucose, Bld: 149 mg/dL — ABNORMAL HIGH (ref 70–99)
Potassium: 4.3 mmol/L (ref 3.5–5.1)
Sodium: 135 mmol/L (ref 135–145)

## 2023-11-19 LAB — CBC
HCT: 26.6 % — ABNORMAL LOW (ref 39.0–52.0)
Hemoglobin: 9.1 g/dL — ABNORMAL LOW (ref 13.0–17.0)
MCH: 30.3 pg (ref 26.0–34.0)
MCHC: 34.2 g/dL (ref 30.0–36.0)
MCV: 88.7 fL (ref 80.0–100.0)
Platelets: 101 K/uL — ABNORMAL LOW (ref 150–400)
RBC: 3 MIL/uL — ABNORMAL LOW (ref 4.22–5.81)
RDW: 13.8 % (ref 11.5–15.5)
WBC: 6.1 K/uL (ref 4.0–10.5)
nRBC: 0 % (ref 0.0–0.2)

## 2023-11-19 LAB — GLUCOSE, CAPILLARY: Glucose-Capillary: 114 mg/dL — ABNORMAL HIGH (ref 70–99)

## 2023-11-19 MED ORDER — POTASSIUM CHLORIDE CRYS ER 20 MEQ PO TBCR
20.0000 meq | EXTENDED_RELEASE_TABLET | Freq: Every day | ORAL | Status: DC
  Administered 2023-11-19 – 2023-11-21 (×3): 20 meq via ORAL
  Filled 2023-11-19 (×3): qty 1

## 2023-11-19 MED ORDER — AMIODARONE HCL 200 MG PO TABS
400.0000 mg | ORAL_TABLET | Freq: Two times a day (BID) | ORAL | Status: DC
  Administered 2023-11-19 – 2023-11-21 (×5): 400 mg via ORAL
  Filled 2023-11-19 (×5): qty 2

## 2023-11-19 NOTE — Progress Notes (Signed)
 3 Days Post-Op Procedure(s) (LRB): CORONARY ARTERY BYPASS GRAFTING (CABG) TIMES THREE USING LEFT INTERNAL MAMMARY ARTERY AND ENDOSCOPICALLY HARVESTED RIGHT GREATER SAPHENOUS VEIN (N/A) ECHOCARDIOGRAM, TRANSESOPHAGEAL, INTRAOPERATIVE (N/A) Subjective:  Feels well. Says he is ready to go home.  Maintained sinus rhythm on IV amio.  Objective: Vital signs in last 24 hours: Temp:  [97.4 F (36.3 C)-98.6 F (37 C)] 97.4 F (36.3 C) (09/07 0700) Pulse Rate:  [72-91] 86 (09/07 0900) Cardiac Rhythm: Normal sinus rhythm (09/07 0800) Resp:  [14-27] 24 (09/07 0900) BP: (97-136)/(55-83) 102/60 (09/07 0900) SpO2:  [88 %-97 %] 95 % (09/07 0900) Weight:  [103.3 kg] 103.3 kg (09/07 0620)  Hemodynamic parameters for last 24 hours:    Intake/Output from previous day: 09/06 0701 - 09/07 0700 In: 681 [P.O.:240; I.V.:441] Out: [phone]; Chest Tube:130] Intake/Output this shift: Total I/O In: 33.4 [I.V.:33.4] Out: 325 [Urine:325]  General appearance: alert and cooperative Neurologic: intact Heart: regular rate and rhythm Lungs: clear to auscultation bilaterally Extremities: edema mild Wound: incisions ok  Lab Results: Recent Labs    11/18/23 0400 11/19/23 0742  WBC 8.7 6.1  HGB 9.5* 9.1*  HCT 28.7* 26.6*  PLT 107* 101*   BMET:  Recent Labs    11/18/23 0400 11/19/23 0742  NA 138 135  K 4.7 4.3  CL 102 101  CO2 25 25  GLUCOSE 120* 149*  BUN 26* 27*  CREATININE 1.49* 1.08  CALCIUM  7.6* 7.8*    PT/INR:  Recent Labs    11/16/23 1443  LABPROT 18.2*  INR 1.4*   ABG    Component Value Date/Time   PHART 7.360 11/16/2023 2022   HCO3 25.3 11/16/2023 2022   TCO2 27 11/16/2023 2022   ACIDBASEDEF 2.0 11/16/2023 1445   O2SAT 96 11/16/2023 2022   CBG (last 3)  Recent Labs    11/18/23 1628 11/18/23 2113 11/19/23 0634  GLUCAP 113* 102* 114*   CXR: clear  Assessment/Plan: S/P Procedure(s) (LRB): CORONARY ARTERY BYPASS GRAFTING (CABG) TIMES THREE USING LEFT  INTERNAL MAMMARY ARTERY AND ENDOSCOPICALLY HARVESTED RIGHT GREATER SAPHENOUS VEIN (N/A) ECHOCARDIOGRAM, TRANSESOPHAGEAL, INTRAOPERATIVE (N/A)  Hemodynamically stable in sinus rhythm on IV amio. Convert to po. Continue low dose Lopressor .  -944 cc yesterday. Wt down 2 lbs but still 12 lbs over preop. Continue diuresis and KCL.  Creat back to normal.  Glucose under good control with normal Hgb A1c of 5.2 preop on no meds. DC SSI.  Chest tube output low. DC  DC foley.  Continue IS, ambulation.   LOS: 4 days    Anthony Boone 11/19/2023

## 2023-11-19 NOTE — Plan of Care (Signed)
  Problem: Education: Goal: Understanding of CV disease, CV risk reduction, and recovery process will improve Outcome: Progressing Goal: Individualized Educational Video(s) Outcome: Progressing   Problem: Activity: Goal: Ability to return to baseline activity level will improve Outcome: Progressing   Problem: Cardiovascular: Goal: Ability to achieve and maintain adequate cardiovascular perfusion will improve Outcome: Progressing Goal: Vascular access site(s) Level 0-1 will be maintained Outcome: Progressing   Problem: Health Behavior/Discharge Planning: Goal: Ability to safely manage health-related needs after discharge will improve Outcome: Progressing   Problem: Education: Goal: Knowledge of General Education information will improve Description: Including pain rating scale, medication(s)/side effects and non-pharmacologic comfort measures Outcome: Progressing   Problem: Health Behavior/Discharge Planning: Goal: Ability to manage health-related needs will improve Outcome: Progressing   Problem: Clinical Measurements: Goal: Ability to maintain clinical measurements within normal limits will improve Outcome: Progressing Goal: Will remain free from infection Outcome: Progressing Goal: Diagnostic test results will improve Outcome: Progressing Goal: Respiratory complications will improve Outcome: Progressing Goal: Cardiovascular complication will be avoided Outcome: Progressing   Problem: Activity: Goal: Risk for activity intolerance will decrease Outcome: Progressing   Problem: Nutrition: Goal: Adequate nutrition will be maintained Outcome: Progressing   Problem: Coping: Goal: Level of anxiety will decrease Outcome: Progressing   Problem: Elimination: Goal: Will not experience complications related to bowel motility Outcome: Progressing Goal: Will not experience complications related to urinary retention Outcome: Progressing   Problem: Pain Managment: Goal:  General experience of comfort will improve and/or be controlled Outcome: Progressing   Problem: Safety: Goal: Ability to remain free from injury will improve Outcome: Progressing   Problem: Skin Integrity: Goal: Risk for impaired skin integrity will decrease Outcome: Progressing   Problem: Education: Goal: Will demonstrate proper wound care and an understanding of methods to prevent future damage Outcome: Progressing Goal: Knowledge of disease or condition will improve Outcome: Progressing Goal: Knowledge of the prescribed therapeutic regimen will improve Outcome: Progressing Goal: Individualized Educational Video(s) Outcome: Progressing   Problem: Activity: Goal: Risk for activity intolerance will decrease Outcome: Progressing   Problem: Cardiac: Goal: Will achieve and/or maintain hemodynamic stability Outcome: Progressing   Problem: Clinical Measurements: Goal: Postoperative complications will be avoided or minimized Outcome: Progressing   Problem: Respiratory: Goal: Respiratory status will improve Outcome: Progressing   Problem: Skin Integrity: Goal: Wound healing without signs and symptoms of infection Outcome: Progressing Goal: Risk for impaired skin integrity will decrease Outcome: Progressing   Problem: Urinary Elimination: Goal: Ability to achieve and maintain adequate renal perfusion and functioning will improve Outcome: Progressing   Problem: Education: Goal: Will demonstrate proper wound care and an understanding of methods to prevent future damage Outcome: Progressing Goal: Knowledge of disease or condition will improve Outcome: Progressing Goal: Knowledge of the prescribed therapeutic regimen will improve Outcome: Progressing Goal: Individualized Educational Video(s) Outcome: Progressing   Problem: Activity: Goal: Risk for activity intolerance will decrease Outcome: Progressing   Problem: Cardiac: Goal: Will achieve and/or maintain  hemodynamic stability Outcome: Progressing   Problem: Clinical Measurements: Goal: Postoperative complications will be avoided or minimized Outcome: Progressing   Problem: Respiratory: Goal: Respiratory status will improve Outcome: Progressing   Problem: Skin Integrity: Goal: Wound healing without signs and symptoms of infection Outcome: Progressing Goal: Risk for impaired skin integrity will decrease Outcome: Progressing   Problem: Urinary Elimination: Goal: Ability to achieve and maintain adequate renal perfusion and functioning will improve Outcome: Progressing

## 2023-11-19 NOTE — Progress Notes (Signed)
 Patient ID: Anthony Boone, male   DOB: Jul 14, 1955, 68 y.o.   MRN: [id number]  TCTS Evening Rounds:  Hemodynamically stable in sinus rhythm after conversion to po amio today.   Ambulating a lot today.  -1325 cc today with lasix .

## 2023-11-19 NOTE — Progress Notes (Signed)
 NAME:  GAL FELDHAUS, MRN:  [id number], DOB:  [birth year], LOS: 4 ADMISSION DATE:  11/15/2023, CONSULTATION DATE: 9//2025 REFERRING MD:  Daniel Benders, CHIEF COMPLAINT: Status post CABG  Thank you yeah  History of Present Illness:  68 year old male with hypertension, hyperlipidemia and obesity who was worked up for exertional syncope, noted to have multivessel coronary artery disease, underwent CABG x 3 Patient remained intubated and was transferred to ICU, PCCM was consulted for help evaluation medical management  Pertinent  Medical History   Past Medical History:  Diagnosis Date   Coronary artery calcification seen on CAT scan 10/09/2023   Erectile dysfunction 01/12/2018   Former smoker 01/15/2018   Hyperlipidemia    Hypertension    Medication management 07/29/2013   Obesity (BMI 30.0-34.9) 01/12/2018   Other abnormal glucose 07/29/2013   Vitamin D  deficiency     Significant Hospital Events: Including procedures, antibiotic start and stop dates in addition to other pertinent events   9/4 CABG x 3, extubated 9/5 came off of vasopressor support  Interim History / Subjective:  Patient remain in sinus rhythm He has been on amiodarone  loading dose  Stated pain is well-controlled Denies dizziness, walked in the hallway  Objective    Blood pressure 99/60, pulse 78, temperature (!) 97.4 F (36.3 C), temperature source Axillary, resp. rate 19, height 5' 8 (1.727 m), weight 103.3 kg, SpO2 96%.        Intake/Output Summary (Last 24 hours) at 11/19/2023 0813 Last data filed at 11/19/2023 0700 Gross per 24 hour  Intake 680.95 ml  Output 1625 ml  Net -944.05 ml   Filed Weights   11/17/23 0600 11/18/23 0500 11/19/23 9379  Weight: 104 kg 104.2 kg 103.3 kg    Examination: General: Elderly male, sitting on recliner HEENT: [place]/AT, eyes anicteric.  moist mucus membranes Neuro: Alert, awake following commands Chest: Central sternotomy incision looks clean and dry, coarse breath  sounds, no wheezes or rhonchi.  Left-sided chest tube in place Heart: Regular rate and rhythm, no murmurs or gallops Abdomen: Soft, nontender, nondistended, bowel sounds present   Patient Lines/Drains/Airways Status     Active Line/Drains/Airways     Name Placement date Placement time Site Days   Peripheral IV 11/15/23 20 G 1.88 Left;Anterior Forearm 11/15/23  2301  Forearm  4   Peripheral IV 11/15/23 20 G 2.5 Left;Anterior;Upper Arm 11/15/23  2302  Arm  4   Peripheral IV 11/16/23 18 G Right Wrist 11/16/23  0650  Wrist  3   Urethral Catheter D. Acuna RN Latex;Straight-tip;Temperature probe 16 Fr. 11/16/23  0755  Latex;Straight-tip;Temperature probe  3   Y Chest Tube 1, 2, and 3 1 Medial Mediastinal 28 Fr. 2 Medial Mediastinal 36 Fr. 3 Left Pleural 28 Fr. 11/16/23  1321  -- 3   Wound 11/16/23 0920 Surgical Closed Surgical Incision Chest 11/16/23  0920  Chest  3   Wound 11/16/23 0920 Surgical Closed Surgical Incision Leg Right 11/16/23  0920  Leg  3         Resolved problem list  Acute respiratory insufficiency, postop  Assessment and Plan  Multivessel coronary artery disease s/p CABG x 3 Continue aspirin , statin and metoprolol  Chest tube management TCTS Chest tube output was 130 cc in last 24 hours Continue tramadol , oxycodone  and morphine  as needed Closely monitor chest tube output  Paroxysmal A-fib After amiodarone  bolus and infusion patient remained in sinus rhythm He is on low-dose metoprolol   Continue amiodarone  for now   Acute kidney  injury Repeat serum Cr trended down Monitor intake and output Avoid nephrotoxic agent  Hypertension Continue metoprolol   Hyperlipidemia Continue Crestor   Expected perioperative blood loss anemia not clinically significant Thrombocytopenia due to CPB, clinically significant, status post 1 unit platelet transfusion H&H is stable between 9-10, platelet counts are stable around 100 Closely monitor  Obesity Diet and exercise  counseling provided  Labs   CBC: Recent Labs  Lab 11/16/23 1443 11/16/23 1445 11/16/23 2022 11/16/23 2103 11/17/23 0513 11/17/23 1617 11/18/23 0400  WBC 7.7  --   --  7.8 7.5 8.5 8.7  HGB 12.0*   < > 9.9* 11.3* 10.3* 10.3* 9.5*  HCT 34.6*   < > 29.0* 32.6* 29.9* 30.6* 28.7*  MCV 86.7  --   --  87.4 87.9 90.5 90.5  PLT 88*  --   --  111* 106* 104* 107*   < > = values in this interval not displayed.    Basic Metabolic Panel: Recent Labs  Lab 11/16/23 0604 11/16/23 0756 11/16/23 1318 11/16/23 1445 11/16/23 2022 11/16/23 2103 11/17/23 0513 11/17/23 1617 11/18/23 0400  NA 138   < > 136   < > 140 140 138 136 138  K 4.4   < > 5.3*   < > 4.8 4.5 4.5 4.4 4.7  CL 103   < > 102  --   --  106 108 101 102  CO2 24  --   --   --   --  24 25 25 25   GLUCOSE 107*   < > 160*  --   --  146* 148* 121* 120*  BUN 13   < > 14  --   --  14 15 21  26*  CREATININE 1.01   < > 1.00  --   --  1.15 1.24 1.51* 1.49*  CALCIUM  9.1  --   --   --   --  7.4* 7.5* 7.7* 7.6*  MG  --   --   --   --   --  3.2* 2.9* 3.0*  --    < > = values in this interval not displayed.   GFR: Estimated Creatinine Clearance: 56.1 mL/min (A) (by C-G formula based on SCr of 1.49 mg/dL (H)). Recent Labs  Lab 11/16/23 2103 11/17/23 0513 11/17/23 1617 11/18/23 0400  WBC 7.8 7.5 8.5 8.7    Liver Function Tests: No results for input(s): AST, ALT, ALKPHOS, BILITOT, PROT, ALBUMIN  in the last 168 hours. No results for input(s): LIPASE, AMYLASE in the last 168 hours. No results for input(s): AMMONIA in the last 168 hours.  ABG    Component Value Date/Time   PHART 7.360 11/16/2023 2022   PCO2ART 44.5 11/16/2023 2022   PO2ART 85 11/16/2023 2022   HCO3 25.3 11/16/2023 2022   TCO2 27 11/16/2023 2022   ACIDBASEDEF 2.0 11/16/2023 1445   O2SAT 96 11/16/2023 2022     Coagulation Profile: Recent Labs  Lab 11/16/23 1443  INR 1.4*    Cardiac Enzymes: No results for input(s): CKTOTAL, CKMB,  CKMBINDEX, TROPONINI in the last 168 hours.  HbA1C: Hgb A1c MFr Bld  Date/Time Value Ref Range Status  11/16/2023 06:04 AM 5.2 4.8 - 5.6 % Final    Comment:    (NOTE) Diagnosis of Diabetes The following HbA1c ranges recommended by the American Diabetes Association (ADA) may be used as an aid in the diagnosis of diabetes mellitus.  Hemoglobin             Suggested  A1C NGSP%              Diagnosis  <5.7                   Non Diabetic  5.7-6.4                Pre-Diabetic  >6.4                   Diabetic  <7.0                   Glycemic control for                       adults with diabetes.    12/23/2022 11:17 AM 5.2 <5.7 % of total Hgb Final    Comment:    For the purpose of screening for the presence of diabetes: . <5.7%       Consistent with the absence of diabetes 5.7-6.4%    Consistent with increased risk for diabetes             (prediabetes) > or =6.5%  Consistent with diabetes . This assay result is consistent with a decreased risk of diabetes. . Currently, no consensus exists regarding use of hemoglobin A1c for diagnosis of diabetes in children. . According to American Diabetes Association (ADA) guidelines, hemoglobin A1c <7.0% represents optimal control in non-pregnant diabetic patients. Different metrics may apply to specific patient populations.  Standards of Medical Care in Diabetes(ADA). .     CBG: Recent Labs  Lab 11/18/23 0830 11/18/23 1210 11/18/23 1628 11/18/23 2113 11/19/23 0634  GLUCAP 119* 112* 113* 102* 114*     Valinda Novas, MD [facility] See Amion for pager If no response to pager, please call [phone] until 7pm After 7pm, Please call E-link [phone]

## 2023-11-20 ENCOUNTER — Inpatient Hospital Stay (HOSPITAL_COMMUNITY)

## 2023-11-20 DIAGNOSIS — I480 Paroxysmal atrial fibrillation: Secondary | ICD-10-CM

## 2023-11-20 DIAGNOSIS — I2510 Atherosclerotic heart disease of native coronary artery without angina pectoris: Secondary | ICD-10-CM

## 2023-11-20 DIAGNOSIS — J9811 Atelectasis: Secondary | ICD-10-CM | POA: Diagnosis not present

## 2023-11-20 DIAGNOSIS — J90 Pleural effusion, not elsewhere classified: Secondary | ICD-10-CM | POA: Diagnosis not present

## 2023-11-20 DIAGNOSIS — Z951 Presence of aortocoronary bypass graft: Secondary | ICD-10-CM | POA: Diagnosis not present

## 2023-11-20 DIAGNOSIS — I200 Unstable angina: Secondary | ICD-10-CM

## 2023-11-20 DIAGNOSIS — Z48812 Encounter for surgical aftercare following surgery on the circulatory system: Secondary | ICD-10-CM | POA: Diagnosis not present

## 2023-11-20 HISTORY — DX: Presence of aortocoronary bypass graft: Z95.1

## 2023-11-20 HISTORY — DX: Atherosclerotic heart disease of native coronary artery without angina pectoris: I25.10

## 2023-11-20 HISTORY — DX: Paroxysmal atrial fibrillation: I48.0

## 2023-11-20 LAB — CBC
HCT: 26.5 % — ABNORMAL LOW (ref 39.0–52.0)
Hemoglobin: 8.9 g/dL — ABNORMAL LOW (ref 13.0–17.0)
MCH: 30.1 pg (ref 26.0–34.0)
MCHC: 33.6 g/dL (ref 30.0–36.0)
MCV: 89.5 fL (ref 80.0–100.0)
Platelets: 115 K/uL — ABNORMAL LOW (ref 150–400)
RBC: 2.96 MIL/uL — ABNORMAL LOW (ref 4.22–5.81)
RDW: 13.8 % (ref 11.5–15.5)
WBC: 5.2 K/uL (ref 4.0–10.5)
nRBC: 0 % (ref 0.0–0.2)

## 2023-11-20 LAB — BASIC METABOLIC PANEL WITH GFR
Anion gap: 9 (ref 5–15)
BUN: 21 mg/dL (ref 8–23)
CO2: 25 mmol/L (ref 22–32)
Calcium: 7.9 mg/dL — ABNORMAL LOW (ref 8.9–10.3)
Chloride: 102 mmol/L (ref 98–111)
Creatinine, Ser: 0.96 mg/dL (ref 0.61–1.24)
GFR, Estimated: >60 mL/min (ref >60)
Glucose, Bld: 106 mg/dL — ABNORMAL HIGH (ref 70–99)
Potassium: 4.3 mmol/L (ref 3.5–5.1)
Sodium: 136 mmol/L (ref 135–145)

## 2023-11-20 MED ORDER — METOPROLOL TARTRATE 25 MG/10 ML ORAL SUSPENSION
25.0000 mg | Freq: Two times a day (BID) | ORAL | Status: DC
  Filled 2023-11-20 (×2): qty 10

## 2023-11-20 MED ORDER — METOPROLOL TARTRATE 25 MG PO TABS
25.0000 mg | ORAL_TABLET | Freq: Two times a day (BID) | ORAL | Status: DC
  Administered 2023-11-20 – 2023-11-21 (×3): 25 mg via ORAL
  Filled 2023-11-20 (×3): qty 1

## 2023-11-20 MED ORDER — FUROSEMIDE 40 MG PO TABS
40.0000 mg | ORAL_TABLET | Freq: Every day | ORAL | Status: DC
  Administered 2023-11-20 – 2023-11-21 (×2): 40 mg via ORAL
  Filled 2023-11-20 (×2): qty 1

## 2023-11-20 MED ORDER — SODIUM CHLORIDE 0.9 % IV SOLN
250.0000 mL | INTRAVENOUS | Status: AC | PRN
Start: 2023-11-20 — End: 2023-11-21

## 2023-11-20 MED ORDER — ~~LOC~~ CARDIAC SURGERY, PATIENT & FAMILY EDUCATION: Freq: Once | Status: AC

## 2023-11-20 MED ORDER — SODIUM CHLORIDE 0.9% FLUSH: 3.0000 mL | INTRAVENOUS | Status: DC | PRN

## 2023-11-20 MED ORDER — SODIUM CHLORIDE 0.9% FLUSH
3.0000 mL | Freq: Two times a day (BID) | INTRAVENOUS | Status: DC
  Administered 2023-11-20 – 2023-11-21 (×3): 3 mL via INTRAVENOUS

## 2023-11-20 MED FILL — Sodium Chloride IV Soln 0.9%: INTRAVENOUS | Qty: 3000 | Status: AC

## 2023-11-20 MED FILL — Potassium Chloride Inj 2 mEq/ML: INTRAVENOUS | Qty: 40 | Status: AC

## 2023-11-20 MED FILL — Lidocaine HCl Local Preservative Free (PF) Inj 2%: INTRAMUSCULAR | Qty: 14 | Status: AC

## 2023-11-20 MED FILL — Calcium Chloride Inj 10%: INTRAVENOUS | Qty: 10 | Status: AC

## 2023-11-20 MED FILL — Sodium Bicarbonate IV Soln 8.4%: INTRAVENOUS | Qty: 100 | Status: AC

## 2023-11-20 MED FILL — Heparin Sodium (Porcine) Inj 1000 Unit/ML: Qty: 1000 | Status: AC

## 2023-11-20 MED FILL — Mannitol IV Soln 20%: INTRAVENOUS | Qty: 500 | Status: AC

## 2023-11-20 MED FILL — Heparin Sodium (Porcine) Inj 1000 Unit/ML: INTRAMUSCULAR | Qty: 20 | Status: AC

## 2023-11-20 MED FILL — Electrolyte-R (PH 7.4) Solution: INTRAVENOUS | Qty: 3000 | Status: AC

## 2023-11-20 NOTE — Progress Notes (Signed)
 PM Rounding Note  Patient transferred to 4E after removal of pacing wires.  Doing well, vitals stable, ambulating frequently.  Vitals:   11/20/23 1202 11/20/23 1300  BP: 135/68 (!) 119/52  Pulse: 73 72  Resp: (!) 22 17  Temp:    SpO2: 97% 94%   General - Resting comfortably in bed CV - RRR Resp - Unlabored on RA Abd - Soft, ND/NT Ext - No edema  PM CXR - minimal left pleural effusion; improved aeration  A/P:  POD 4 CABG x 3 doing well.  Anthony Boone is anxious to get home, but will plan to discharge first thing tomorrow morning.  Continue to encourage IS and ambulation.  Con [name], MD Cardiothoracic Surgery Pager: [phone]    [address] [place] [address]             [phone]

## 2023-11-20 NOTE — Progress Notes (Signed)
 Pt has ambulated x2 today already, the last with the RW, which he sts went well. He has a RW at home.   Discussed with pt and wife IS, sternal precautions, diet, exercise, and CRPII. Pt receptive. Will refer to [facility].  He should walk atleast once more today, could walk with his wife or staff and RW.  [id number] Aliene Aris BS, ACSM-CEP 11/20/2023 10:55 AM

## 2023-11-20 NOTE — Progress Notes (Addendum)
 TCTS DAILY ICU PROGRESS NOTE                   [address]            [place] [address]          [phone]   4 Days Post-Op Procedure(s) (LRB): CORONARY ARTERY BYPASS GRAFTING (CABG) TIMES THREE USING LEFT INTERNAL MAMMARY ARTERY AND ENDOSCOPICALLY HARVESTED RIGHT GREATER SAPHENOUS VEIN (N/A) ECHOCARDIOGRAM, TRANSESOPHAGEAL, INTRAOPERATIVE (N/A)  Total Length of Stay:  LOS: 5 days   Subjective: Patient without specific complaint this am.  Objective: Vital signs in last 24 hours: Temp:  [97.6 F (36.4 C)-98.5 F (36.9 C)] 98.3 F (36.8 C) (09/07 2316) Pulse Rate:  [67-102] 84 (09/08 0600) Cardiac Rhythm: Normal sinus rhythm (09/07 2000) Resp:  [14-35] 22 (09/08 0600) BP: (99-140)/(57-106) 128/67 (09/08 0600) SpO2:  [85 %-95 %] 94 % (09/08 0600) Weight:  [99.2 kg] 99.2 kg (09/08 0500)  Filed Weights   11/18/23 0500 11/19/23 0620 11/20/23 0500  Weight: 104.2 kg 103.3 kg 99.2 kg    Weight change: -4.1 kg      Intake/Output from previous day: 09/07 0701 - 09/08 0700 In: 434.6 [P.O.:360; I.V.:74.6] Out: 3250 [Urine:3250]  Intake/Output this shift: No intake/output data recorded.  Current Meds: Scheduled Meds:  acetaminophen   1,000 mg Oral Q6H   Or   acetaminophen  (TYLENOL ) oral liquid 160 mg/5 mL  1,000 mg Per Tube Q6H   amiodarone   400 mg Oral BID   aspirin  EC  325 mg Oral Daily   Or   aspirin   324 mg Per Tube Daily   bisacodyl   10 mg Oral Daily   Or   bisacodyl   10 mg Rectal Daily   Chlorhexidine  Gluconate Cloth  6 each Topical Daily   docusate sodium   200 mg Oral Daily   furosemide   40 mg Oral Daily   Influenza vac split trivalent PF  0.5 mL Intramuscular Tomorrow-1000   metoprolol  tartrate  12.5 mg Oral BID   Or   metoprolol  tartrate  12.5 mg Per Tube BID   mupirocin  ointment  1 Application Nasal BID   pantoprazole   40 mg Oral Daily   potassium chloride   20 mEq Oral Daily   rosuvastatin   20 mg Oral Daily   Continuous  Infusions: PRN Meds:.artificial tears, lip balm, metoprolol  tartrate, morphine  injection, ondansetron  (ZOFRAN ) IV, mouth rinse, oxyCODONE , traMADol   General appearance: alert, cooperative, and no distress Neurologic: intact Heart: RRR Lungs: Slightly diminished bibasilar breath sounds Abdomen: Soft, non tender, bowel sounds Extremities: Mild LE edema R>L Wound: Proximal sternal wound with slight bloody drainage on dressing. RLE wounds are clean and dry. No sign of infection  Lab Results: CBC: Recent Labs    11/19/23 0742 11/20/23 0338  WBC 6.1 5.2  HGB 9.1* 8.9*  HCT 26.6* 26.5*  PLT 101* 115*   BMET:  Recent Labs    11/19/23 0742 11/20/23 0338  NA 135 136  K 4.3 4.3  CL 101 102  CO2 25 25  GLUCOSE 149* 106*  BUN 27* 21  CREATININE 1.08 0.96  CALCIUM  7.8* 7.9*    CMET: Lab Results  Component Value Date   WBC 5.2 11/20/2023   HGB 8.9 (L) 11/20/2023   HCT 26.5 (L) 11/20/2023   PLT 115 (L) 11/20/2023   GLUCOSE 106 (H) 11/20/2023   CHOL 157 10/09/2023   TRIG 326.0 (H) 10/09/2023   HDL 36.20 (L) 10/09/2023   LDLCALC 55 10/09/2023  ALT 20 10/09/2023   AST 18 10/09/2023   NA 136 11/20/2023   K 4.3 11/20/2023   CL 102 11/20/2023   CREATININE 0.96 11/20/2023   BUN 21 11/20/2023   CO2 25 11/20/2023   TSH 1.48 10/09/2023   PSA 0.66 12/23/2022   INR 1.4 (H) 11/16/2023   HGBA1C 5.2 11/16/2023   MICROALBUR 2.6 12/23/2022      PT/INR: No results for input(s): LABPROT, INR in the last 72 hours. Radiology: DG CHEST PORT 1 VIEW Result Date: 11/20/2023 CLINICAL DATA:  [id number].  Encounter for chest tube removal. EXAM: PORTABLE CHEST 1 VIEW COMPARISON:  Portable chest yesterday at 9:29 a.m. FINDINGS: 5:32 a.m. interval left basilar chest tube removal, with small increased left pleural effusion. There is no measurable pneumothorax. Increased linear left basilar atelectasis this same no focal consolidation. Intact median sternotomy sutures and stable CABG change. Mild  cardiomegaly. No vascular congestion is seen. The mediastinum is stable. No new osseous abnormality. IMPRESSION: 1. Interval left basilar chest tube removal, with small increased left pleural effusion. No measurable pneumothorax. 2. Increased linear left basilar atelectasis. Electronically Signed   By: Francis Quam M.D.   On: 11/20/2023 07:03   DG CHEST PORT 1 VIEW Result Date: 11/19/2023 CLINICAL DATA:  Status post cardiac surgery EXAM: PORTABLE CHEST 1 VIEW COMPARISON:  Chest radiograph dated 11/18/2023 FINDINGS: Lines/tubes: Interval removal of right IJ sheath and mediastinal catheters. Unchanged left basilar pleural catheter. Lungs: Well inflated lungs. No focal consolidation. Pleura: Trace blunting of right costophrenic angle. Trace left apical pneumothorax. Heart/mediastinum: Similar enlarged cardiomediastinal silhouette. Bones: Median sternotomy wires are nondisplaced. IMPRESSION: 1.  Support apparatus as described. 2. Trace left apical pneumothorax with left basilar pleural catheter in place. 3. Trace right pleural effusion. Electronically Signed   By: Limin  Xu M.D.   On: 11/19/2023 10:52     Assessment/Plan: S/P Procedure(s) (LRB): CORONARY ARTERY BYPASS GRAFTING (CABG) TIMES THREE USING LEFT INTERNAL MAMMARY ARTERY AND ENDOSCOPICALLY HARVESTED RIGHT GREATER SAPHENOUS VEIN (N/A) ECHOCARDIOGRAM, TRANSESOPHAGEAL, INTRAOPERATIVE (N/A) CV-Previous a fib. SR this am. On Amiodarone  400 mg bid and Lopressor  12.5 mg bid. Will increase to 25 mg bid for better HR/BP control. Pulmonary-on room air. PA/LAT CXR ordered for today. Encourage incentive spirometer. Above pre op weight, requires further diuresis-on oral Lasix  40 mg daily Expected post op blood loss anemia-H and H this am stable at 8.9 and 26.5 5. CBGs 113/102/114. Pre op HGA1C 5.2. Will stop accu checks and SS PRN upon transfer 6. Mild thrombocytopenia-platelets this am up to 115,000 7. Remove EPW 8. Stop stool softeners secondary to  loose stool 9. Transfer to 4E in several hours (vital signs stable after EPW removal) 10. Disposition-discharge in am  Kyla CHRISTELLA Donald PA-C 11/20/2023 7:12 AM

## 2023-11-20 NOTE — Progress Notes (Signed)
 NAME:  Anthony Boone, MRN:  [id number], DOB:  Jul 19, 1955, LOS: 5 ADMISSION DATE:  11/15/2023, CONSULTATION DATE: 9//2025 REFERRING MD:  Daniel Benders, CHIEF COMPLAINT: Status post CABG   History of Present Illness:  68 year old male with hypertension, hyperlipidemia and obesity who was worked up for exertional syncope, noted to have multivessel coronary artery disease, underwent CABG x 3 Patient remained intubated and was transferred to ICU, PCCM was consulted for help evaluation medical management  Pertinent  Medical History   Past Medical History:  Diagnosis Date   Coronary artery calcification seen on CAT scan 10/09/2023   Erectile dysfunction 01/12/2018   Former smoker 01/15/2018   Hyperlipidemia    Hypertension    Medication management 07/29/2013   Obesity (BMI 30.0-34.9) 01/12/2018   Other abnormal glucose 07/29/2013   Vitamin D  deficiency     Significant Hospital Events: Including procedures, antibiotic start and stop dates in addition to other pertinent events   9/4 CABG x 3, extubated 9/5 came off of vasopressor support  Interim History / Subjective:   He denies pain.  No shortness of breath.  He has been up ambulating.  Last BM was yesterday.  Pacemaker wires removed today. Objective    Blood pressure 133/85, pulse 89, temperature 98.3 F (36.8 C), temperature source Oral, resp. rate 15, height 5' 8 (1.727 m), weight 99.2 kg, SpO2 94%.        Intake/Output Summary (Last 24 hours) at 11/20/2023 0737 Last data filed at 11/20/2023 0600 Gross per 24 hour  Intake 434.61 ml  Output 3250 ml  Net -2815.39 ml   Filed Weights   11/18/23 0500 11/19/23 0620 11/20/23 0500  Weight: 104.2 kg 103.3 kg 99.2 kg    Examination: General This is a very pleasant 68 year old male patient currently sitting up in the chair he is in no acute distress HEENT normocephalic atraumatic no jugular venous distention is appreciated Pulmonary clear to auscultation on room  air Cardiac/thoracic sternal dressing intact, dressing from pacemaker wire removals are intact no staining noted.  Regular rate and rhythm currently.  Sinus rhythm on telemetry.  No murmur rub or gallop appreciated. Extremities warm, well-perfused.  Trace ankle edema.  The saphenous graft site is a well-approximated without drainage or redness or pain GU voiding Neuro intact. Resolved problem list  Acute respiratory insufficiency, postop Acute kidney injury  Assessment and Plan  Multivessel coronary artery disease s/p CABG x 3 Chest tube removed 9/7 plan Continue aspirin , statin and metoprolol  See afib rx below re: BB and amio Continue tramadol , oxycodone  and morphine  as needed Lasix  today per CVTS (above pre-op weight) Cont mobilization Removing EPW today then to 4E Ensure bowel regiment  Post-operative Paroxysmal A-fib and HTN After amiodarone  bolus and infusion patient remained in sinus rhythm plan Continue loading amiodarone  dose at 400mg  bid Metoprolol   increased to 25mg  bid (for BP) Tele Ensure Mg> 2 and K >4 (scheduled KCL already ordered)  Hyperlipidemia plan Continue Crestor   Expected perioperative blood loss anemia not clinically significant, hgb trending down some. His wt is up. May be a little dilutional as well.  Plan Trend   Thrombocytopenia due to CPB, clinically significant, status post 1 unit platelet transfusion -has been holding > 100K for last 4d. Starting to recover Plan Trend   Obesity Plan Diet and exercise counseling provided  Agree with transfer Critical care signing off once he is out of the intensive care  My time 25 minutes Maude FORBES Banner ACNP-BC [facility] Pulmonary/Critical Care Pager #  [phone] OR # [phone] if no answer

## 2023-11-20 NOTE — Plan of Care (Signed)
  Problem: Education: Goal: Understanding of CV disease, CV risk reduction, and recovery process will improve Outcome: Progressing Goal: Individualized Educational Video(s) Outcome: Progressing   Problem: Activity: Goal: Ability to return to baseline activity level will improve Outcome: Progressing   Problem: Cardiovascular: Goal: Ability to achieve and maintain adequate cardiovascular perfusion will improve Outcome: Progressing Goal: Vascular access site(s) Level 0-1 will be maintained Outcome: Progressing   Problem: Health Behavior/Discharge Planning: Goal: Ability to safely manage health-related needs after discharge will improve Outcome: Progressing   Problem: Education: Goal: Knowledge of General Education information will improve Description: Including pain rating scale, medication(s)/side effects and non-pharmacologic comfort measures Outcome: Progressing   Problem: Health Behavior/Discharge Planning: Goal: Ability to manage health-related needs will improve Outcome: Progressing   Problem: Clinical Measurements: Goal: Ability to maintain clinical measurements within normal limits will improve Outcome: Progressing Goal: Will remain free from infection Outcome: Progressing Goal: Diagnostic test results will improve Outcome: Progressing Goal: Respiratory complications will improve Outcome: Progressing Goal: Cardiovascular complication will be avoided Outcome: Progressing   Problem: Activity: Goal: Risk for activity intolerance will decrease Outcome: Progressing   Problem: Nutrition: Goal: Adequate nutrition will be maintained Outcome: Progressing   Problem: Coping: Goal: Level of anxiety will decrease Outcome: Progressing   Problem: Elimination: Goal: Will not experience complications related to bowel motility Outcome: Progressing Goal: Will not experience complications related to urinary retention Outcome: Progressing   Problem: Pain Managment: Goal:  General experience of comfort will improve and/or be controlled Outcome: Progressing   Problem: Safety: Goal: Ability to remain free from injury will improve Outcome: Progressing   Problem: Skin Integrity: Goal: Risk for impaired skin integrity will decrease Outcome: Progressing   Problem: Education: Goal: Will demonstrate proper wound care and an understanding of methods to prevent future damage Outcome: Progressing Goal: Knowledge of disease or condition will improve Outcome: Progressing Goal: Knowledge of the prescribed therapeutic regimen will improve Outcome: Progressing Goal: Individualized Educational Video(s) Outcome: Progressing   Problem: Activity: Goal: Risk for activity intolerance will decrease Outcome: Progressing   Problem: Cardiac: Goal: Will achieve and/or maintain hemodynamic stability Outcome: Progressing   Problem: Clinical Measurements: Goal: Postoperative complications will be avoided or minimized Outcome: Progressing   Problem: Respiratory: Goal: Respiratory status will improve Outcome: Progressing   Problem: Skin Integrity: Goal: Wound healing without signs and symptoms of infection Outcome: Progressing Goal: Risk for impaired skin integrity will decrease Outcome: Progressing   Problem: Urinary Elimination: Goal: Ability to achieve and maintain adequate renal perfusion and functioning will improve Outcome: Progressing   Problem: Education: Goal: Will demonstrate proper wound care and an understanding of methods to prevent future damage Outcome: Progressing Goal: Knowledge of disease or condition will improve Outcome: Progressing Goal: Knowledge of the prescribed therapeutic regimen will improve Outcome: Progressing Goal: Individualized Educational Video(s) Outcome: Progressing   Problem: Activity: Goal: Risk for activity intolerance will decrease Outcome: Progressing   Problem: Cardiac: Goal: Will achieve and/or maintain  hemodynamic stability Outcome: Progressing   Problem: Clinical Measurements: Goal: Postoperative complications will be avoided or minimized Outcome: Progressing   Problem: Respiratory: Goal: Respiratory status will improve Outcome: Progressing   Problem: Skin Integrity: Goal: Wound healing without signs and symptoms of infection Outcome: Progressing Goal: Risk for impaired skin integrity will decrease Outcome: Progressing   Problem: Urinary Elimination: Goal: Ability to achieve and maintain adequate renal perfusion and functioning will improve Outcome: Progressing

## 2023-11-20 NOTE — Progress Notes (Signed)
 RT did respiratory consult with patient and did protocol assessment.

## 2023-11-21 ENCOUNTER — Other Ambulatory Visit (HOSPITAL_COMMUNITY): Payer: Self-pay

## 2023-11-21 MED ORDER — ASPIRIN 325 MG PO TBEC: 325.0000 mg | DELAYED_RELEASE_TABLET | Freq: Every day | ORAL | Status: DC

## 2023-11-21 MED ORDER — FUROSEMIDE 40 MG PO TABS
40.0000 mg | ORAL_TABLET | Freq: Every day | ORAL | 0 refills | Status: DC
  Filled 2023-11-21: qty 3, 3d supply, fill #0

## 2023-11-21 MED ORDER — TRAMADOL HCL 50 MG PO TABS
50.0000 mg | ORAL_TABLET | Freq: Four times a day (QID) | ORAL | 0 refills | Status: DC | PRN
  Filled 2023-11-21: qty 30, 8d supply, fill #0

## 2023-11-21 MED ORDER — AMIODARONE HCL 200 MG PO TABS
ORAL_TABLET | ORAL | 0 refills | Status: DC
  Filled 2023-11-21: qty 120, 90d supply, fill #0

## 2023-11-21 MED ORDER — POTASSIUM CHLORIDE CRYS ER 20 MEQ PO TBCR
20.0000 meq | EXTENDED_RELEASE_TABLET | Freq: Every day | ORAL | 0 refills | Status: DC
  Filled 2023-11-21: qty 3, 3d supply, fill #0

## 2023-11-21 MED ORDER — METOPROLOL TARTRATE 25 MG PO TABS
25.0000 mg | ORAL_TABLET | Freq: Two times a day (BID) | ORAL | 1 refills | Status: DC
  Filled 2023-11-21: qty 60, 30d supply, fill #0

## 2023-11-21 MED ORDER — LISINOPRIL 20 MG PO TABS
20.0000 mg | ORAL_TABLET | Freq: Every day | ORAL | 1 refills | Status: DC
  Filled 2023-11-21: qty 30, 30d supply, fill #0

## 2023-11-21 NOTE — TOC Transition Note (Signed)
 Transition of Care (TOC) - Discharge Note Rayfield Gobble RN, BSN Inpatient Care Management Unit 4E- RN Case Manager See Treatment Team for direct phone #   Patient Details  Name: Anthony Boone MRN: [id number] Date of Birth: January 20, 1956  [facility]) CM/SW Contact:  Gobble Rayfield Hurst, RN Phone Number: 11/21/2023, 3:48 PM   Clinical Narrative:    Pt s/p CABG stable for transition home today. Family to transport home. No DME needs noted.   CM was notified by [facility] liaison that TCTS office made pre-op referral for [facility] needs- liaison will follow up with pt post discharge.   No further IP CM needs noted.    Final next level of care: Home w Home Health Services Barriers to Discharge: Barriers Resolved   Patient Goals and CMS Choice Patient states their goals for this hospitalization and ongoing recovery are:: plan to transition home once stable.   Choice offered to / list presented to : NA      Discharge Placement               Home         Discharge Plan and Services Additional resources added to the After Visit Summary for   In-house Referral: NA Discharge Planning Services: CM Consult Post Acute Care Choice: Home Health          DME Arranged: N/A DME Agency: NA       HH Arranged: NA HH Agency: [facility] Date HH Agency Contacted: 11/21/23   Representative spoke with at [facility] Agency: [place]  Social Drivers of Health (SDOH) Interventions SDOH Screenings   Food Insecurity: No Food Insecurity (11/15/2023)  Housing: Low Risk  (11/15/2023)  Transportation Needs: No Transportation Needs (11/15/2023)  Utilities: Not At Risk (11/15/2023)  Depression (PHQ2-9): Low Risk  (10/09/2023)  Financial Resource Strain: Low Risk  (09/24/2023)   Received from [facility]  Physical Activity: Insufficiently Active (09/24/2023)   Received from [facility]  Social Connections: Socially Integrated (11/15/2023)  Stress: No Stress Concern Present  (09/24/2023)   Received from [facility]  Recent Concern: Stress - Stress Concern Present (09/17/2023)   Received from [facility]  Tobacco Use: Medium Risk (11/16/2023)     Readmission Risk Interventions     No data to display

## 2023-11-21 NOTE — Plan of Care (Signed)
  Problem: Activity: Goal: Ability to return to baseline activity level will improve Outcome: Progressing   Problem: Cardiovascular: Goal: Ability to achieve and maintain adequate cardiovascular perfusion will improve Outcome: Progressing   Problem: Activity: Goal: Risk for activity intolerance will decrease Outcome: Progressing   Problem: Nutrition: Goal: Adequate nutrition will be maintained Outcome: Progressing   Problem: Elimination: Goal: Will not experience complications related to bowel motility Outcome: Progressing   Problem: Pain Managment: Goal: General experience of comfort will improve and/or be controlled Outcome: Progressing   Problem: Safety: Goal: Ability to remain free from injury will improve Outcome: Progressing

## 2023-11-21 NOTE — Progress Notes (Signed)
                  [address]           [place], [place] [address]                     [phone]        5 Days Post-Op Procedure(s) (LRB): CORONARY ARTERY BYPASS GRAFTING (CABG) TIMES THREE USING LEFT INTERNAL MAMMARY ARTERY AND ENDOSCOPICALLY HARVESTED RIGHT GREATER SAPHENOUS VEIN (N/A) ECHOCARDIOGRAM, TRANSESOPHAGEAL, INTRAOPERATIVE (N/A)  Subjective: Patient without complaints this am. He is looking forward to going home.  Objective: Vital signs in last 24 hours: Temp:  [97.7 F (36.5 C)-99.2 F (37.3 C)] 98.3 F (36.8 C) (09/09 0447) Pulse Rate:  [72-91] 81 (09/08 2003) Cardiac Rhythm: Normal sinus rhythm (09/08 2000) Resp:  [13-27] 20 (09/09 0447) BP: (106-142)/(49-89) 142/89 (09/09 0447) SpO2:  [93 %-97 %] 94 % (09/09 0447)  Pre op weight 97.5 kg Current Weight  11/20/23 99.2 kg      Intake/Output from previous day: 09/08 0701 - 09/09 0700 In: 10 [I.V.:10] Out: 450 [Urine:450]   Physical Exam:  Cardiovascular: RRR Pulmonary: Clear to auscultation bilaterally Abdomen: Soft, non tender, bowel sounds present. Extremities: Mild bilateral lower extremity edema R>L Wounds: Sternal and RLE wounds are clean and dry.  No erythema or signs of infection.  Lab Results: CBC: Recent Labs    11/19/23 0742 11/20/23 0338  WBC 6.1 5.2  HGB 9.1* 8.9*  HCT 26.6* 26.5*  PLT 101* 115*   BMET:  Recent Labs    11/19/23 0742 11/20/23 0338  NA 135 136  K 4.3 4.3  CL 101 102  CO2 25 25  GLUCOSE 149* 106*  BUN 27* 21  CREATININE 1.08 0.96  CALCIUM  7.8* 7.9*    PT/INR:  Lab Results  Component Value Date   INR 1.4 (H) 11/16/2023   ABG:  INR: Will add last result for INR, ABG once components are confirmed Will add last 4 CBG results once components are confirmed  Assessment/Plan: TRAOPERATIVE (N/A) CV-Previous a fib. Continues to maintain SR. On Amiodarone  400 mg bid and Lopressor  25 mg bid. Will restart low dose Lisinopril  once Lasix   finished Pulmonary-on room air.  Encourage incentive spirometer. Above pre op weight, requires further diuresis-on oral Lasix  40 mg daily Expected post op blood loss anemia-H and H yesterday stable at 8.9 and 26.5 5. Mild thrombocytopenia-Last platelets up to 115,000 6. Disposition-discharge     Azaela Caracci M ZimmermanPA-C 6:54 AM

## 2023-11-21 NOTE — Progress Notes (Signed)
 Patient verbally understands discharge instructions.  PIV removed pressure dressing intact.  Tele removed by Primary RN. TOC meds then Volunteer to [employer] A.  Wife at bedside.

## 2023-11-21 NOTE — Care Management Important Message (Signed)
 Important Message  Patient Details  Name: Anthony Boone MRN: [id number] Date of Birth: 01/07/1956   Important Message Given:  Yes - Medicare IM     Vonzell Arrie Sharps 11/21/2023, 9:01 AM

## 2023-11-22 ENCOUNTER — Telehealth: Payer: Self-pay

## 2023-11-22 NOTE — Transitions of Care (Post Inpatient/ED Visit) (Signed)
 11/22/2023  Name: Anthony Boone MRN: [id number] DOB: [birth year]  Today's TOC FU Call Status: Today's TOC FU Call Status:: Successful TOC FU Call Completed TOC FU Call Complete Date: 11/22/23 Patient's Name and Date of Birth confirmed.  Transition Care Management Follow-up Telephone Call Date of Discharge: 11/21/23 Discharge Facility: [facility]) Type of Discharge: Inpatient Admission Primary Inpatient Discharge Diagnosis:: CABG X 3 How have you been since you were released from the hospital?: Better Any questions or concerns?: No  Items Reviewed: Did you receive and understand the discharge instructions provided?: Yes Medications obtained,verified, and reconciled?: Yes (Medications Reviewed) Any new allergies since your discharge?: No Dietary orders reviewed?: Yes Type of Diet Ordered:: Heart Healthy Do you have support at home?: Yes People in Home [RPT]: spouse Name of Support/Comfort Primary Source: Deitra Guest  Medications Reviewed Today: Medications Reviewed Today     Reviewed by Moises Reusing, RN (Case Manager) on 11/22/23 at 1429  Med List Status: <None>   Medication Order Taking? Sig Documenting Provider Last Dose Status Informant  amiodarone  (PACERONE ) 200 MG tablet [id number]  Take 2 tablets (400mg  total) twice daily for 2 days; then take 1 tablet (200mg  total) twice daily for 7 days; then take 1 tablet (200mg  toal) once daily thereafter. Zimmerman, Donielle M, PA-C  Active   aspirin  EC 325 MG tablet [id number]  Take 1 tablet (325 mg total) by mouth daily. Zimmerman, Donielle M, PA-C  Active   furosemide  (LASIX ) 40 MG tablet [id number]  Take 1 tablet (40 mg total) by mouth daily. For 3 days then stop Zimmerman, Donielle M, PA-C  Active   lisinopril  (ZESTRIL ) 20 MG tablet [id number]  Take 1 tablet (20 mg total) by mouth daily for blood pressure Dwan Aldo M, PA-C  Active   metoprolol  tartrate (LOPRESSOR ) 25 MG tablet [id number]  Take 1 tablet (25 mg total)  by mouth 2 (two) times daily. Dwan Aldo M, PA-C  Active   potassium chloride  SA (KLOR-CON  M) 20 MEQ tablet [id number]  Take 1 tablet (20 mEq total) by mouth daily. For 3 days then stop Zimmerman, Donielle M, PA-C  Active   rosuvastatin  (CRESTOR ) 20 MG tablet [id number]  Take 1 tablet (20 mg total) by mouth daily. Almarie Waddell NOVAK, NP  Active Self  sildenafil  (VIAGRA ) 50 MG tablet [id number]  Take 1 tablet (50 mg total) by mouth as needed for erectile dysfunction. Take 1 hour before sexual intercourse. Almarie Waddell NOVAK, NP  Active Self  traMADol  (ULTRAM ) 50 MG tablet [id number]  Take 1 tablet (50 mg total) by mouth every 6 (six) hours as needed for moderate pain (pain score 4-6).  Patient not taking: Reported on 11/22/2023   Zimmerman, Donielle M, PA-C  Active             Home Care and Equipment/Supplies: Were Home Health Services Ordered?: No Any new equipment or medical supplies ordered?: No  Functional Questionnaire: Do you need assistance with bathing/showering or dressing?: No Do you need assistance with meal preparation?: No Do you need assistance with eating?: No Do you have difficulty maintaining continence: No Do you need assistance with getting out of bed/getting out of a chair/moving?: No Do you have difficulty managing or taking your medications?: No  Follow up appointments reviewed: PCP Follow-up appointment confirmed?: Yes Date of PCP follow-up appointment?: 12/18/23 Follow-up Provider: Waddell Almarie Specialist [facility] Follow-up appointment confirmed?: Yes Date of Specialist follow-up appointment?: 11/30/23 Follow-Up Specialty Provider:: Luwana Su Do you need transportation to your follow-up appointment?:  No Do you understand care options if your condition(s) worsen?: Yes-patient verbalized understanding  SDOH Interventions Today    Flowsheet Row Most Recent Value  SDOH Interventions   Food Insecurity Interventions Intervention Not Indicated  Housing Interventions  Intervention Not Indicated  Transportation Interventions Intervention Not Indicated  Utilities Interventions Intervention Not Indicated    Medford Balboa, BSN, RN [place]  VBCI - Population Health RN Care Manager [phone]

## 2023-11-23 ENCOUNTER — Emergency Department (HOSPITAL_COMMUNITY)

## 2023-11-23 ENCOUNTER — Other Ambulatory Visit: Payer: Self-pay

## 2023-11-23 ENCOUNTER — Telehealth: Payer: Self-pay | Admitting: Neurology

## 2023-11-23 ENCOUNTER — Observation Stay (HOSPITAL_COMMUNITY)
Admission: EM | Admit: 2023-11-23 | Discharge: 2023-11-24 | Disposition: A | Discharge status: Home / Self Care | Attending: Family Medicine | Admitting: Family Medicine

## 2023-11-23 ENCOUNTER — Encounter (HOSPITAL_COMMUNITY): Payer: Self-pay

## 2023-11-23 DIAGNOSIS — E66811 Obesity, class 1: Secondary | ICD-10-CM | POA: Insufficient documentation

## 2023-11-23 DIAGNOSIS — Z79899 Other long term (current) drug therapy: Secondary | ICD-10-CM | POA: Insufficient documentation

## 2023-11-23 DIAGNOSIS — J90 Pleural effusion, not elsewhere classified: Secondary | ICD-10-CM | POA: Diagnosis not present

## 2023-11-23 DIAGNOSIS — R55 Syncope and collapse: Principal | ICD-10-CM | POA: Insufficient documentation

## 2023-11-23 DIAGNOSIS — Z951 Presence of aortocoronary bypass graft: Secondary | ICD-10-CM | POA: Diagnosis not present

## 2023-11-23 DIAGNOSIS — E782 Mixed hyperlipidemia: Secondary | ICD-10-CM

## 2023-11-23 DIAGNOSIS — I2510 Atherosclerotic heart disease of native coronary artery without angina pectoris: Secondary | ICD-10-CM | POA: Insufficient documentation

## 2023-11-23 DIAGNOSIS — E785 Hyperlipidemia, unspecified: Secondary | ICD-10-CM | POA: Diagnosis present

## 2023-11-23 DIAGNOSIS — I10 Essential (primary) hypertension: Secondary | ICD-10-CM | POA: Insufficient documentation

## 2023-11-23 DIAGNOSIS — Z6831 Body mass index (BMI) 31.0-31.9, adult: Secondary | ICD-10-CM | POA: Diagnosis not present

## 2023-11-23 DIAGNOSIS — S0990XA Unspecified injury of head, initial encounter: Secondary | ICD-10-CM | POA: Diagnosis not present

## 2023-11-23 DIAGNOSIS — I480 Paroxysmal atrial fibrillation: Secondary | ICD-10-CM | POA: Diagnosis not present

## 2023-11-23 DIAGNOSIS — J9811 Atelectasis: Secondary | ICD-10-CM | POA: Diagnosis not present

## 2023-11-23 DIAGNOSIS — E669 Obesity, unspecified: Secondary | ICD-10-CM | POA: Insufficient documentation

## 2023-11-23 DIAGNOSIS — Z959 Presence of cardiac and vascular implant and graft, unspecified: Secondary | ICD-10-CM | POA: Insufficient documentation

## 2023-11-23 DIAGNOSIS — S199XXA Unspecified injury of neck, initial encounter: Secondary | ICD-10-CM | POA: Diagnosis not present

## 2023-11-23 LAB — CBC WITH DIFFERENTIAL/PLATELET
Abs Immature Granulocytes: 0.09 K/uL — ABNORMAL HIGH (ref 0.00–0.07)
Basophils Absolute: 0.1 K/uL (ref 0.0–0.1)
Basophils Relative: 1 %
Eosinophils Absolute: 0.2 K/uL (ref 0.0–0.5)
Eosinophils Relative: 2 %
HCT: 33.7 % — ABNORMAL LOW (ref 39.0–52.0)
Hemoglobin: 11.2 g/dL — ABNORMAL LOW (ref 13.0–17.0)
Immature Granulocytes: 1 %
Lymphocytes Relative: 13 %
Lymphs Abs: 1 K/uL (ref 0.7–4.0)
MCH: 30.4 pg (ref 26.0–34.0)
MCHC: 33.2 g/dL (ref 30.0–36.0)
MCV: 91.6 fL (ref 80.0–100.0)
Monocytes Absolute: 0.6 K/uL (ref 0.1–1.0)
Monocytes Relative: 8 %
Neutro Abs: 5.6 K/uL (ref 1.7–7.7)
Neutrophils Relative %: 75 %
Platelets: 225 K/uL (ref 150–400)
RBC: 3.68 MIL/uL — ABNORMAL LOW (ref 4.22–5.81)
RDW: 14.2 % (ref 11.5–15.5)
WBC: 7.5 K/uL (ref 4.0–10.5)
nRBC: 0 % (ref 0.0–0.2)

## 2023-11-23 LAB — TROPONIN I (HIGH SENSITIVITY)
Troponin I (High Sensitivity): 14 ng/L (ref <18)
Troponin I (High Sensitivity): 13 ng/L (ref <18)

## 2023-11-23 LAB — COMPREHENSIVE METABOLIC PANEL WITH GFR
ALT: 83 U/L — ABNORMAL HIGH (ref 0–44)
AST: 67 U/L — ABNORMAL HIGH (ref 15–41)
Albumin: 3.7 g/dL (ref 3.5–5.0)
Alkaline Phosphatase: 61 U/L (ref 38–126)
Anion gap: 13 (ref 5–15)
BUN: 22 mg/dL (ref 8–23)
CO2: 23 mmol/L (ref 22–32)
Calcium: 8.8 mg/dL — ABNORMAL LOW (ref 8.9–10.3)
Chloride: 101 mmol/L (ref 98–111)
Creatinine, Ser: 1.33 mg/dL — ABNORMAL HIGH (ref 0.61–1.24)
GFR, Estimated: 59 mL/min — ABNORMAL LOW (ref >60)
Glucose, Bld: 167 mg/dL — ABNORMAL HIGH (ref 70–99)
Potassium: 4.9 mmol/L (ref 3.5–5.1)
Sodium: 137 mmol/L (ref 135–145)
Total Bilirubin: 0.9 mg/dL (ref 0.0–1.2)
Total Protein: 6.9 g/dL (ref 6.5–8.1)

## 2023-11-23 LAB — I-STAT CHEM 8, ED
BUN: 24 mg/dL — ABNORMAL HIGH (ref 8–23)
Calcium, Ion: 1.04 mmol/L — ABNORMAL LOW (ref 1.15–1.40)
Chloride: 101 mmol/L (ref 98–111)
Creatinine, Ser: 1.4 mg/dL — ABNORMAL HIGH (ref 0.61–1.24)
Glucose, Bld: 169 mg/dL — ABNORMAL HIGH (ref 70–99)
HCT: 31 % — ABNORMAL LOW (ref 39.0–52.0)
Hemoglobin: 10.5 g/dL — ABNORMAL LOW (ref 13.0–17.0)
Potassium: 4.8 mmol/L (ref 3.5–5.1)
Sodium: 135 mmol/L (ref 135–145)
TCO2: 24 mmol/L (ref 22–32)

## 2023-11-23 LAB — MAGNESIUM: Magnesium: 2 mg/dL (ref 1.7–2.4)

## 2023-11-23 LAB — BRAIN NATRIURETIC PEPTIDE: B Natriuretic Peptide: 73.7 pg/mL (ref 0.0–100.0)

## 2023-11-23 LAB — HIV ANTIBODY (ROUTINE TESTING W REFLEX): HIV Screen 4th Generation wRfx: NONREACTIVE

## 2023-11-23 MED ORDER — POLYETHYLENE GLYCOL 3350 17 G PO PACK: 17.0000 g | PACK | Freq: Every day | ORAL | Status: DC | PRN

## 2023-11-23 MED ORDER — METOPROLOL TARTRATE 25 MG PO TABS
25.0000 mg | ORAL_TABLET | Freq: Two times a day (BID) | ORAL | Status: DC
  Administered 2023-11-23 – 2023-11-24 (×2): 25 mg via ORAL
  Filled 2023-11-23 (×2): qty 1

## 2023-11-23 MED ORDER — ASPIRIN 81 MG PO TBEC
81.0000 mg | DELAYED_RELEASE_TABLET | Freq: Every day | ORAL | Status: DC
  Administered 2023-11-24: 81 mg via ORAL
  Filled 2023-11-23: qty 1

## 2023-11-23 MED ORDER — ENOXAPARIN SODIUM 40 MG/0.4ML IJ SOSY
40.0000 mg | PREFILLED_SYRINGE | INTRAMUSCULAR | Status: DC
  Administered 2023-11-23: 40 mg via SUBCUTANEOUS
  Filled 2023-11-23: qty 0.4

## 2023-11-23 MED ORDER — LISINOPRIL 20 MG PO TABS
20.0000 mg | ORAL_TABLET | Freq: Every day | ORAL | Status: DC
  Administered 2023-11-24: 20 mg via ORAL
  Filled 2023-11-23: qty 1

## 2023-11-23 MED ORDER — SODIUM CHLORIDE 0.9 % IV SOLN
INTRAVENOUS | Status: AC
Start: 2023-11-23 — End: 2023-11-23

## 2023-11-23 MED ORDER — AMIODARONE HCL 200 MG PO TABS
200.0000 mg | ORAL_TABLET | Freq: Two times a day (BID) | ORAL | Status: DC
  Administered 2023-11-23 – 2023-11-24 (×2): 200 mg via ORAL
  Filled 2023-11-23 (×2): qty 1

## 2023-11-23 MED ORDER — ROSUVASTATIN CALCIUM 20 MG PO TABS
20.0000 mg | ORAL_TABLET | Freq: Every day | ORAL | Status: DC
  Administered 2023-11-24: 20 mg via ORAL
  Filled 2023-11-23: qty 1

## 2023-11-23 MED ORDER — ACETAMINOPHEN 650 MG RE SUPP: 650.0000 mg | Freq: Four times a day (QID) | RECTAL | Status: DC | PRN

## 2023-11-23 MED ORDER — ACETAMINOPHEN 325 MG PO TABS: 650.0000 mg | ORAL_TABLET | Freq: Four times a day (QID) | ORAL | Status: DC | PRN

## 2023-11-23 MED ORDER — SODIUM CHLORIDE 0.9% FLUSH
3.0000 mL | Freq: Two times a day (BID) | INTRAVENOUS | Status: DC
  Administered 2023-11-23 – 2023-11-24 (×3): 3 mL via INTRAVENOUS

## 2023-11-23 NOTE — ED Triage Notes (Signed)
 Pt states he had open heart surgery last week and he woke up in a cold sweat this morning, pt sat up on the side of couch and passed out into the floor, pt states when he woke up his nose was bleeding. Pt has no complaints at this time.

## 2023-11-23 NOTE — Progress Notes (Addendum)
 [address] [place] [address]             [phone]       Subjective:  Anthony Boone is well known to [facility].  He recently underwent CABG x 3 performed by Dr. Daniel on 11/16/2023.  His hospital course progressed without significant issues.  He did experience some brief Atrial Fibrillation which chemically converted with Amiodarone .  He was discharged home on 11/21/2023.  Patient states overall since discharge he has been doing very well.  He notes today his wife was out running some errands.  During that time he sat up on his couch and got a little whoozy he experienced and episode of coughing as well.  He got up and ultimately passed out.  He does not remember much about the incident.  Currently he denies chest pain, shortness of breath, fever.  His wife mentions there has been a small area of drainage along the top portion of his sternotomy.  Objective: Vital signs in last 24 hours: Temp:  [98.1 F (36.7 C)] 98.1 F (36.7 C) (09/11 1105) Pulse Rate:  [73-80] 73 (09/11 1400) Cardiac Rhythm: Normal sinus rhythm (09/11 1151) Resp:  [13-27] 27 (09/11 1400) BP: (133-140)/(70-74) 133/73 (09/11 1400) SpO2:  [97 %-98 %] 98 % (09/11 1400)  General appearance: alert, cooperative, no distress, and patient looks great Heart: regular rate and rhythm, S1, S2 normal, no murmur, click, rub or gallop Lungs: clear to auscultation bilaterally Abdomen: soft, non-tender; bowel sounds normal; no masses,  no organomegaly Extremities: edema none present Wound: sternotomy is C/D.[name] there is a some area of skin removal along superior portion.. no erythema/drainage present at this time.[name] EVH sites are all healing without evidence of infection  Lab Results: Recent Labs    11/23/23 1141 11/23/23 1146  WBC 7.5  --   HGB 11.2* 10.5*  HCT 33.7* 31.0*  PLT 225  --    BMET:  Recent Labs    11/23/23 1141 11/23/23 1146  NA 137 135  K 4.9 4.8  CL 101 101  CO2 23  --   GLUCOSE 167*  169*  BUN 22 24*  CREATININE 1.33* 1.40*  CALCIUM  8.8*  --     PT/INR: No results for input(s): LABPROT, INR in the last 72 hours. ABG    Component Value Date/Time   PHART 7.360 11/16/2023 2022   HCO3 25.3 11/16/2023 2022   TCO2 24 11/23/2023 1146   ACIDBASEDEF 2.0 11/16/2023 1445   O2SAT 96 11/16/2023 2022   CBG (last 3)  No results for input(s): GLUCAP in the last 72 hours.  Assessment/Plan:  S/P CABG x 3 11/15/2023 Post operative Atrial Fibrillation, maintaining NSR- continue Lopressor  at 25 mg BID, Amiodarone  at 200 mg BID Renal- creatinine up to 1.40, likely due to diuretic use and could be slightly dry.. would stop Lasix , potassium Syncope-- based on description I suspect this is likely from vasovagal episode vs. Standing up too quickly.SABRASABRAPatient will benefit from monitoring to ensure no drops in HR/BP are occurring and no further atrial fibrillation has occurred  Overall patient looks great post coronary bypass grafting.  Based on exam I do not think Echocardiogram would be indicated at this time... Dr. Daniel will come and see patient later this evening... Appreciate admission/care by medicine.  We will follow along..   LOS: 0 days    Rocky Shad, PA-C 11/23/2023 3:01 PM  Patient seen at the bedside  in the ER.  Reports he syncopized while coughing and trying to get off the couch.  Reports his BP has been normal on daily checks and has been taking all his meds, however PO intake of food and liquids has been low.  Suspect he had vasovagal episode + dehydration given bump in creatinine.  Encouraged him to increase PO intake and continue closely monitoring blood pressure. He reports otherwise feeling very well.  Appreciate excellent care by the medical team. No additional interventions recommended at this time.  Con [name], MD Cardiothoracic Surgery Pager: [phone]

## 2023-11-23 NOTE — ED Provider Notes (Signed)
 [place] EMERGENCY DEPARTMENT AT [facility] Provider Note   CSN: [id number] Arrival date & time: 11/23/23  1100     Patient presents with: Loss of Consciousness and Epistaxis   Anthony Boone is a 68 y.o. male.   Patient here after syncopal episode.  Just had open heart surgery last week was sent home a couple days ago.  Patient was lying down on the couch states that he was trying to get up by pushing down on his arm to get up off the bed next thing he knew he woke up on the floor.  He had some bleeding from his nose that has stopped.  Denies any facial pain headache neck pain.  He feels back to his normal.  He has been weak since his discharge but denies any chest pain or shortness of breath.  Denies feeling weak dizzy lightheaded prior to ending up on the floor.  He the last thing he remembers is trying to push himself up off the couch with his arm prior to waking up on the floor.  He states that he had A-fib after the surgery but is not on anticoagulation.  Does not have a defibrillator.  Denies any fever or chills numbness tingling.  The history is provided by the patient.       Prior to Admission medications   Medication Sig Start Date End Date Taking? Authorizing Provider  amiodarone  (PACERONE ) 200 MG tablet Take 2 tablets (400mg  total) twice daily for 2 days; then take 1 tablet (200mg  total) twice daily for 7 days; then take 1 tablet (200mg  toal) once daily thereafter. 11/21/23  Yes Dwan Aldo M, PA-C  ascorbic acid (VITAMIN C) 500 MG tablet Take 1,000 mg by mouth daily.   Yes [provider]  aspirin  EC 81 MG tablet Take 81 mg by mouth daily. Swallow whole.   Yes [provider]  lisinopril  (ZESTRIL ) 20 MG tablet Take 1 tablet (20 mg total) by mouth daily for blood pressure 11/25/23  Yes Dwan Aldo M, PA-C  metoprolol  tartrate (LOPRESSOR ) 25 MG tablet Take 1 tablet (25 mg total) by mouth 2 (two) times daily. 11/21/23  Yes Dwan Aldo M, PA-C  rosuvastatin  (CRESTOR ) 20 MG tablet Take 1 tablet (20 mg total) by mouth daily. 10/11/23  Yes Almarie Waddell NOVAK, NP  aspirin  EC 325 MG tablet Take 1 tablet (325 mg total) by mouth daily. Patient not taking: Reported on 11/23/2023 11/21/23   Zimmerman, Donielle M, PA-C  furosemide  (LASIX ) 40 MG tablet Take 1 tablet (40 mg total) by mouth daily. For 3 days then stop Patient not taking: Reported on 11/23/2023 11/21/23   Dwan Aldo HERO, PA-C  potassium chloride  SA (KLOR-CON  M) 20 MEQ tablet Take 1 tablet (20 mEq total) by mouth daily. For 3 days then stop Patient not taking: Reported on 11/23/2023 11/21/23   Dwan Aldo HERO, PA-C  sildenafil  (VIAGRA ) 50 MG tablet Take 1 tablet (50 mg total) by mouth as needed for erectile dysfunction. Take 1 hour before sexual intercourse. Patient not taking: Reported on 11/23/2023 10/09/23   Almarie Waddell NOVAK, NP  traMADol  (ULTRAM ) 50 MG tablet Take 1 tablet (50 mg total) by mouth every 6 (six) hours as needed for moderate pain (pain score 4-6). Patient not taking: Reported on 11/22/2023 11/21/23   Zimmerman, Donielle M, PA-C    Allergies: Patient has no known allergies.    Review of Systems  Updated Vital Signs BP 139/70   Pulse 73  Temp 98.1 F (36.7 C) (Oral)   Resp 13   SpO2 97%   Physical Exam Vitals and nursing note reviewed.  Constitutional:      General: He is not in acute distress.    Appearance: He is well-developed. He is not ill-appearing.  HENT:     Head: Normocephalic and atraumatic.     Nose: Nose normal.     Mouth/Throat:     Mouth: Mucous membranes are moist.  Eyes:     Extraocular Movements: Extraocular movements intact.     Conjunctiva/sclera: Conjunctivae normal.     Pupils: Pupils are equal, round, and reactive to light.  Cardiovascular:     Rate and Rhythm: Normal rate and regular rhythm.     Pulses: Normal pulses.     Heart sounds: Normal heart sounds. No murmur heard. Pulmonary:     Effort: Pulmonary effort  is normal. No respiratory distress.     Breath sounds: Normal breath sounds.  Abdominal:     Palpations: Abdomen is soft.     Tenderness: There is no abdominal tenderness.  Musculoskeletal:        General: No swelling.     Cervical back: Normal range of motion and neck supple.  Skin:    General: Skin is warm and dry.     Capillary Refill: Capillary refill takes less than 2 seconds.  Neurological:     General: No focal deficit present.     Mental Status: He is alert and oriented to person, place, and time.     Cranial Nerves: No cranial nerve deficit.     Sensory: No sensory deficit.     Motor: No weakness.     Coordination: Coordination normal.  Psychiatric:        Mood and Affect: Mood normal.     (all labs ordered are listed, but only abnormal results are displayed) Labs Reviewed  CBC WITH DIFFERENTIAL/PLATELET - Abnormal; Notable for the following components:      Result Value   RBC 3.68 (*)    Hemoglobin 11.2 (*)    HCT 33.7 (*)    Abs Immature Granulocytes 0.09 (*)    All other components within normal limits  COMPREHENSIVE METABOLIC PANEL WITH GFR - Abnormal; Notable for the following components:   Glucose, Bld 167 (*)    Creatinine, Ser 1.33 (*)    Calcium  8.8 (*)    AST 67 (*)    ALT 83 (*)    GFR, Estimated 59 (*)    All other components within normal limits  I-STAT CHEM 8, ED - Abnormal; Notable for the following components:   BUN 24 (*)    Creatinine, Ser 1.40 (*)    Glucose, Bld 169 (*)    Calcium , Ion 1.04 (*)    Hemoglobin 10.5 (*)    HCT 31.0 (*)    All other components within normal limits  BRAIN NATRIURETIC PEPTIDE  URINALYSIS, ROUTINE W REFLEX MICROSCOPIC  TROPONIN I (HIGH SENSITIVITY)  TROPONIN I (HIGH SENSITIVITY)    EKG: None  Radiology: CT Cervical Spine Wo Contrast Result Date: 11/23/2023 EXAM: CT CERVICAL SPINE WITHOUT CONTRAST 11/23/2023 12:02:53 PM TECHNIQUE: CT of the cervical spine was performed without the administration of  intravenous contrast. Multiplanar reformatted images are provided for review. Automated exposure control, iterative reconstruction, and/or weight based adjustment of the mA/kV was utilized to reduce the radiation dose to as low as reasonably achievable. COMPARISON: None available. CLINICAL HISTORY: Polytrauma, blunt. Pt states he had open heart  surgery last week and he woke up in a cold sweat this morning, pt sat up on the side of couch and passed out into the floor, pt states when he woke up his nose was bleeding. Pt has no complaints at this time. FINDINGS: CERVICAL SPINE: BONES AND ALIGNMENT: No acute fracture or traumatic malalignment. Straightening and slight reversal of the normal cervical lordosis is present. DEGENERATIVE CHANGES: Moderate foraminal narrowing secondary to bilateral uncovertebral spurring at C4-5. Moderate foraminal narrowing at C5-6 is worse on the right. Left foraminal narrowing is present at C6-7. Asymmetric advanced facet hypertrophy is present on the left at C2-3 resulting in a moderate left foraminal stenosis at C2-3. SOFT TISSUES: No prevertebral soft tissue swelling. IMPRESSION: 1. No acute abnormality of the cervical spine related to the provided clinical history. 2. Straightening and slight reversal of the normal cervical lordosis. 3. Moderate left foraminal stenosis at C2-3 due to asymmetric advanced facet hypertrophy on the left. 4. Moderate foraminal narrowing at C4-5 secondary to bilateral uncovertebral spurring. 5. Moderate foraminal narrowing at C5-6, worse on the right. 6. Left foraminal narrowing at C6-7. Electronically signed by: Lonni Necessary MD 11/23/2023 12:10 PM EDT RP Workstation: [id number]   CT Head Wo Contrast Result Date: 11/23/2023 EXAM: CT HEAD WITHOUT CONTRAST 11/23/2023 12:02:53 PM TECHNIQUE: CT of the head was performed without the administration of intravenous contrast. Automated exposure control, iterative reconstruction, and/or weight based  adjustment of the mA/kV was utilized to reduce the radiation dose to as low as reasonably achievable. COMPARISON: None available. CLINICAL HISTORY: Polytrauma, blunt. Table formatting from the original note was not included. Pt states he had open heart surgery last week and he woke up in a cold sweat this morning, pt sat up on the side of couch and passed out into the floor, pt states when he woke up his nose was bleeding. Pt has no complaints at this time. FINDINGS: BRAIN AND VENTRICLES: No acute hemorrhage. No evidence of acute infarct. No hydrocephalus. No extra-axial collection. No mass effect or midline shift. Atherosclerotic calcifications are present in the cavernous carotid arteries bilaterally. No hyperdense vessel is present. ORBITS: No acute abnormality. SINUSES: No acute abnormality. SOFT TISSUES AND SKULL: No acute soft tissue abnormality. No skull fracture. IMPRESSION: 1. No acute intracranial abnormality. Electronically signed by: Lonni Necessary MD 11/23/2023 12:08 PM EDT RP Workstation: [id number]   DG Chest 2 View Result Date: 11/23/2023 CLINICAL DATA:  Syncope. EXAM: CHEST - 2 VIEW COMPARISON:  November 20, 2023. FINDINGS: Stable cardiomediastinal silhouette. Status post coronary bypass graft. Right lung is clear. Minimal left pleural effusion is noted with minimal adjacent subsegmental atelectasis. Bony thorax is unremarkable. IMPRESSION: Minimal left pleural effusion is noted with minimal adjacent subsegmental atelectasis. Electronically Signed   By: Lynwood Landy Raddle M.D.   On: 11/23/2023 11:58     Procedures   Medications Ordered in the ED - No data to display                                  Medical Decision Making Amount and/or Complexity of Data Reviewed Labs: ordered. Radiology: ordered.  Risk Decision regarding hospitalization.   Anthony Boone is here after syncopal event.  Normal vitals.  No fever.  Open heart surgery last week.  Discharged this past week.  He  has been doing well at home.  No chest pain or shortness of breath but still having some fatigue and not  quite the same energy.  He was trying to get up off of the couch from a lying position he states the next thing he knew he was on the floor.  He did not feel weak lightheaded or dizzy prior to falling on the ground.  Does not really know how he went from trying to get up to being on the ground.  He had a little bit of blood coming from his nose after the fall but is not having any facial pain.  No headache or neck pain.  Denies any fevers or chills.  He has been ambulatory since the fall without any issues.  Does not have a defibrillator.  EKG shows sinus rhythm.  No ischemic changes.  Differential diagnosis could have been vasovagal type process but is not very symptomatic and there was no prodrome, could be anemia electrolyte abnormality dehydration arrhythmia.  Not having any shortness of breath is not hypoxic he is not tachycardic I doubt PE.  Ultimately will check basic labs including troponin and get a head CT neck CT chest x-ray troponin put him on telemetry.  Will let CT surgery aware but otherwise may need to consider observation stay to pursue some telemetry.  Overall lab work per my review and interpretation shows a normal white count of 7.5.  Hemoglobin stable at 11.2.  Creatinine mildly elevated at 1.3.  But electrolytes otherwise unremarkable.  Troponin normal.  BNP unremarkable.  CT scan of the head and neck are unremarkable.  Chest x-ray with no evidence of pneumonia.  May be small effusion.  Ultimately he is feeling better.  I talked with Dr. Shyrl with CT surgery.  I think it is reasonable to admit him for observation telemetry supportive care.  Given his recent surgery on to make sure this was not arrhythmia related.  Will admit to hospitalist for further care.  This chart was dictated using voice recognition software.  Despite best efforts to proofread,  errors can occur which can  change the documentation meaning.      Final diagnoses:  Syncope, unspecified syncope type    ED Discharge Orders     None          Ruthe Cornet, DO 11/23/23 1346

## 2023-11-23 NOTE — Telephone Encounter (Signed)
 Med list up-to-date in system. Will call patient to discuss any questions.   Copied from CRM [id number]. Topic: General - Other >> Nov 22, 2023  3:33 PM Sasha M wrote: Reason for CRM: pt wife called to provide updated medication:  Amiodarone  200mg /2 tablets twice daily for 2 days and then one table twice daily for 7 days and then one table daily thereafter Aspirin  EC 325mg  one a day in morning  (pt only has 81mg ) Furosemide  40mg  one tab daily for 3 days then stop Lisinopril  20mg  start on 9/13 one tablet daily Metoprolol  25mg  one table twice daily Potassium Chloride  SA one table for 3 days then stop Rosuvastatin  20mg  once daily Tramadol  50mg  once every 6 hours for pain  Pt would like to speak with provider, call back at [phone]

## 2023-11-23 NOTE — ED Notes (Signed)
 CCMD called to transfer pt to 43. RN contacted to give report.

## 2023-11-23 NOTE — Telephone Encounter (Signed)
 Called patient's wife. He just wanted to talk to Anthony Boone, but he passed out this am and they are back at [facility]. He has been admitted. I did let her know to schedule a visit when he is released. She expressed understanding.   Waddell - FYI.

## 2023-11-23 NOTE — H&P (Signed)
 History and Physical   Anthony Boone FMW:[id number] DOB: February 18, 1956 DOA: 11/23/2023  PCP: Almarie Waddell NOVAK, NP   Patient coming from: Home  Chief Complaint: Syncope  HPI: Anthony Boone is a 68 y.o. male with medical history significant of hypertension, hyperlipidemia, CAD status post CABG, paroxysmal atrial fibrillation, obesity presenting after syncopal episode at home.  Of note, patient recently admitted for CABG from 9/3-9/9.  Had short runs of A-fib postoperatively and this was converted with IV amiodarone  which was transitioned to p.o. amiodarone  prior to discharge.  Had had some fatigue at home but otherwise had been feeling well since discharge.  [It was initially communicated that patient had been laying on the couch and was trying to get up by pushing down with his arm in the next and he knew he woke on the floor.  And that it had no prodrome. ]  Upon further questioning, patient reports that he did have some diaphoresis prior to the event and instead of pushing off the couch with his arm (as he had been instructed not to do this postoperatively); he was actually trying to hook his legs to help pull himself up.  It was at this point that he lost consciousness and presumably rolled off the couch.  Had some blood coming from his nose when he awoke, but this resolved.  No other injuries reported.    Denies fevers, chills, chest pain, shortness of breath, abdominal pain, constipation, diarrhea, nausea, vomiting.  ED Course: Vital signs in the ED notable for positive orthostatic vital signs with greater than 20 drop in systolic blood pressure from sitting to standing and 20 point drop from sitting to standing at 3 minutes.  Patient was asymptomatic the whole time however. Lab workup included CMP with creatinine 1.33 near baseline at 1.11, glucose 167, calcium  8.8, AST 61, ALT 83.  CBC with hemoglobin 11.2.  BMP normal.  Troponin negative with repeat pending.  Chest x-ray showed minimal  left pleural effusion.  CT head showed no acute abnormality.  CT C-spine showed no acute dramality but did show degenerative disease with moderate foraminal narrowing at this C2-3, 4-5, 5-6.  Cardiothoracic surgery was consulted in the ED and will see the patient.  Review of Systems: As per HPI otherwise all other systems reviewed and are negative.  Past Medical History:  Diagnosis Date   Coronary artery calcification seen on CAT scan 10/09/2023   Erectile dysfunction 01/12/2018   Former smoker 01/15/2018   Hyperlipidemia    Hypertension    Medication management 07/29/2013   Obesity (BMI 30.0-34.9) 01/12/2018   Other abnormal glucose 07/29/2013   Vitamin D  deficiency     Past Surgical History:  Procedure Laterality Date   APPENDECTOMY  1974   CORONARY ARTERY BYPASS GRAFT N/A 11/16/2023   Procedure: CORONARY ARTERY BYPASS GRAFTING (CABG) TIMES THREE USING LEFT INTERNAL MAMMARY ARTERY AND ENDOSCOPICALLY HARVESTED RIGHT GREATER SAPHENOUS VEIN;  Surgeon: Daniel Con RAMAN, MD;  Location: [facility] OR;  Service: Open Heart Surgery;  Laterality: N/A;  Add Left Radial artery   INTRAOPERATIVE TRANSESOPHAGEAL ECHOCARDIOGRAM N/A 11/16/2023   Procedure: ECHOCARDIOGRAM, TRANSESOPHAGEAL, INTRAOPERATIVE;  Surgeon: Daniel Con RAMAN, MD;  Location: [facility] OR;  Service: Open Heart Surgery;  Laterality: N/A;   LEFT HEART CATH AND CORONARY ANGIOGRAPHY N/A 11/15/2023   Procedure: LEFT HEART CATH AND CORONARY ANGIOGRAPHY;  Surgeon: Ladona Heinz, MD;  Location: [facility] INVASIVE CV LAB;  Service: Cardiovascular;  Laterality: N/A;   VASECTOMY  2005   Dr Donelle  Social History  reports that he quit smoking about 31 years ago. His smoking use included cigarettes. He started smoking about 50 years ago. He has a 43.5 pack-year smoking history. He has never used smokeless tobacco. He reports current alcohol  use. He reports that he does not use drugs.  No Known Allergies  Family History  Problem Relation Age of Onset   Heart disease  Father    Hypertension Brother   Reviewed on admission  Prior to Admission medications   Medication Sig Start Date End Date Taking? Authorizing Provider  amiodarone  (PACERONE ) 200 MG tablet Take 2 tablets (400mg  total) twice daily for 2 days; then take 1 tablet (200mg  total) twice daily for 7 days; then take 1 tablet (200mg  toal) once daily thereafter. 11/21/23  Yes Dwan Aldo M, PA-C  ascorbic acid (VITAMIN C) 500 MG tablet Take 1,000 mg by mouth daily.   Yes [provider]  aspirin  EC 81 MG tablet Take 81 mg by mouth daily. Swallow whole.   Yes [provider]  lisinopril  (ZESTRIL ) 20 MG tablet Take 1 tablet (20 mg total) by mouth daily for blood pressure 11/25/23  Yes Dwan Aldo M, PA-C  metoprolol  tartrate (LOPRESSOR ) 25 MG tablet Take 1 tablet (25 mg total) by mouth 2 (two) times daily. 11/21/23  Yes Dwan Aldo M, PA-C  rosuvastatin  (CRESTOR ) 20 MG tablet Take 1 tablet (20 mg total) by mouth daily. 10/11/23  Yes Almarie Waddell NOVAK, NP  aspirin  EC 325 MG tablet Take 1 tablet (325 mg total) by mouth daily. Patient not taking: Reported on 11/23/2023 11/21/23   Zimmerman, Donielle M, PA-C  furosemide  (LASIX ) 40 MG tablet Take 1 tablet (40 mg total) by mouth daily. For 3 days then stop Patient not taking: Reported on 11/23/2023 11/21/23   Zimmerman, Donielle M, PA-C  potassium chloride  SA (KLOR-CON  M) 20 MEQ tablet Take 1 tablet (20 mEq total) by mouth daily. For 3 days then stop Patient not taking: Reported on 11/23/2023 11/21/23   Dwan Aldo HERO, PA-C  sildenafil  (VIAGRA ) 50 MG tablet Take 1 tablet (50 mg total) by mouth as needed for erectile dysfunction. Take 1 hour before sexual intercourse. Patient not taking: Reported on 11/23/2023 10/09/23   Almarie Waddell NOVAK, NP  traMADol  (ULTRAM ) 50 MG tablet Take 1 tablet (50 mg total) by mouth every 6 (six) hours as needed for moderate pain (pain score 4-6). Patient not taking: Reported on 11/22/2023 11/21/23   Dwan Aldo HERO, PA-C    Physical Exam: Vitals:   11/23/23 1105 11/23/23 1226 11/23/23 1228  BP: (!) 140/74 139/70   Pulse: 80 73   Resp: 18 13   Temp: 98.1 F (36.7 C)    TempSrc: Oral    SpO2: 98% 98% 97%    Physical Exam Constitutional:      General: He is not in acute distress.    Appearance: Normal appearance.  HENT:     Head: Normocephalic and atraumatic.     Mouth/Throat:     Mouth: Mucous membranes are moist.     Pharynx: Oropharynx is clear.  Eyes:     Extraocular Movements: Extraocular movements intact.     Pupils: Pupils are equal, round, and reactive to light.  Cardiovascular:     Rate and Rhythm: Normal rate and regular rhythm.     Pulses: Normal pulses.     Heart sounds: Normal heart sounds.  Pulmonary:     Effort: Pulmonary effort is normal. No respiratory distress.  Breath sounds: Normal breath sounds.  Abdominal:     General: Bowel sounds are normal. There is no distension.     Palpations: Abdomen is soft.     Tenderness: There is no abdominal tenderness.  Musculoskeletal:        General: No swelling or deformity.  Skin:    General: Skin is warm and dry.  Neurological:     General: No focal deficit present.     Mental Status: Mental status is at baseline.    Labs on Admission: I have personally reviewed following labs and imaging studies  CBC: Recent Labs  Lab 11/17/23 1617 11/18/23 0400 11/19/23 0742 11/20/23 0338 11/23/23 1141 11/23/23 1146  WBC 8.5 8.7 6.1 5.2 7.5  --   NEUTROABS  --   --   --   --  5.6  --   HGB 10.3* 9.5* 9.1* 8.9* 11.2* 10.5*  HCT 30.6* 28.7* 26.6* 26.5* 33.7* 31.0*  MCV 90.5 90.5 88.7 89.5 91.6  --   PLT 104* 107* 101* 115* 225  --     Basic Metabolic Panel: Recent Labs  Lab 11/16/23 2103 11/17/23 0513 11/17/23 1617 11/18/23 0400 11/19/23 0742 11/20/23 0338 11/23/23 1141 11/23/23 1146  NA 140 138 136 138 135 136 137 135  K 4.5 4.5 4.4 4.7 4.3 4.3 4.9 4.8  CL 106 108 101 102 101 102 101 101  CO2 24  25 25 25 25 25 23   --   GLUCOSE 146* 148* 121* 120* 149* 106* 167* 169*  BUN 14 15 21  26* 27* 21 22 24*  CREATININE 1.15 1.24 1.51* 1.49* 1.08 0.96 1.33* 1.40*  CALCIUM  7.4* 7.5* 7.7* 7.6* 7.8* 7.9* 8.8*  --   MG 3.2* 2.9* 3.0*  --   --   --   --   --     GFR: Estimated Creatinine Clearance: 58.4 mL/min (A) (by C-G formula based on SCr of 1.4 mg/dL (H)).  Liver Function Tests: Recent Labs  Lab 11/23/23 1141  AST 67*  ALT 83*  ALKPHOS 61  BILITOT 0.9  PROT 6.9  ALBUMIN  3.7    Urine analysis:    Component Value Date/Time   COLORURINE YELLOW 12/23/2022 1117   APPEARANCEUR CLEAR 12/23/2022 1117   LABSPEC 1.017 12/23/2022 1117   PHURINE 6.0 12/23/2022 1117   GLUCOSEU NEGATIVE 12/23/2022 1117   HGBUR NEGATIVE 12/23/2022 1117   BILIRUBINUR NEG 07/10/2014 1008   KETONESUR NEGATIVE 12/23/2022 1117   PROTEINUR NEGATIVE 12/23/2022 1117   UROBILINOGEN 0.2 07/10/2014 1008   NITRITE NEGATIVE 12/23/2022 1117   LEUKOCYTESUR NEGATIVE 12/23/2022 1117    Radiological Exams on Admission: CT Cervical Spine Wo Contrast Result Date: 11/23/2023 EXAM: CT CERVICAL SPINE WITHOUT CONTRAST 11/23/2023 12:02:53 PM TECHNIQUE: CT of the cervical spine was performed without the administration of intravenous contrast. Multiplanar reformatted images are provided for review. Automated exposure control, iterative reconstruction, and/or weight based adjustment of the mA/kV was utilized to reduce the radiation dose to as low as reasonably achievable. COMPARISON: None available. CLINICAL HISTORY: Polytrauma, blunt. Pt states he had open heart surgery last week and he woke up in a cold sweat this morning, pt sat up on the side of couch and passed out into the floor, pt states when he woke up his nose was bleeding. Pt has no complaints at this time. FINDINGS: CERVICAL SPINE: BONES AND ALIGNMENT: No acute fracture or traumatic malalignment. Straightening and slight reversal of the normal cervical lordosis is present.  DEGENERATIVE CHANGES: Moderate foraminal narrowing  secondary to bilateral uncovertebral spurring at C4-5. Moderate foraminal narrowing at C5-6 is worse on the right. Left foraminal narrowing is present at C6-7. Asymmetric advanced facet hypertrophy is present on the left at C2-3 resulting in a moderate left foraminal stenosis at C2-3. SOFT TISSUES: No prevertebral soft tissue swelling. IMPRESSION: 1. No acute abnormality of the cervical spine related to the provided clinical history. 2. Straightening and slight reversal of the normal cervical lordosis. 3. Moderate left foraminal stenosis at C2-3 due to asymmetric advanced facet hypertrophy on the left. 4. Moderate foraminal narrowing at C4-5 secondary to bilateral uncovertebral spurring. 5. Moderate foraminal narrowing at C5-6, worse on the right. 6. Left foraminal narrowing at C6-7. Electronically signed by: Lonni Necessary MD 11/23/2023 12:10 PM EDT RP Workstation: [id number]   CT Head Wo Contrast Result Date: 11/23/2023 EXAM: CT HEAD WITHOUT CONTRAST 11/23/2023 12:02:53 PM TECHNIQUE: CT of the head was performed without the administration of intravenous contrast. Automated exposure control, iterative reconstruction, and/or weight based adjustment of the mA/kV was utilized to reduce the radiation dose to as low as reasonably achievable. COMPARISON: None available. CLINICAL HISTORY: Polytrauma, blunt. Table formatting from the original note was not included. Pt states he had open heart surgery last week and he woke up in a cold sweat this morning, pt sat up on the side of couch and passed out into the floor, pt states when he woke up his nose was bleeding. Pt has no complaints at this time. FINDINGS: BRAIN AND VENTRICLES: No acute hemorrhage. No evidence of acute infarct. No hydrocephalus. No extra-axial collection. No mass effect or midline shift. Atherosclerotic calcifications are present in the cavernous carotid arteries bilaterally. No hyperdense vessel  is present. ORBITS: No acute abnormality. SINUSES: No acute abnormality. SOFT TISSUES AND SKULL: No acute soft tissue abnormality. No skull fracture. IMPRESSION: 1. No acute intracranial abnormality. Electronically signed by: Lonni Necessary MD 11/23/2023 12:08 PM EDT RP Workstation: [id number]   DG Chest 2 View Result Date: 11/23/2023 CLINICAL DATA:  Syncope. EXAM: CHEST - 2 VIEW COMPARISON:  November 20, 2023. FINDINGS: Stable cardiomediastinal silhouette. Status post coronary bypass graft. Right lung is clear. Minimal left pleural effusion is noted with minimal adjacent subsegmental atelectasis. Bony thorax is unremarkable. IMPRESSION: Minimal left pleural effusion is noted with minimal adjacent subsegmental atelectasis. Electronically Signed   By: Lynwood Landy Raddle M.D.   On: 11/23/2023 11:58   EKG: Independently reviewed.  Sinus rhythm at 80 bpm.  Nonspecific T wave changes multiple leads.  Minimal baseline wander in V4 5.  Assessment/Plan Principal Problem:   Syncope and collapse Active Problems:   Hyperlipidemia   Hypertension   Obesity (BMI 30.0-34.9)   Coronary artery disease   S/P CABG x 3   Paroxysmal atrial fibrillation (HCC)   Syncope > Patient presenting after syncope that occurred when he was attempting to push-up from laying on the couch.  Some diaphoresis prior. Mild elevation in Cr. > Concern for possible arrhythmia given recent CABG and initially reported lack of prodrome though now patient reports diaphoresis.  Had some postop atrial fibrillation that resolved with IV amiodarone , now on p.o. amiodarone . > Did have clinically positive orthostatic vital signs in the ED including systolic drop of 20mmHg at 3 minutes; though he remained asymptomatic. > Cardiothoracic surgery has been consulted in the ED and will see the patient per EDP. - Monitor on telemetry overnight - Appreciate CT surgery recommendations and assistance - Check magnesium  - Hold off on repeat echo as  this was  recently done perioperatively - Orthostatic vital signs done in the ED, repeat in AM. - Gentle IVF (total of 500cc), Stop Lasix . - Supportive care  CAD Status post CABG x 3 > Recent admission 9/3-9/9 for CABG x 3.  Short runs of A-fib postop which resolved with amiodarone , now on p.o. amiodarone . - Appreciate CT surgery input as above - Continue home ASA, lisinopril , metoprolol , rosuvastatin   Hypertension - Continue home lisinopril , metoprolol   Hyperlipidemia - Continue rosuvastatin   Paroxysmal defibrillation - Continue home amiodarone , metoprolol , ASA - Not currently on anticoagulation  Obesity - Noted  DVT prophylaxis: Lovenox  Code Status:   Full Family Communication:  None on admission  Disposition Plan:   Patient is from:  Home  Anticipated DC to:  Home  Anticipated DC date:  1 to 2 days  Anticipated DC barriers: None  Consults called:  CT surgery Admission status:  Observation, telemetry  Severity of Illness: The appropriate patient status for this patient is OBSERVATION. Observation status is judged to be reasonable and necessary in order to provide the required intensity of service to ensure the patient's safety. The patient's presenting symptoms, physical exam findings, and initial radiographic and laboratory data in the context of their medical condition is felt to place them at decreased risk for further clinical deterioration. Furthermore, it is anticipated that the patient will be medically stable for discharge from the hospital within 2 midnights of admission.    Marsa KATHEE Scurry MD [facility]  How to contact the [facility] Attending or Consulting provider 7A - 7P or covering provider during after hours 7P -7A, for this patient?   Check the care team in [facility] and look for a) attending/consulting [facility] provider listed and b) the [facility] team listed Log into [email or web address] and use [place]'s universal password to access. If you do not have the password,  please contact the hospital operator. Locate the [facility] provider you are looking for under [facility] and page to a number that you can be directly reached. If you still have difficulty reaching the provider, please page the [facility] (Director on Call) for the Hospitalists listed on amion for assistance.  11/23/2023, 2:19 PM

## 2023-11-24 ENCOUNTER — Telehealth (HOSPITAL_COMMUNITY): Payer: Self-pay

## 2023-11-24 DIAGNOSIS — R55 Syncope and collapse: Secondary | ICD-10-CM | POA: Diagnosis not present

## 2023-11-24 LAB — GLUCOSE, CAPILLARY: Glucose-Capillary: 90 mg/dL (ref 70–99)

## 2023-11-24 LAB — CBC
HCT: 31.2 % — ABNORMAL LOW (ref 39.0–52.0)
Hemoglobin: 10.6 g/dL — ABNORMAL LOW (ref 13.0–17.0)
MCH: 30.2 pg (ref 26.0–34.0)
MCHC: 34 g/dL (ref 30.0–36.0)
MCV: 88.9 fL (ref 80.0–100.0)
Platelets: 200 K/uL (ref 150–400)
RBC: 3.51 MIL/uL — ABNORMAL LOW (ref 4.22–5.81)
RDW: 14.2 % (ref 11.5–15.5)
WBC: 6.2 K/uL (ref 4.0–10.5)
nRBC: 0 % (ref 0.0–0.2)

## 2023-11-24 LAB — COMPREHENSIVE METABOLIC PANEL WITH GFR
ALT: 66 U/L — ABNORMAL HIGH (ref 0–44)
AST: 38 U/L (ref 15–41)
Albumin: 3 g/dL — ABNORMAL LOW (ref 3.5–5.0)
Alkaline Phosphatase: 55 U/L (ref 38–126)
Anion gap: 14 (ref 5–15)
BUN: 22 mg/dL (ref 8–23)
CO2: 22 mmol/L (ref 22–32)
Calcium: 8.7 mg/dL — ABNORMAL LOW (ref 8.9–10.3)
Chloride: 101 mmol/L (ref 98–111)
Creatinine, Ser: 1.17 mg/dL (ref 0.61–1.24)
GFR, Estimated: >60 mL/min (ref >60)
Glucose, Bld: 106 mg/dL — ABNORMAL HIGH (ref 70–99)
Potassium: 4.6 mmol/L (ref 3.5–5.1)
Sodium: 137 mmol/L (ref 135–145)
Total Bilirubin: 1.1 mg/dL (ref 0.0–1.2)
Total Protein: 6.1 g/dL — ABNORMAL LOW (ref 6.5–8.1)

## 2023-11-24 NOTE — Progress Notes (Addendum)
                  [address]           [place], [place] [address]                     [phone]            Subjective: Patient states he is feeling fairly well this am;he has no specific complaint. He denies any dizziness (episode prior to admission, likely due to vasovagal and dehydration)  Objective: Vital signs in last 24 hours: Temp:  [97.9 F (36.6 C)-98.2 F (36.8 C)] 98.1 F (36.7 C) (09/12 0530) Pulse Rate:  [71-80] 73 (09/12 0530) Cardiac Rhythm: Normal sinus rhythm (09/11 1900) Resp:  [13-27] 18 (09/12 0530) BP: (117-149)/(66-92) 129/73 (09/12 0530) SpO2:  [96 %-98 %] 97 % (09/12 0530) Weight:  [93.8 kg] 93.8 kg (09/12 0500)   Current Weight  11/24/23 93.8 kg      Intake/Output from previous day: 09/11 0701 - 09/12 0700 In: 227.1 [P.O.:200; I.V.:27.1] Out: -    Physical Exam:  Cardiovascular: RRR Pulmonary: Clear to auscultation bilaterally Abdomen: Soft, non tender, bowel sounds present. Extremities: No LE lower extremity edema. Ecchymosis right thigh Wounds: Sternal and RLE wounds are clean and dry.  No erythema or signs of infection. No drainage from proximal sternal wound this am  Lab Results: CBC: Recent Labs    11/23/23 1141 11/23/23 1146 11/24/23 0559  WBC 7.5  --  6.2  HGB 11.2* 10.5* 10.6*  HCT 33.7* 31.0* 31.2*  PLT 225  --  200   BMET:  Recent Labs    11/23/23 1141 11/23/23 1146  NA 137 135  K 4.9 4.8  CL 101 101  CO2 23  --   GLUCOSE 167* 169*  BUN 22 24*  CREATININE 1.33* 1.40*  CALCIUM  8.8*  --     PT/INR:  Lab Results  Component Value Date   INR 1.4 (H) 11/16/2023   ABG:  INR: Will add last result for INR, ABG once components are confirmed Will add last 4 CBG results once components are confirmed  Assessment/Plan:  1. CV - Post op atrial fibrillation. He has been maintaining SR. On Amiodarone  200 mg bid, Lopressor  25 mg bid, and Lisinopril  20 mg daily.  2.  Pulmonary - On room air. 3.   Creatinine yesterday 1.33. Await this am's result. If creatinine worsens, can stop Lisinopril  (he is NOT on a diuretic) 4. Appreciate medicine's assistance. Follow up with Dr. Daniel on 11/30/2023  Kyla HERO ZimmermanPA-C 7:18 AM  Looks great this morning.  Creatinine back down to baseline.  Ambulated multiple times without dizziness, BP stable and has been in NSR on telemetry.  Weight yesterday down 6 kg from discharge, suspect at least some component of dehydration. Spoke with patient about taking 2-3 protein shakes a day and no additional diuretic.  Appreciate medicine's excellent management of patient. OK for discharge from my standpoint and I will see him in clinic next Thursday.  Con Daniel, MD Cardiothoracic Surgery Pager: [phone]    [address], [place] [address]             [phone]

## 2023-11-24 NOTE — Plan of Care (Signed)

## 2023-11-24 NOTE — Discharge Summary (Signed)
 Physician Discharge Summary   Patient: Anthony Boone MRN: [id number] DOB: Mar 19, 1955  Admit date:     11/23/2023  Discharge date: {dischdate:26783}  Discharge Physician: Elgin Lam   PCP: Almarie Waddell NOVAK, NP   Recommendations at discharge:  {Tip this will not be part of the note when signed- Example include specific recommendations for outpatient follow-up, pending tests to follow-up on. (Optional):26781}  ***  Discharge Diagnoses: Principal Problem:   Syncope and collapse Active Problems:   Hyperlipidemia   Hypertension   Obesity (BMI 30.0-34.9)   Coronary artery disease   S/P CABG x 3   Paroxysmal atrial fibrillation (HCC)  Resolved Problems:   * No resolved hospital problems. [facility] Course: No notes on file  Assessment and Plan: No notes have been filed under this hospital service. Service: Hospitalist     {Tip this will not be part of the note when signed Body mass index is 31.46 kg/m. , ,  (Optional):26781}  {(NOTE) Pain control PDMP Statment (Optional):26782} Consultants: *** Procedures performed: ***  Disposition: {Plan; Disposition:26390} Diet recommendation:  {Diet_Plan:26776} DISCHARGE MEDICATION: Allergies as of 11/24/2023   No Known Allergies      Medication List     STOP taking these medications    furosemide  40 MG tablet Commonly known as: LASIX    potassium chloride  SA 20 MEQ tablet Commonly known as: KLOR-CON  M   sildenafil  50 MG tablet Commonly known as: Viagra    traMADol  50 MG tablet Commonly known as: ULTRAM        TAKE these medications    amiodarone  200 MG tablet Commonly known as: PACERONE  Take 2 tablets (400mg  total) twice daily for 2 days; then take 1 tablet (200mg  total) twice daily for 7 days; then take 1 tablet (200mg  toal) once daily thereafter.   ascorbic acid 500 MG tablet Commonly known as: VITAMIN C Take 1,000 mg by mouth daily.   aspirin  EC 81 MG tablet Take 81 mg by mouth daily. Swallow  whole. What changed: Another medication with the same name was removed. Continue taking this medication, and follow the directions you see here.   lisinopril  20 MG tablet Commonly known as: ZESTRIL  Take 1 tablet (20 mg total) by mouth daily for blood pressure Start taking on: November 25, 2023   metoprolol  tartrate 25 MG tablet Commonly known as: LOPRESSOR  Take 1 tablet (25 mg total) by mouth 2 (two) times daily.   rosuvastatin  20 MG tablet Commonly known as: CRESTOR  Take 1 tablet (20 mg total) by mouth daily.        Follow-up Information     Almarie Waddell NOVAK, NP. Schedule an appointment as soon as possible for a visit in 1 week(s).   Specialties: Family Medicine, Emergency Medicine Why: For hospital follow-up Contact information: [address] [place] [address] [phone]                Discharge Exam: Anthony Boone   11/24/23 0500  Weight: 93.8 kg   ***  Condition at discharge: {DC Condition:26389}  The results of significant diagnostics from this hospitalization (including imaging, microbiology, ancillary and laboratory) are listed below for reference.   Imaging Studies: CT Cervical Spine Wo Contrast Result Date: 11/23/2023 EXAM: CT CERVICAL SPINE WITHOUT CONTRAST 11/23/2023 12:02:53 PM TECHNIQUE: CT of the cervical spine was performed without the administration of intravenous contrast. Multiplanar reformatted images are provided for review. Automated exposure control, iterative reconstruction, and/or weight based adjustment of the mA/kV was utilized to reduce  the radiation dose to as low as reasonably achievable. COMPARISON: None available. CLINICAL HISTORY: Polytrauma, blunt. Pt states he had open heart surgery last week and he woke up in a cold sweat this morning, pt sat up on the side of couch and passed out into the floor, pt states when he woke up his nose was bleeding. Pt has no complaints at this time. FINDINGS: CERVICAL SPINE:  BONES AND ALIGNMENT: No acute fracture or traumatic malalignment. Straightening and slight reversal of the normal cervical lordosis is present. DEGENERATIVE CHANGES: Moderate foraminal narrowing secondary to bilateral uncovertebral spurring at C4-5. Moderate foraminal narrowing at C5-6 is worse on the right. Left foraminal narrowing is present at C6-7. Asymmetric advanced facet hypertrophy is present on the left at C2-3 resulting in a moderate left foraminal stenosis at C2-3. SOFT TISSUES: No prevertebral soft tissue swelling. IMPRESSION: 1. No acute abnormality of the cervical spine related to the provided clinical history. 2. Straightening and slight reversal of the normal cervical lordosis. 3. Moderate left foraminal stenosis at C2-3 due to asymmetric advanced facet hypertrophy on the left. 4. Moderate foraminal narrowing at C4-5 secondary to bilateral uncovertebral spurring. 5. Moderate foraminal narrowing at C5-6, worse on the right. 6. Left foraminal narrowing at C6-7. Electronically signed by: Lonni Necessary MD 11/23/2023 12:10 PM EDT RP Workstation: [id number]   CT Head Wo Contrast Result Date: 11/23/2023 EXAM: CT HEAD WITHOUT CONTRAST 11/23/2023 12:02:53 PM TECHNIQUE: CT of the head was performed without the administration of intravenous contrast. Automated exposure control, iterative reconstruction, and/or weight based adjustment of the mA/kV was utilized to reduce the radiation dose to as low as reasonably achievable. COMPARISON: None available. CLINICAL HISTORY: Polytrauma, blunt. Table formatting from the original note was not included. Pt states he had open heart surgery last week and he woke up in a cold sweat this morning, pt sat up on the side of couch and passed out into the floor, pt states when he woke up his nose was bleeding. Pt has no complaints at this time. FINDINGS: BRAIN AND VENTRICLES: No acute hemorrhage. No evidence of acute infarct. No hydrocephalus. No extra-axial collection.  No mass effect or midline shift. Atherosclerotic calcifications are present in the cavernous carotid arteries bilaterally. No hyperdense vessel is present. ORBITS: No acute abnormality. SINUSES: No acute abnormality. SOFT TISSUES AND SKULL: No acute soft tissue abnormality. No skull fracture. IMPRESSION: 1. No acute intracranial abnormality. Electronically signed by: Lonni Necessary MD 11/23/2023 12:08 PM EDT RP Workstation: [id number]   DG Chest 2 View Result Date: 11/23/2023 CLINICAL DATA:  Syncope. EXAM: CHEST - 2 VIEW COMPARISON:  November 20, 2023. FINDINGS: Stable cardiomediastinal silhouette. Status post coronary bypass graft. Right lung is clear. Minimal left pleural effusion is noted with minimal adjacent subsegmental atelectasis. Bony thorax is unremarkable. IMPRESSION: Minimal left pleural effusion is noted with minimal adjacent subsegmental atelectasis. Electronically Signed   By: Lynwood Landy Raddle M.D.   On: 11/23/2023 11:58   DG Chest 2 View Result Date: 11/20/2023 EXAM: 2 VIEW(S) XRAY OF THE CHEST 11/20/2023 02:05:00 PM COMPARISON: 11/20/2023 CLINICAL HISTORY: Status post cardiac surgery [id number]. Reason for Exam: STATUS POST CARDIAC SURGERY FINDINGS: LUNGS AND PLEURA: Mild streaky atelectasis in left lung base, improved. Small left pleural effusion, unchanged. Trace right pleural effusion. HEART AND MEDIASTINUM: Post-CABG changes noted. BONES AND SOFT TISSUES: Intact sternotomy wires. IMPRESSION: 1. Small left pleural effusion, unchanged. 2. Mild streaky atelectasis in left lung base, improved. 3. Trace right pleural effusion. Electronically signed by: Donnice Mania MD  11/20/2023 05:57 PM EDT RP Workstation: [id number]   DG CHEST PORT 1 VIEW Result Date: 11/20/2023 CLINICAL DATA:  [id number].  Encounter for chest tube removal. EXAM: PORTABLE CHEST 1 VIEW COMPARISON:  Portable chest yesterday at 9:29 a.m. FINDINGS: 5:32 a.m. interval left basilar chest tube removal, with small increased left  pleural effusion. There is no measurable pneumothorax. Increased linear left basilar atelectasis this same no focal consolidation. Intact median sternotomy sutures and stable CABG change. Mild cardiomegaly. No vascular congestion is seen. The mediastinum is stable. No new osseous abnormality. IMPRESSION: 1. Interval left basilar chest tube removal, with small increased left pleural effusion. No measurable pneumothorax. 2. Increased linear left basilar atelectasis. Electronically Signed   By: Francis Quam M.D.   On: 11/20/2023 07:03   DG CHEST PORT 1 VIEW Result Date: 11/19/2023 CLINICAL DATA:  Status post cardiac surgery EXAM: PORTABLE CHEST 1 VIEW COMPARISON:  Chest radiograph dated 11/18/2023 FINDINGS: Lines/tubes: Interval removal of right IJ sheath and mediastinal catheters. Unchanged left basilar pleural catheter. Lungs: Well inflated lungs. No focal consolidation. Pleura: Trace blunting of right costophrenic angle. Trace left apical pneumothorax. Heart/mediastinum: Similar enlarged cardiomediastinal silhouette. Bones: Median sternotomy wires are nondisplaced. IMPRESSION: 1.  Support apparatus as described. 2. Trace left apical pneumothorax with left basilar pleural catheter in place. 3. Trace right pleural effusion. Electronically Signed   By: Limin  Xu M.D.   On: 11/19/2023 10:52   DG Chest Port 1 View Result Date: 11/18/2023 CLINICAL DATA:  Status post cardiac surgery. EXAM: PORTABLE CHEST 1 VIEW COMPARISON:  11/17/2023 FINDINGS: Low lung volumes. The cardio pericardial silhouette is enlarged. Bibasilar atelectasis with probable tiny bilateral effusions. Pulmonary artery catheter is been removed in the interval. Right IJ sheath remains in place with possible kink. Left chest tube again noted without evidence for left-sided pneumothorax. Mediastinal/pericardial drains remain in place. Telemetry leads overlie the chest. IMPRESSION: 1. Low volume film with bibasilar atelectasis and probable tiny bilateral  effusions. 2. Interval removal of pulmonary artery catheter. Right IJ sheath remains in place with possible kink. Electronically Signed   By: Camellia Candle M.D.   On: 11/18/2023 08:38   DG Chest Port 1 View Result Date: 11/17/2023 CLINICAL DATA:  Follow-up recent pneumothorax. EXAM: PORTABLE CHEST 1 VIEW COMPARISON:  11/16/2023 FINDINGS: The technologist mislabeled image with the left indicator placed on the right side of the image. The endotracheal tube has been removed. Stable right jugular Swan-Ganz catheter with its tip in the right main pulmonary artery. Stable mediastinal and left chest tubes and post CABG changes. No pneumothorax. Stable poor inspiration and enlarged cardiac silhouette. Left basilar atelectasis without significant overall change. Left shoulder degenerative changes. IMPRESSION: 1. No pneumothorax. 2. Stable left basilar atelectasis. 3. Stable cardiomegaly. Electronically Signed   By: Elspeth Bathe M.D.   On: 11/17/2023 08:40   DG Chest Port 1 View Result Date: 11/16/2023 CLINICAL DATA:  [id number] Pneumothorax [id number] EXAM: PORTABLE CHEST 1 VIEW COMPARISON:  Chest x-ray 10/09/2023 FINDINGS: Right internal jugular venous approach Swan-Ganz catheter with tip overlying the expected region of the main pulmonary artery. Endotracheal tube with tip at the carina overlying the right mainstem bronchus. Two mediastinal drains noted. Enteric tube with tip and side port overlying the expected region of the gastric lumen. The heart and mediastinal contours are within normal limits. Low lung volumes. Bibasilar atelectasis. No focal consolidation. No pulmonary edema. No pleural effusion. No pneumothorax. No acute osseous abnormality. Interval placement of intact sternotomy wires. IMPRESSION: 1. Endotracheal tube with tip  at the carina overlying the right mainstem bronchus. Recommend retraction by 2 cm. 2. Other lines and tubes as above. 3. No acute cardiopulmonary abnormality. These results will be called to  the ordering clinician or representative by the Radiologist Assistant, and communication documented in the PACS or [employer]. Electronically Signed   By: Morgane  Naveau M.D.   On: 11/16/2023 16:06   ECHO INTRAOPERATIVE TEE Result Date: 11/16/2023  *INTRAOPERATIVE TRANSESOPHAGEAL REPORT *  Patient Name:   DRAYDON CLAIRMONT Date of Exam: 11/16/2023 Medical Rec #:  [id number]      Height:       68.0 in Accession #:    [id number]     Weight:       215.0 lb Date of Birth:  [birth year]      BSA:          2.11 m Patient Age:    67 years       BP:           189/69 mmHg Patient Gender: M              HR:           72 bpm. Exam Location:  Anesthesiology Transesophogeal exam was perform intraoperatively during surgical procedure. Patient was closely monitored under general anesthesia during the entirety of examination. Indications:     CABG Performing Phys: [id number] BAILEY S SU Complications: No known complications during this procedure. POST-OP IMPRESSIONS _ Right Ventricle: mildly reduced function. The cavity was mildly dilated. _ Aorta: The aorta appears unchanged from pre-bypass. _ Left Atrial Appendage: The left atrial appendage appears unchanged from pre-bypass. _ Aortic Valve: The aortic valve appears unchanged from pre-bypass. _ Mitral Valve: There is mild regurgitation. _ Tricuspid Valve: There is moderate regurgitation.Discussed with Dr. Daniel and Kerrin. _ Pulmonic Valve: The pulmonic valve appears unchanged from pre-bypass. PRE-OP FINDINGS  Left Ventricle: The left ventricle has normal systolic function, with an ejection fraction of 55-60%. The cavity size was normal. There is mild concentric left ventricular hypertrophy. Right Ventricle: The right ventricle has normal systolic function. The cavity was normal. There is no increase in right ventricular wall thickness. Left Atrium: Left atrial size was normal in size. No left atrial/left atrial appendage thrombus was detected. Right Atrium: Right atrial size was  normal in size. Interatrial Septum: No atrial level shunt detected by color flow Doppler. There is no evidence of a patent foramen ovale. Pericardium: There is no evidence of pericardial effusion. Mitral Valve: The mitral valve is normal in structure. Mitral valve regurgitation is trivial by color flow Doppler. There is no evidence of mitral valve vegetation. There is No evidence of mitral stenosis. There is mild thickening present. Tricuspid Valve: The tricuspid valve was normal in structure. Tricuspid valve regurgitation is trivial by color flow Doppler. There is no evidence of tricuspid valve vegetation. Aortic Valve: The aortic valve is normal in structure. Aortic valve regurgitation was not visualized by color flow Doppler. There is no stenosis of the aortic valve. There is no evidence of aortic valve vegetation. Pulmonic Valve: The pulmonic valve was normal in structure. Pulmonic valve regurgitation is trivial by color flow Doppler. Aorta: There is evidence of plaque in the descending aorta; Grade II, measuring 2-49mm in size. Shunts: There is no evidence of an atrial septal defect.  Norleen Pope MD Electronically signed by Norleen Pope MD Signature Date/Time: 11/16/2023/4:01:47 PM    Final    ECHOCARDIOGRAM COMPLETE Result Date: 11/15/2023    ECHOCARDIOGRAM REPORT  Patient Name:   RODGERS LIKES Date of Exam: 11/15/2023 Medical Rec #:  [id number]      Height:       68.0 in Accession #:    [id number]     Weight:       215.0 lb Date of Birth:  1955/09/30      BSA:          2.108 m Patient Age:    67 years       BP:           139/84 mmHg Patient Gender: M              HR:           68 bpm. Exam Location:  Inpatient Procedure: 2D Echo, Cardiac Doppler and Color Doppler (Both Spectral and Color            Flow Doppler were utilized during procedure). Indications:    CAD  History:        Patient has no prior history of Echocardiogram examinations.                 CAD; Risk Factors:Hypertension and Former Smoker.   Sonographer:    Juliene Rucks Referring Phys: [id number] BAILEY S SU IMPRESSIONS  1. Left ventricular ejection fraction, by estimation, is 50 to 55%. The left ventricle has low normal function. Left ventricular endocardial border not optimally defined to evaluate regional wall motion. There is mild left ventricular hypertrophy. Left ventricular diastolic parameters were normal.  2. Right ventricular systolic function is normal. The right ventricular size is normal. Tricuspid regurgitation signal is inadequate for assessing PA pressure.  3. The mitral valve is normal in structure. Trivial mitral valve regurgitation. No evidence of mitral stenosis.  4. The aortic valve was not well visualized. Aortic valve regurgitation is not visualized. No aortic stenosis is present. FINDINGS  Left Ventricle: Left ventricular ejection fraction, by estimation, is 50 to 55%. The left ventricle has low normal function. Left ventricular endocardial border not optimally defined to evaluate regional wall motion. The left ventricular internal cavity  size was normal in size. There is mild left ventricular hypertrophy. Left ventricular diastolic parameters were normal. Right Ventricle: The right ventricular size is normal. No increase in right ventricular wall thickness. Right ventricular systolic function is normal. Tricuspid regurgitation signal is inadequate for assessing PA pressure. Left Atrium: Left atrial size was normal in size. Right Atrium: Right atrial size was normal in size. Pericardium: There is no evidence of pericardial effusion. Mitral Valve: The mitral valve is normal in structure. Trivial mitral valve regurgitation. No evidence of mitral valve stenosis. Tricuspid Valve: The tricuspid valve is normal in structure. Tricuspid valve regurgitation is trivial. Aortic Valve: The aortic valve was not well visualized. Aortic valve regurgitation is not visualized. No aortic stenosis is present. Pulmonic Valve: The pulmonic valve was  not well visualized. Pulmonic valve regurgitation is not visualized. Aorta: The aortic root is normal in size and structure. IAS/Shunts: The interatrial septum was not well visualized.  LEFT VENTRICLE PLAX 2D LVIDd:         4.70 cm      Diastology LVIDs:         3.50 cm      LV e' medial:    6.96 cm/s LV PW:         1.10 cm      LV E/e' medial:  11.1 LV IVS:        1.20 cm  LV e' lateral:   10.70 cm/s LVOT diam:     2.20 cm      LV E/e' lateral: 7.2 LV SV:         64 LV SV Index:   30 LVOT Area:     3.80 cm  LV Volumes (MOD) LV vol d, MOD A2C: 145.0 ml LV vol d, MOD A4C: 143.0 ml LV vol s, MOD A2C: 73.5 ml LV vol s, MOD A4C: 70.5 ml LV SV MOD A2C:     71.5 ml LV SV MOD A4C:     143.0 ml LV SV MOD BP:      71.7 ml RIGHT VENTRICLE RV Basal diam:  2.90 cm RV Mid diam:    2.60 cm LEFT ATRIUM             Index        RIGHT ATRIUM           Index LA diam:        3.90 cm 1.85 cm/m   RA Area:     13.20 cm LA Vol (A2C):   47.2 ml 22.40 ml/m  RA Volume:   28.50 ml  13.52 ml/m LA Vol (A4C):   47.2 ml 22.40 ml/m LA Biplane Vol: 48.6 ml 23.06 ml/m  AORTIC VALVE LVOT Vmax:   79.90 cm/s LVOT Vmean:  53.400 cm/s LVOT VTI:    0.168 m  AORTA Ao Root diam: 3.30 cm MITRAL VALVE MV Area (PHT): 3.60 cm    SHUNTS MV Decel Time: 211 msec    Systemic VTI:  0.17 m MV E velocity: 77.40 cm/s  Systemic Diam: 2.20 cm MV A velocity: 92.10 cm/s MV E/A ratio:  0.84 Lonni Nanas MD Electronically signed by Lonni Nanas MD Signature Date/Time: 11/15/2023/6:05:14 PM    Final    VAS US  DOPPLER PRE CABG Result Date: 11/15/2023 PREOPERATIVE VASCULAR EVALUATION Patient Name:  ADAR RASE  Date of Exam:   11/15/2023 Medical Rec #: [id number]       Accession #:    [id number] Date of Birth: 12-Mar-1956       Patient Gender: M Patient Age:   33 years Exam Location:  [facility] Procedure:      VAS US  DOPPLER PRE CABG Referring Phys: BAILEY SU --------------------------------------------------------------------------------   Indications:      Pre-CABG. Risk Factors:     Hypertension, hyperlipidemia. Limitations:      Right TR band Comparison Study: No prior studies. Performing Technologist: Gerome Ny RVT  Examination Guidelines: A complete evaluation includes B-mode imaging, spectral Doppler, color Doppler, and power Doppler as needed of all accessible portions of each vessel. Bilateral testing is considered an integral part of a complete examination. Limited examinations for reoccurring indications may be performed as noted.  Right Carotid Findings: +----------+--------+--------+--------+-----------------------+--------+           PSV cm/sEDV cm/sStenosisDescribe               Comments +----------+--------+--------+--------+-----------------------+--------+ CCA Prox  66      9               smooth and heterogenoustortuous +----------+--------+--------+--------+-----------------------+--------+ CCA Distal60      16              smooth and heterogenous         +----------+--------+--------+--------+-----------------------+--------+ ICA Prox  56      17              smooth and heterogenous         +----------+--------+--------+--------+-----------------------+--------+  ICA Mid   68      31                                     tortuous +----------+--------+--------+--------+-----------------------+--------+ ICA Distal67      31                                     tortuous +----------+--------+--------+--------+-----------------------+--------+ ECA       86      13                                              +----------+--------+--------+--------+-----------------------+--------+ +----------+--------+-------+--------+------------+           PSV cm/sEDV cmsDescribeArm Pressure +----------+--------+-------+--------+------------+ Subclavian142                                 +----------+--------+-------+--------+------------+ +---------+--------+--+--------+--+---------+  VertebralPSV cm/s39EDV cm/s12Antegrade +---------+--------+--+--------+--+---------+ Left Carotid Findings: +----------+--------+--------+--------+-----------------------+--------+           PSV cm/sEDV cm/sStenosisDescribe               Comments +----------+--------+--------+--------+-----------------------+--------+ CCA Prox  120     21              smooth and heterogenous         +----------+--------+--------+--------+-----------------------+--------+ CCA Distal73      25              smooth and heterogenous         +----------+--------+--------+--------+-----------------------+--------+ ICA Prox  90      31              smooth and heterogenous         +----------+--------+--------+--------+-----------------------+--------+ ICA Mid   70      26                                              +----------+--------+--------+--------+-----------------------+--------+ ICA Distal83      37                                     tortuous +----------+--------+--------+--------+-----------------------+--------+ ECA       90      15                                              +----------+--------+--------+--------+-----------------------+--------+ +----------+--------+--------+--------+------------+ SubclavianPSV cm/sEDV cm/sDescribeArm Pressure +----------+--------+--------+--------+------------+           154                     177          +----------+--------+--------+--------+------------+ +---------+--------+--+--------+--+---------+ VertebralPSV cm/s63EDV cm/s20Antegrade +---------+--------+--+--------+--+---------+  ABI Findings: +------------------+-----+-----------+--------+ Rt Pressure (mmHg)IndexWaveform   Comment  +------------------+-----+-----------+--------+  TR band  +------------------+-----+-----------+--------+ 174               0.98 multiphasic          +------------------+-----+-----------+--------+ 162               0.92 multiphasic         +------------------+-----+-----------+--------+ +------------------+-----+-----------+ Lt Pressure (mmHg)IndexWaveform    +------------------+-----+-----------+ 177                    triphasic   +------------------+-----+-----------+ 172               0.97 multiphasic +------------------+-----+-----------+ 151               0.85 multiphasic +------------------+-----+-----------+ +-------+---------------+ ABI/TBIToday's ABI/TBI +-------+---------------+ Right  0.98            +-------+---------------+ Left   0.97            +-------+---------------+  Right Doppler Findings: +--------+--------+ Site    Comments +--------+--------+ BrachialTR band  +--------+--------+ Radial  TR band  +--------+--------+ Ulnar   TR band  +--------+--------+  Left Doppler Findings: +--------+--------+---------+ Site    PressureDoppler   +--------+--------+---------+ [id number]     triphasic +--------+--------+---------+ Radial          triphasic +--------+--------+---------+ Ulnar           triphasic +--------+--------+---------+   Summary: Right Carotid: Velocities in the right ICA are consistent with a 1-39% stenosis. Left Carotid: Velocities in the left ICA are consistent with a 1-39% stenosis. Vertebrals: Bilateral vertebral arteries demonstrate antegrade flow. Right ABI: Resting right ankle-brachial index is within normal range. Left ABI: Resting left ankle-brachial index is within normal range. Left Upper Extremity: Doppler waveforms remain within normal limits with left radial compression. Doppler waveforms remain within normal limits with left ulnar compression.  Electronically signed by Gaile New MD on 11/15/2023 at 4:56:37 PM.    Final    CARDIAC CATHETERIZATION Result Date: 11/15/2023 Images from the original result were not included. Cardiac Catheterization 11/15/23:  Hemodynamic data: LV: 161/3, EDP 23 mmHg.  Ao 156/90, mean 121 mmHg.  No pressure gradient across the aortic valve. Angiographic data: LM: Distal left main has a calcific and complex irregular 80% stenosis. LAD: LAD is calcified in the proximal segment.  There is a eccentric 60% stenosis in the proximal LAD.  Large D2.  Mild disease in the mid and distal LAD. LCx: Very large caliber vessel giving origin to a large OM1.  Proximal Cx has a 90% calcific stenosis, large OM1 has a proximal 90% stenosis followed by distal 60% stenosis. RI: Moderate caliber vessel, supplies large area.  Ostium has ectasia. RCA: Has anterior origin.  There is tandem 40% proximal and a mid 60% stenosis.  PDA PL branches are very small and diffusely diseased and PL has a focal ostial 80% stenosis. 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  Hence discharge made to admit the patient for inpatient artery bypass grafting.  Patient will be started on IV heparin  as well.   LONG TERM MONITOR (3-14 DAYS) Result Date: 11/04/2023 Patch Wear Time:  13 days and 15 hours (2025-08-01T19:09:38-0400 to 2025-08-15T10:22:16-0400) HR 35 - 132, average 71 bpm. 1 nonsustained SVT (longest 8 beats). No atrial fibrillation detected. Rare supraventricular ectopy. Rare ventricular ectopy. No sustained arrhythmias. Symptom trigger episodes correspond to sinus rhythm. Fonda Kitty Cardiac Electrophysiology  CT CORONARY [facility]  W/CTA COR W/SCORE W/CA W/CM &/OR WO/CM Addendum Date: 11/03/2023 ADDENDUM REPORT: 11/03/2023 21:11 EXAM: OVER-READ INTERPRETATION  CT CHEST The following report is an over-read performed by radiologist Dr. Suzen Dials of [facility] on 11/03/2023. This over-read does not include interpretation of cardiac or coronary anatomy or pathology. The coronary calcium  score/coronary CTA  interpretation by the cardiologist is attached. COMPARISON:  None. FINDINGS: Cardiovascular: There are no significant extracardiac vascular findings. Mediastinum/Nodes: There are no enlarged lymph nodes within the visualized mediastinum. Lungs/Pleura: There is no pleural effusion. Mild right middle lobe linear scarring and/or atelectasis is seen. Mild atelectatic changes are also noted within the posterior aspect of the left lung base. Upper abdomen: No significant findings in the visualized upper abdomen. Musculoskeletal/Chest wall: No chest wall mass or suspicious osseous findings within the visualized chest. IMPRESSION: No significant extracardiac findings within the visualized chest. Electronically Signed   By: Suzen Dials M.D.   On: 11/03/2023 21:11   Result Date: 11/03/2023 CLINICAL DATA:  CP EXAM: Cardiac/Coronary  CTA TECHNIQUE: The patient was scanned on a GE Apex scanner. FINDINGS: A 120 kV prospective scan was triggered in the descending thoracic aorta at 111 HU's. Axial non-contrast 3 mm slices were carried out through the heart. The data set was analyzed on a dedicated work station and scored using the Agatson method. Gantry rotation speed was 250 msecs and collimation was .6 mm. No beta blockade and 0.8 mg of sl NTG was given. The 3D data set was reconstructed at 75% of the R-R cycle. Diastolic phases were analyzed on a dedicated work station using MPR, MIP and VRT modes. The patient received 80 cc of contrast. Aorta: Normal size.  No calcifications.  No dissection. Aortic Valve:  Trileaflet.  No calcifications. Coronary Arteries:  Normal coronary origin.  Right dominance. RCA is a large dominant artery that gives rise to PDA and PLA. This artery is diffusely diseased in its proximal and mid portion with numerous mixed plaques and moderate stenosis of 50-69%. Left main is a large artery that gives rise to LAD and LCX arteries as well as moderate size Intermediate Branch. In the distal portion  of LM there is mixed plaque with mild stenosis of 25-49%. LAD is a large vessel that diffusely diseased in its proximal and mid portion with numerous mostly calcified plaques with moderate stenosis of 50-69%. This artery gives rise to small D1 and large D2. D2 has mixed plaque in its proximal portion with moderate stenosis of 50-69%. Intermediate Branch has mixed plaques in its proximal portion with mild stenosis of 25-49%. LCX is a non-dominant artery that gives rise to one large OM1 branch. There is mixed plaque in the proximal portion of this artery with severe stenosis of 70-99%. Large OM1 has mixed plaque in its proximal with moderate stenosis of 50-69%. Other findings: Normal pulmonary vein drainage into the left atrium. Rt middle pulmonary vein noted - normal variant. Normal left atrial appendage without a thrombus. Normal size of the pulmonary artery. IMPRESSION: 1. Coronary calcium  score of 1393. This was 38 percentile for age and sex matched control. 2. Normal coronary origin with right dominance. 3. CAD-RADS 4 Severe stenosis. (70-99%) prox CX. Cardiac catheterization or CT FFR is recommended. Consider symptom-guided anti-ischemic pharmacotherapy as well as risk factor modification per guideline directed care. 4. Plaque analysis: TPV 1478 mm3, 91 st percentile, calcified plaque 407 mm3, non calcified plaque 1071 mm3, Extensive TPV. Electronically Signed: By: Lamar Fitch M.D. On: 11/03/2023 18:20  CT CORONARY FRACTIONAL FLOW RESERVE FLUID ANALYSIS Result Date: 11/03/2023 EXAM: FFRCT ANALYSIS FINDINGS: FFRct analysis was performed on the original cardiac CT angiogram dataset. Diagrammatic representation of the FFRct analysis is provided in a separate PDF document in PACS. This dictation was created using the PDF document and an interactive 3D model of the results. 3D model is not available in the EMR/PACS. Normal FFR range is >0.80. 1. LM FFR: Prox 0.99, distal FFR: 0.88 2. LAD FFR: Prox 0.85, mid  0.67, distal 0.6 3. CX FFR: Prox 0.86, mid 0.51, distal 0.51 4. RCA FFR: Prox 0.98, mid 0.72, distal 0.63 5. IMPRESSION: This study demonstrates HIGH likelihood of hemodynamically significant stenosis of prox CX, mid LAD, mid RCA. TVD, borderline distal LM. Electronically Signed   By: Lamar Fitch M.D.   On: 11/03/2023 18:24    Microbiology: Results for orders placed or performed during the hospital encounter of 11/15/23  Surgical PCR screen     Status: None   Collection Time: 11/15/23  4:16 PM   Specimen: Nasal Mucosa; Nasal Swab  Result Value Ref Range Status   MRSA, PCR NEGATIVE NEGATIVE Final   Staphylococcus aureus NEGATIVE NEGATIVE Final    Comment: (NOTE) The Xpert SA Assay (FDA approved for NASAL specimens in patients 59 years of age and older), is one component of a comprehensive surveillance program. It is not intended to diagnose infection nor to guide or monitor treatment. Performed at [facility], [address] [place], [place] [address]     Labs: CBC: Recent Labs  Lab 11/18/23 0400 11/19/23 0742 11/20/23 0338 11/23/23 1141 11/23/23 1146 11/24/23 0559  WBC 8.7 6.1 5.2 7.5  --  6.2  NEUTROABS  --   --   --  5.6  --   --   HGB 9.5* 9.1* 8.9* 11.2* 10.5* 10.6*  HCT 28.7* 26.6* 26.5* 33.7* 31.0* 31.2*  MCV 90.5 88.7 89.5 91.6  --  88.9  PLT 107* 101* 115* 225  --  200   Basic Metabolic Panel: Recent Labs  Lab 11/17/23 1617 11/18/23 0400 11/19/23 0742 11/20/23 0338 11/23/23 1141 11/23/23 1146 11/23/23 1322 11/24/23 0559  NA 136 138 135 136 137 135  --  137  K 4.4 4.7 4.3 4.3 4.9 4.8  --  4.6  CL 101 102 101 102 101 101  --  101  CO2 25 25 25 25 23   --   --  22  GLUCOSE 121* 120* 149* 106* 167* 169*  --  106*  BUN 21 26* 27* 21 22 24*  --  22  CREATININE 1.51* 1.49* 1.08 0.96 1.33* 1.40*  --  1.17  CALCIUM  7.7* 7.6* 7.8* 7.9* 8.8*  --   --  8.7*  MG 3.0*  --   --   --   --   --  2.0  --    Liver Function Tests: Recent Labs  Lab  11/23/23 1141 11/24/23 0559  AST 67* 38  ALT 83* 66*  ALKPHOS 61 55  BILITOT 0.9 1.1  PROT 6.9 6.1*  ALBUMIN  3.7 3.0*   CBG: Recent Labs  Lab 11/18/23 1210 11/18/23 1628 11/18/23 2113 11/19/23 0634 11/24/23 0834  GLUCAP 112* 113* 102* 114* 90    Discharge time spent: {LESS THAN/GREATER THAN:26388} 30 minutes.  Signed: Elgin Lam, MD [facility] 11/24/2023

## 2023-11-24 NOTE — Telephone Encounter (Signed)
 Faxed outside referral for Phase II Cardiac Rehab to [facility].

## 2023-11-24 NOTE — Discharge Instructions (Signed)
 Anthony Boone,  You were in the hospital after a passing out episode. There was a concern that this could be related to your heart, but thankfully this does not appear to be the case. It seems most likely that you were on the more dehydrated side. You received fluids. Your heart surgeon came to see you and has no concerns at this time relating to your heart/surgery. Please follow-up with your PCP and keep hydrated.

## 2023-11-26 NOTE — Hospital Course (Signed)
 Anthony Boone is a 68 y.o. male with a history of hypertension, hyperlipidemia, CAD s/p CABG, paroxysmal atrial fibrillation, obesity.  Patient presented secondary to a syncopal episode, complicated by recent CABG history. Patient noted to have positive orthostatic vitals on admission. Patient managed with IV fluids and monitored overnight on telemetry.

## 2023-11-29 ENCOUNTER — Other Ambulatory Visit: Payer: Self-pay

## 2023-11-29 DIAGNOSIS — Z951 Presence of aortocoronary bypass graft: Secondary | ICD-10-CM

## 2023-11-30 ENCOUNTER — Ambulatory Visit: Payer: Self-pay

## 2023-11-30 ENCOUNTER — Ambulatory Visit (HOSPITAL_COMMUNITY)
Admission: RE | Admit: 2023-11-30 | Discharge: 2023-11-30 | Disposition: A | Discharge status: Home / Self Care | Source: Ambulatory Visit | Attending: Internal Medicine | Admitting: Internal Medicine

## 2023-11-30 VITALS — BP 126/76 | HR 66 | Resp 20 | Ht 68.0 in | Wt 213.0 lb

## 2023-11-30 DIAGNOSIS — Z951 Presence of aortocoronary bypass graft: Secondary | ICD-10-CM | POA: Diagnosis not present

## 2023-11-30 DIAGNOSIS — J9811 Atelectasis: Secondary | ICD-10-CM | POA: Diagnosis not present

## 2023-11-30 DIAGNOSIS — J90 Pleural effusion, not elsewhere classified: Secondary | ICD-10-CM | POA: Diagnosis not present

## 2023-11-30 NOTE — Progress Notes (Signed)
      [address] [place] [address]             [phone]    HPI:  Patient returns for routine postoperative follow-up having undergone CABG x 3 on 11/16/23. The patient's early postoperative recovery while in the hospital was notable for brief run of Afib.  He was also readmitted on 9/11 after having a vasovagal episode at home Since hospital discharge the patient reports doing well.  He still gets a little disoriented when standing up, but it resolves within 10 seconds. He has not been checking blood pressure at home.  He also reports some popping in his chest when he lays on his side but denies any pain.  Appetite is slowly improving and he is walking frequently. Also pulling 2L on incentive spirometer.  Pain medication: None  Bowel function: Normal   Allergies as of 11/30/2023   No Known Allergies      Medication List        Accurate as of November 30, 2023 12:40 PM. If you have any questions, ask your nurse or doctor.          amiodarone  200 MG tablet Commonly known as: PACERONE  Take 2 tablets (400mg  total) twice daily for 2 days; then take 1 tablet (200mg  total) twice daily for 7 days; then take 1 tablet (200mg  toal) once daily thereafter.   ascorbic acid 500 MG tablet Commonly known as: VITAMIN C Take 1,000 mg by mouth daily.   aspirin  EC 81 MG tablet Take 81 mg by mouth daily. Swallow whole.   lisinopril  20 MG tablet Commonly known as: ZESTRIL  Take 1 tablet (20 mg total) by mouth daily for blood pressure   metoprolol  tartrate 25 MG tablet Commonly known as: LOPRESSOR  Take 1 tablet (25 mg total) by mouth 2 (two) times daily.   rosuvastatin  20 MG tablet Commonly known as: CRESTOR  Take 1 tablet (20 mg total) by mouth daily.        BP 126/76   Pulse 66   Resp 20   Ht 5' 8 (1.727 m)   Wt 213 lb (96.6 kg)   SpO2 97% Comment: RA  BMI 32.39 kg/m   Physical Exam: General - Well-appearing, no distress CV - RRR, normal S1S2,  incision CDI. Sternum feels stable. Resp - clear to auscultation bilaterally; chest tube stitches removed Abd - Soft, ND/NT Ext - Very trace lower extremity edema  Imaging: CXR - tiny left pleural effusion  Assessment/Plan: Anthony Boone is a 68 year old man s/p CABG x 3 on 9/4 who is progressing well.  Still with a little disorientation when doing sit to stand, but hemodynamics are normal.  Advised him to check blood pressure daily and take it slow when changing positions.  He will follow-up with cardiology next week and come back to see me in 4 weeks, sooner if there are any issues.  Will refer to cardiac rehab in 2 weeks (Oct 2).  Con GORMAN [name], MD 12:40 PM 11/30/23

## 2023-11-30 NOTE — Telephone Encounter (Signed)
 Anthony Boone

## 2023-12-01 NOTE — Progress Notes (Signed)
 " Cardiology Office Note   Date:  12/07/2023  ID:  Anthony, Boone Jul 01, 1955, MRN [id number] PCP: Anthony Boone NOVAK, NP  [facility] Providers Cardiologist:  Anthony Leiter, MD Cardiology APP:  Anthony Delon BROCKS, NP     History of Present Illness Anthony Boone is a 68 y.o. male with a past medical history of CAD s/p CABG, mild bilateral carotid artery stenosis, PAF, prior tobacco abuse, erectile dysfunction, dyslipidemia, hypertension  11/16/2023  CABG x 3 SVG to OM, SVG to ramus, LIMA to LAD 11/15/2023 echo EF 50 to 55%, mild LVH, trivial MR 11/15/2023 carotid duplex mild bilateral carotid artery stenosis 11/15/2023 left heart cath critical left main disease >> admitted for CABG 11/03/2023 coronary CT calcium  score 1393, 93rd percentile, extensive TPV, FFR demonstrating high likelihood of hemodynamic significance in multiple vessels 10/09/2023 monitor predominantly sinus rhythm, average heart rate 71 bpm, 1 run of SVT  He established with [facility] earlier this year after a recent traumatic event where he had a CT scan revealing coronary artery calcifications.  He also had symptoms of exertional dyspnea and chest pain, also possibly a syncopal event as well.  He underwent a coronary CTA revealing a calcium  score 1393, extensive T PV and FFR demonstrating high likelihood of hemodynamic significant stenosis in multiple vessels.  He underwent a left heart cath on 11/15/2023 revealing critical left main disease >>  underwent three-vessel CABG on 11/16/2023 developing short runs of atrial fibrillation postoperatively and he was started on IV amiodarone  >> converting to sinus rhythm and transition to p.o. amiodarone .  He developed thrombocytopenia and was transfused with 1 unit of FFP.  Remainder of his hospitalization was overall uncomplicated and he was discharged on postop day 5.  On 11/23/2023 he was apparently trying to get up off of his couch, became diaphoretic, near syncopal event and was brought to  the hospital where he was found to have positive orthostatic vital signs, Lasix  was discontinued and he was advised to increase his oral hydration.  Most recently he was evaluated by [facility], Dr. Daniel on 12/01/2023, felt to be progressing well, he was advised to check his blood pressure and change positions slowly, advised he can start cardiac rehab around October 2.  [name] presents today for follow-up after his bypass surgery as outlined above.  He is steadily improving, he is eager to return to his prior activities.  He had plans to start cardiac rehab around October 2 however his mother's suffered a fall, and he is very involved in her care so he is post cardiac rehab back approximately a month.  He is trying to increase his activity around his home.  He is adhering to sternal precautions, using his incentive spirometer, no further episodes of syncope or near syncope.  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  He denies chest pain, palpitations, dyspnea, pnd, orthopnea, n, v, dizziness, syncope, weight gain, or early satiety.   ROS: Review of Systems  Constitutional:  Positive for malaise/fatigue (Slowly improving).  Cardiovascular:  Positive for chest pain (Chest soreness following sternotomy) and leg swelling (Right leg harvest site).  All other systems reviewed and are negative.    Studies Reviewed EKG Interpretation Date/Time:  Wednesday December 06 2023 08:24:46 EDT Ventricular Rate:  66 PR Interval:  204 QRS Duration:  80  QT Interval:  406 QTC Calculation: 425 R Axis:   -14  Text Interpretation: Normal sinus rhythm Normal ECG When compared with ECG of 23-Nov-2023 11:08, Questionable change in QRS axis Nonspecific T wave abnormality no longer evident in Inferior leads Nonspecific T wave abnormality, worse in Lateral leads  Confirmed by Anthony Nest [phone]) on 12/06/2023 8:35:48 AM    Cardiac Studies & Procedures   ______________________________________________________________________________________________ CARDIAC CATHETERIZATION  CARDIAC CATHETERIZATION 11/15/2023  Conclusion Images from the original result were not included. Cardiac Catheterization 11/15/23: Hemodynamic data: LV: 161/3, EDP 23 mmHg.  Ao 156/90, mean 121 mmHg.  No pressure gradient across the aortic valve.  Angiographic data: LM: Distal left main has a calcific and complex irregular 80% stenosis. LAD: LAD is calcified in the proximal segment.  There is a eccentric 60% stenosis in the proximal LAD.  Large D2.  Mild disease in the mid and distal LAD. LCx: Very large caliber vessel giving origin to a large OM1.  Proximal Cx has a 90% calcific stenosis, large OM1 has a proximal 90% stenosis followed by distal 60% stenosis. RI: Moderate caliber vessel, supplies large area.  Ostium has ectasia. RCA: Has anterior origin.  There is tandem 40% proximal and a mid 60% stenosis.  PDA PL branches are very small and diffusely diseased and PL has a focal ostial 80% stenosis.   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  Hence discharge made to admit the patient for inpatient artery bypass grafting.  Patient will be started on IV heparin  as well.  Findings Coronary Findings Diagnostic  Dominance: Right  Left Main Mid LM lesion is 80% stenosed.  Left Anterior Descending Prox LAD lesion is 60% stenosed.  Ramus Intermedius Non-stenotic Ramus lesion.  Left Circumflex Prox Cx lesion is 80% stenosed.  First Obtuse Marginal Branch 1st Mrg-1 lesion is 90% stenosed. 1st Mrg-2 lesion is 60% stenosed.  Right Coronary Artery Prox RCA lesion is 40% stenosed. Mid RCA lesion is 60% stenosed.  Right Posterior  Descending Artery There is moderate disease in the vessel.  Right Posterior Atrioventricular Artery There is moderate disease in the vessel. RPAV lesion is 80% stenosed.  Intervention  No interventions have been documented.     ECHOCARDIOGRAM  ECHOCARDIOGRAM COMPLETE 11/15/2023  Narrative ECHOCARDIOGRAM REPORT    Patient Name:   Anthony Boone Date of Exam: 11/15/2023 Medical Rec #:  [id number]      Height:       68.0 in Accession #:    [id number]     Weight:       215.0 lb Date of Birth:  1955/08/10      BSA:          2.108 m Patient Age:    67 years       BP:           139/84 mmHg Patient Gender: M              HR:           68 bpm. Exam Location:  Inpatient  Procedure: 2D Echo, Cardiac Doppler and Color Doppler (Both Spectral and Color Flow Doppler were utilized during procedure).  Indications:    CAD  History:        Patient has no prior history of Echocardiogram examinations. CAD; Risk Factors:Hypertension and Former Smoker.  Sonographer:    Juliene Rucks Referring Phys: [id number] CON RAMAN  SU  IMPRESSIONS   1. Left ventricular ejection fraction, by estimation, is 50 to 55%. The left ventricle has low normal function. Left ventricular endocardial border not optimally defined to evaluate regional wall motion. There is mild left ventricular hypertrophy. Left ventricular diastolic parameters were normal. 2. Right ventricular systolic function is normal. The right ventricular size is normal. Tricuspid regurgitation signal is inadequate for assessing PA pressure. 3. The mitral valve is normal in structure. Trivial mitral valve regurgitation. No evidence of mitral stenosis. 4. The aortic valve was not well visualized. Aortic valve regurgitation is not visualized. No aortic stenosis is present.  FINDINGS Left Ventricle: Left ventricular ejection fraction, by estimation, is 50 to 55%. The left ventricle has low normal function. Left ventricular endocardial border not optimally  defined to evaluate regional wall motion. The left ventricular internal cavity size was normal in size. There is mild left ventricular hypertrophy. Left ventricular diastolic parameters were normal.  Right Ventricle: The right ventricular size is normal. No increase in right ventricular wall thickness. Right ventricular systolic function is normal. Tricuspid regurgitation signal is inadequate for assessing PA pressure.  Left Atrium: Left atrial size was normal in size.  Right Atrium: Right atrial size was normal in size.  Pericardium: There is no evidence of pericardial effusion.  Mitral Valve: The mitral valve is normal in structure. Trivial mitral valve regurgitation. No evidence of mitral valve stenosis.  Tricuspid Valve: The tricuspid valve is normal in structure. Tricuspid valve regurgitation is trivial.  Aortic Valve: The aortic valve was not well visualized. Aortic valve regurgitation is not visualized. No aortic stenosis is present.  Pulmonic Valve: The pulmonic valve was not well visualized. Pulmonic valve regurgitation is not visualized.  Aorta: The aortic root is normal in size and structure.  IAS/Shunts: The interatrial septum was not well visualized.   LEFT VENTRICLE PLAX 2D LVIDd:         4.70 cm      Diastology LVIDs:         3.50 cm      LV e' medial:    6.96 cm/s LV PW:         1.10 cm      LV E/e' medial:  11.1 LV IVS:        1.20 cm      LV e' lateral:   10.70 cm/s LVOT diam:     2.20 cm      LV E/e' lateral: 7.2 LV SV:         64 LV SV Index:   30 LVOT Area:     3.80 cm  LV Volumes (MOD) LV vol d, MOD A2C: 145.0 ml LV vol d, MOD A4C: 143.0 ml LV vol s, MOD A2C: 73.5 ml LV vol s, MOD A4C: 70.5 ml LV SV MOD A2C:     71.5 ml LV SV MOD A4C:     143.0 ml LV SV MOD BP:      71.7 ml  RIGHT VENTRICLE RV Basal diam:  2.90 cm RV Mid diam:    2.60 cm  LEFT ATRIUM             Index        RIGHT ATRIUM           Index LA diam:        3.90 cm 1.85 cm/m   RA  Area:     13.20 cm LA Vol (A2C):   47.2 ml 22.40 ml/m  RA Volume:  28.50 ml  13.52 ml/m LA Vol (A4C):   47.2 ml 22.40 ml/m LA Biplane Vol: 48.6 ml 23.06 ml/m AORTIC VALVE LVOT Vmax:   79.90 cm/s LVOT Vmean:  53.400 cm/s LVOT VTI:    0.168 m  AORTA Ao Root diam: 3.30 cm  MITRAL VALVE MV Area (PHT): 3.60 cm    SHUNTS MV Decel Time: 211 msec    Systemic VTI:  0.17 m MV E velocity: 77.40 cm/s  Systemic Diam: 2.20 cm MV A velocity: 92.10 cm/s MV E/A ratio:  0.84  Lonni Nanas MD Electronically signed by Lonni Nanas MD Signature Date/Time: 11/15/2023/6:05:14 PM    Final   TEE  ECHO INTRAOPERATIVE TEE 11/16/2023  Narrative *INTRAOPERATIVE TRANSESOPHAGEAL REPORT *    Patient Name:   Anthony Boone Date of Exam: 11/16/2023 Medical Rec #:  [id number]      Height:       68.0 in Accession #:    [id number]     Weight:       215.0 lb Date of Birth:  [birth year]      BSA:          2.11 m Patient Age:    67 years       BP:           189/69 mmHg Patient Gender: M              HR:           72 bpm. Exam Location:  Anesthesiology  Transesophogeal exam was perform intraoperatively during surgical procedure. Patient was closely monitored under general anesthesia during the entirety of examination.  Indications:     CABG Performing Phys: [id number] BAILEY S SU  Complications: No known complications during this procedure. POST-OP IMPRESSIONS _ Right Ventricle: mildly reduced function. The cavity was mildly dilated. _ Aorta: The aorta appears unchanged from pre-bypass. _ Left Atrial Appendage: The left atrial appendage appears unchanged from pre-bypass. _ Aortic Valve: The aortic valve appears unchanged from pre-bypass. _ Mitral Valve: There is mild regurgitation. _ Tricuspid Valve: There is moderate regurgitation.Discussed with Dr. Daniel and Kerrin. _ Pulmonic Valve: The pulmonic valve appears unchanged from pre-bypass.  PRE-OP FINDINGS Left Ventricle: The left  ventricle has normal systolic function, with an ejection fraction of 55-60%. The cavity size was normal. There is mild concentric left ventricular hypertrophy.   Right Ventricle: The right ventricle has normal systolic function. The cavity was normal. There is no increase in right ventricular wall thickness.  Left Atrium: Left atrial size was normal in size. No left atrial/left atrial appendage thrombus was detected.  Right Atrium: Right atrial size was normal in size.  Interatrial Septum: No atrial level shunt detected by color flow Doppler. There is no evidence of a patent foramen ovale.  Pericardium: There is no evidence of pericardial effusion.  Mitral Valve: The mitral valve is normal in structure. Mitral valve regurgitation is trivial by color flow Doppler. There is no evidence of mitral valve vegetation. There is No evidence of mitral stenosis. There is mild thickening present.  Tricuspid Valve: The tricuspid valve was normal in structure. Tricuspid valve regurgitation is trivial by color flow Doppler. There is no evidence of tricuspid valve vegetation.  Aortic Valve: The aortic valve is normal in structure. Aortic valve regurgitation was not visualized by color flow Doppler. There is no stenosis of the aortic valve. There is no evidence of aortic valve vegetation.   Pulmonic Valve: The pulmonic valve was normal in structure. Pulmonic valve  regurgitation is trivial by color flow Doppler.   Aorta: There is evidence of plaque in the descending aorta; Grade II, measuring 2-80mm in size.  Shunts: There is no evidence of an atrial septal defect.   Norleen Pope MD Electronically signed by Norleen Pope MD Signature Date/Time: 11/16/2023/4:01:47 PM    Final  MONITORS  LONG TERM MONITOR (3-14 DAYS) 11/02/2023  Narrative Patch Wear Time:  13 days and 15 hours (2025-08-01T19:09:38-0400 to 2025-08-15T10:22:16-0400)  HR 35 - 132, average 71 bpm. 1 nonsustained SVT (longest 8  beats). No atrial fibrillation detected. Rare supraventricular ectopy. Rare ventricular ectopy. No sustained arrhythmias. Symptom trigger episodes correspond to sinus rhythm.   Fonda Kitty Cardiac Electrophysiology   CT SCANS  CT CORONARY MORPH W/CTA COR W/SCORE 10/31/2023  Addendum 11/03/2023  9:13 PM ADDENDUM REPORT: 11/03/2023 21:11  EXAM: OVER-READ INTERPRETATION  CT CHEST  The following report is an over-read performed by radiologist Dr. Suzen Dials of [facility] on 11/03/2023. This over-read does not include interpretation of cardiac or coronary anatomy or pathology. The coronary calcium  score/coronary CTA interpretation by the cardiologist is attached.  COMPARISON:  None.  FINDINGS: Cardiovascular: There are no significant extracardiac vascular findings.  Mediastinum/Nodes: There are no enlarged lymph nodes within the visualized mediastinum.  Lungs/Pleura: There is no pleural effusion. Mild right middle lobe linear scarring and/or atelectasis is seen. Mild atelectatic changes are also noted within the posterior aspect of the left lung base.  Upper abdomen: No significant findings in the visualized upper abdomen.  Musculoskeletal/Chest wall: No chest wall mass or suspicious osseous findings within the visualized chest.  IMPRESSION: No significant extracardiac findings within the visualized chest.   Electronically Signed By: Suzen Dials M.D. On: 11/03/2023 21:11  Narrative CLINICAL DATA:  CP  EXAM: Cardiac/Coronary  CTA  TECHNIQUE: The patient was scanned on a GE Apex scanner.  FINDINGS: A 120 kV prospective scan was triggered in the descending thoracic aorta at 111 HU's. Axial non-contrast 3 mm slices were carried out through the heart. The data set was analyzed on a dedicated work station and scored using the Agatson method. Gantry rotation speed was 250 msecs and collimation was .6 mm. No beta blockade and 0.8 mg of  sl NTG was given. The 3D data set was reconstructed at 75% of the R-R cycle. Diastolic phases were analyzed on a dedicated work station using MPR, MIP and VRT modes. The patient received 80 cc of contrast.  Aorta: Normal size.  No calcifications.  No dissection.  Aortic Valve:  Trileaflet.  No calcifications.  Coronary Arteries:  Normal coronary origin.  Right dominance.  RCA is a large dominant artery that gives rise to PDA and PLA. This artery is diffusely diseased in its proximal and mid portion with numerous mixed plaques and moderate stenosis of 50-69%.  Left main is a large artery that gives rise to LAD and LCX arteries as well as moderate size Intermediate Branch. In the distal portion of LM there is mixed plaque with mild stenosis of 25-49%.  LAD is a large vessel that diffusely diseased in its proximal and mid portion with numerous mostly calcified plaques with moderate stenosis of 50-69%. This artery gives rise to small D1 and large D2. D2 has mixed plaque in its proximal portion with moderate stenosis of 50-69%.  Intermediate Branch has mixed plaques in its proximal portion with mild stenosis of 25-49%.  LCX is a non-dominant artery that gives rise to one large OM1 branch. There is mixed plaque  in the proximal portion of this artery with severe stenosis of 70-99%. Large OM1 has mixed plaque in its proximal with moderate stenosis of 50-69%.  Other findings:  Normal pulmonary vein drainage into the left atrium. Rt middle pulmonary vein noted - normal variant.  Normal left atrial appendage without a thrombus.  Normal size of the pulmonary artery.  IMPRESSION: 1. Coronary calcium  score of 1393. This was 22 percentile for age and sex matched control.  2. Normal coronary origin with right dominance.  3. CAD-RADS 4 Severe stenosis. (70-99%) prox CX. Cardiac catheterization or CT FFR is recommended. Consider symptom-guided anti-ischemic pharmacotherapy as well as  risk factor modification per guideline directed care.  4. Plaque analysis: TPV 1478 mm3, 91 st percentile, calcified plaque 407 mm3, non calcified plaque 1071 mm3, Extensive TPV.  Electronically Signed: By: Lamar Fitch M.D. On: 11/03/2023 18:20     ______________________________________________________________________________________________      Risk Assessment/Calculations  CHA2DS2-VASc Score = 3   This indicates a 3.2% annual risk of stroke. The patient's score is based upon: CHF History: 0 HTN History: 1 Diabetes History: 0 Stroke History: 0 Vascular Disease History: 1 Age Score: 1 Gender Score: 0            Physical Exam VS:  BP 128/70   Pulse 66   Ht 5' 8 (1.727 m)   Wt 214 lb (97.1 kg)   SpO2 97%   BMI 32.54 kg/m        Wt Readings from Last 3 Encounters:  12/06/23 214 lb (97.1 kg)  11/30/23 213 lb (96.6 kg)  11/24/23 206 lb 14.4 oz (93.8 kg)    GEN: Well nourished, well developed in no acute distress NECK: No JVD; No carotid bruits CARDIAC: RRR, no murmurs, rubs, gallops RESPIRATORY:  Clear to auscultation without rales, wheezing or rhonchi  ABDOMEN: Soft, non-tender, non-distended EXTREMITIES: Trace edema right leg; No deformity  SKIN: Sternotomy site healing well, no residual sutures, right leg SVG site healing well, some trace edema  ASSESSMENT AND PLAN CAD -s/p CABG x 3 on 11/16/2023 >> already seen [facility]CTS cleared for cardiac rehab, he will delay this a little bit to help take care of his mother who is her covering from a hip fracture. Stable with no anginal symptoms. No indication for ischemic evaluation.  Continue aspirin  81 mg daily, metoprolol  25 mg twice daily, Crestor  20 mg daily.  PAF-this was in the postoperative setting, he converted with IV amiodarone  has been evaluated by Dr. Daniel, no recurrent episodes of atrial fibrillation and his amiodarone  is since been discontinued.  His CHA2DS2-VASc score is 3, if he would have further episodes of  atrial fibrillation in the future we would need to consider DOAC at that time.  Continue metoprolol  25 mg twice daily.  Hypertension-continue lisinopril  20 mg daily, blood pressure controlled 128/70.  Dyslipidemia with elevated LP(a)-LDL controlled at 55, triglycerides 326, LP(a) 109.9--continue Crestor  20 mg daily, repeat c-Met, FLP in 6 weeks.  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 as needed for pedal edema.  Will repeat c-Met today.  Postoperative anemia-will repeat CBC.       Dispo: CMET, CBC today, fasting lipid panel and c-Met in 6 weeks.  Follow-up with Dr. Monetta in 8 weeks.  Signed, Delon JAYSON Hoover, NP  "

## 2023-12-05 ENCOUNTER — Other Ambulatory Visit: Payer: Self-pay

## 2023-12-05 NOTE — Progress Notes (Signed)
 ok

## 2023-12-06 ENCOUNTER — Encounter: Payer: Self-pay | Admitting: Cardiology

## 2023-12-06 ENCOUNTER — Ambulatory Visit: Attending: Cardiology | Admitting: Cardiology

## 2023-12-06 VITALS — BP 128/70 | HR 66 | Ht 68.0 in | Wt 214.0 lb

## 2023-12-06 DIAGNOSIS — I10 Essential (primary) hypertension: Secondary | ICD-10-CM

## 2023-12-06 DIAGNOSIS — R609 Edema, unspecified: Secondary | ICD-10-CM | POA: Diagnosis present

## 2023-12-06 DIAGNOSIS — I25118 Atherosclerotic heart disease of native coronary artery with other forms of angina pectoris: Secondary | ICD-10-CM | POA: Diagnosis present

## 2023-12-06 DIAGNOSIS — E782 Mixed hyperlipidemia: Secondary | ICD-10-CM

## 2023-12-06 DIAGNOSIS — Z79899 Other long term (current) drug therapy: Secondary | ICD-10-CM

## 2023-12-06 DIAGNOSIS — D649 Anemia, unspecified: Secondary | ICD-10-CM | POA: Diagnosis present

## 2023-12-06 DIAGNOSIS — E7841 Elevated Lipoprotein(a): Secondary | ICD-10-CM | POA: Diagnosis present

## 2023-12-06 MED ORDER — FUROSEMIDE 20 MG PO TABS: 20.0000 mg | ORAL_TABLET | Freq: Every day | ORAL | 5 refills | Status: DC | PRN

## 2023-12-06 NOTE — Patient Instructions (Signed)
 Medication Instructions:  Your physician has recommended you make the following change in your medication:  Take Lasix  20 mg once daily for 2 days, then take as needed for  weight gain of 3 pds in 1 day or 5 pds in 1 week  *If you need a refill on your cardiac medications before your next appointment, please call your pharmacy*  Lab Work: Your physician recommends that you return for lab work in: Today for CMP and CBC Come back in 4 weeks and get a Fasting Lipid and CMP Lab opens at 8am. You DO NOT NEED an appointment. Best time to come is between 8am and 11:45 and between 1:30 and 4:30. If you have been asked to fast for your blood work please have nothing to eat or drink after midnight. You may have water .   If you have labs (blood work) drawn today and your tests are completely normal, you will receive your results only by: MyChart Message (if you have MyChart) OR A paper copy in the mail If you have any lab test that is abnormal or we need to change your treatment, we will call you to review the results.  Testing/Procedures: NONE  Follow-Up: At [facility], you and your health needs are our priority.  As part of our continuing mission to provide you with exceptional heart care, our providers are all part of one team.  This team includes your primary Cardiologist (physician) and Advanced Practice Providers or APPs (Physician Assistants and Nurse Practitioners) who all work together to provide you with the care you need, when you need it.  Your next appointment:   8 week(s)  Provider:   Redell Leiter, MD    We recommend signing up for the patient portal called MyChart.  Sign up information is provided on this After Visit Summary.  MyChart is used to connect with patients for Virtual Visits (Telemedicine).  Patients are able to view lab/test results, encounter notes, upcoming appointments, etc.  Non-urgent messages can be sent to your provider as well.   To learn more about  what you can do with MyChart, go to [email or web address].   Other Instructions

## 2023-12-07 ENCOUNTER — Ambulatory Visit: Payer: Self-pay | Admitting: Cardiology

## 2023-12-07 ENCOUNTER — Ambulatory Visit

## 2023-12-07 LAB — CBC WITH DIFFERENTIAL/PLATELET
Basophils Absolute: 0 x10E3/uL (ref 0.0–0.2)
Basos: 1 %
EOS (ABSOLUTE): 0.1 x10E3/uL (ref 0.0–0.4)
Eos: 4 %
Hematocrit: 33.4 % — ABNORMAL LOW (ref 37.5–51.0)
Hemoglobin: 10.6 g/dL — ABNORMAL LOW (ref 13.0–17.7)
Immature Grans (Abs): 0 x10E3/uL (ref 0.0–0.1)
Immature Granulocytes: 0 %
Lymphocytes Absolute: 1.1 x10E3/uL (ref 0.7–3.1)
Lymphs: 30 %
MCH: 29.8 pg (ref 26.6–33.0)
MCHC: 31.7 g/dL (ref 31.5–35.7)
MCV: 94 fL (ref 79–97)
Monocytes Absolute: 0.3 x10E3/uL (ref 0.1–0.9)
Monocytes: 9 %
Neutrophils Absolute: 2.1 x10E3/uL (ref 1.4–7.0)
Neutrophils: 56 %
Platelets: 186 x10E3/uL (ref 150–450)
RBC: 3.56 x10E6/uL — ABNORMAL LOW (ref 4.14–5.80)
RDW: 14.4 % (ref 11.6–15.4)
WBC: 3.7 x10E3/uL (ref 3.4–10.8)

## 2023-12-07 LAB — COMPREHENSIVE METABOLIC PANEL WITH GFR
ALT: 35 IU/L (ref 0–44)
AST: 18 IU/L (ref 0–40)
Albumin: 4.2 g/dL (ref 3.9–4.9)
Alkaline Phosphatase: 98 IU/L (ref 47–123)
BUN/Creatinine Ratio: 16 (ref 10–24)
BUN: 19 mg/dL (ref 8–27)
Bilirubin Total: 0.5 mg/dL (ref 0.0–1.2)
CO2: 22 mmol/L (ref 20–29)
Calcium: 8.7 mg/dL (ref 8.6–10.2)
Chloride: 104 mmol/L (ref 96–106)
Creatinine, Ser: 1.21 mg/dL (ref 0.76–1.27)
Globulin, Total: 2.2 g/dL (ref 1.5–4.5)
Glucose: 87 mg/dL (ref 70–99)
Potassium: 5.2 mmol/L (ref 3.5–5.2)
Sodium: 138 mmol/L (ref 134–144)
Total Protein: 6.4 g/dL (ref 6.0–8.5)
eGFR: 66 mL/min/1.73

## 2023-12-12 ENCOUNTER — Telehealth: Payer: Self-pay | Admitting: *Deleted

## 2023-12-12 NOTE — Telephone Encounter (Signed)
-----   Message from Nurse Bernardino B sent at 12/12/2023 10:17 AM EDT ----- Hey girls,   Can one of you refer this patient to Cardiac Rehab per Dr. Daniel?  Thanks,  Bernardino

## 2023-12-12 NOTE — Telephone Encounter (Signed)
 Ambulatory referral for cardiac rehab faxed to [facility] in [place] per Dr. Daniel.

## 2023-12-18 ENCOUNTER — Ambulatory Visit (INDEPENDENT_AMBULATORY_CARE_PROVIDER_SITE_OTHER): Admitting: Family Medicine

## 2023-12-18 ENCOUNTER — Encounter: Payer: Self-pay | Admitting: Family Medicine

## 2023-12-18 VITALS — BP 119/59 | HR 65 | Ht 68.0 in | Wt 211.0 lb

## 2023-12-18 DIAGNOSIS — E782 Mixed hyperlipidemia: Secondary | ICD-10-CM | POA: Diagnosis not present

## 2023-12-18 DIAGNOSIS — I2510 Atherosclerotic heart disease of native coronary artery without angina pectoris: Secondary | ICD-10-CM

## 2023-12-18 DIAGNOSIS — Z951 Presence of aortocoronary bypass graft: Secondary | ICD-10-CM | POA: Diagnosis not present

## 2023-12-18 DIAGNOSIS — I10 Essential (primary) hypertension: Secondary | ICD-10-CM

## 2023-12-18 MED ORDER — METOPROLOL TARTRATE 25 MG PO TABS: 25.0000 mg | ORAL_TABLET | Freq: Two times a day (BID) | ORAL | 1 refills | Status: DC

## 2023-12-18 NOTE — Progress Notes (Signed)
 Established Patient Office Visit  Subjective   Patient ID: Anthony Boone, male    DOB: Oct 29, 1955  Age: 68 y.o. MRN: [id number]  Chief Complaint  Patient presents with   Medical Management of Chronic Issues    HPI  Discussed the use of AI scribe software for clinical note transcription with the patient, who gave verbal consent to proceed.  History of Present Illness Anthony Boone is a 68 year old male with coronary artery disease who presents for follow-up after recent coronary artery bypass grafting.  He underwent cardiac catheterization on September 3rd, leading to a three-vessel coronary artery bypass grafting on September 4th. Post-surgery, he experienced a syncopal episode on September 11th, after which his Lasix  dosage was reduced to as needed. His home blood pressure readings have been around 133/73 mmHg.  He is currently taking lisinopril  and metoprolol . Post-surgery, he experienced atrial fibrillation, which was monitored and resolved.  He has difficulty resting due to concerns about his mother's health, who is in a rehab facility following a fall and hip fracture. He visits her daily, impacting his ability to rest and recover. He is delaying cardiac rehabilitation to care for his mother, who has dementia and requires his presence for reassurance.  He experiences swelling in his right leg, where veins were harvested for the bypass graft, and describes numbness in the area. He uses Lasix  every other day to manage swelling. No tingling is reported.  He wakes up once at night to urinate, which he attributes to Lasix , and has reduced his coffee intake to 16 ounces per day to manage caffeine consumption.       From last cardiology note: He established with [facility] earlier this year after a recent traumatic event where he had a CT scan revealing coronary artery calcifications.  He also had symptoms of exertional dyspnea and chest pain, also possibly a syncopal event as  well.  He underwent a coronary CTA revealing a calcium  score 1393, extensive T PV and FFR demonstrating high likelihood of hemodynamic significant stenosis in multiple vessels.  He underwent a left heart cath on 11/15/2023 revealing critical left main disease >>  underwent three-vessel CABG on 11/16/2023 developing short runs of atrial fibrillation postoperatively and he was started on IV amiodarone  >> converting to sinus rhythm and transition to p.o. amiodarone .  He developed thrombocytopenia and was transfused with 1 unit of FFP.  Remainder of his hospitalization was overall uncomplicated and he was discharged on postop day 5.  On 11/23/2023 he was apparently trying to get up off of his couch, became diaphoretic, near syncopal event and was brought to the hospital where he was found to have positive orthostatic vital signs, Lasix  was discontinued and he was advised to increase his oral hydration.         ROS All review of systems negative except what is listed in the HPI    Objective:     BP (!) 119/59   Pulse 65   Ht 5' 8 (1.727 m)   Wt 211 lb (95.7 kg)   SpO2 97%   BMI 32.08 kg/m    Physical Exam Vitals reviewed.  Constitutional:      Appearance: Normal appearance.  Cardiovascular:     Rate and Rhythm: Normal rate and regular rhythm.     Comments: CABG incisions healing nicely, no signs of infection Pulmonary:     Effort: Pulmonary effort is normal.     Breath sounds: Normal breath sounds.  Musculoskeletal:  Right lower leg: No edema.     Left lower leg: No edema.  Skin:    General: Skin is warm and dry.  Neurological:     Mental Status: He is alert and oriented to person, place, and time.  Psychiatric:        Mood and Affect: Mood normal.        Behavior: Behavior normal.        Thought Content: Thought content normal.        Judgment: Judgment normal.       No results found for any visits on 12/18/23.    The 10-year ASCVD risk score (Arnett DK, et al.,  2019) is: 15.4%    Assessment & Plan:   Problem List Items Addressed This Visit       S/P CABG x 3 - Primary Hyperlipidemia Hypertension Coronary artery disease   Following with cardiology - saw them for hospital follow-up last week. Due to follow-up with Dr. Daniel next week.  No symptoms. Denies chest pain and dyspnea. Reports occasional right leg swelling (graft site), but none noted on exam today. Lasix  reduced to PRN (usually every other day) dose due to orthostatic hypotension. Vitals stable, monitoring BP at home.  - Continue current meds and keep specialist follow-up appointments.  - Continue with lifting restrictions until cleared by cardiology - Encouraged cardiac rehab - he is hoping to start next month (currently caring for his mom, hip fracture) - Continue heart healthy lifestyle  - Labs recently updated and stable      Relevant Medications   metoprolol  tartrate (LOPRESSOR ) 25 MG tablet       Return in about 4 months (around 04/19/2024) for chronic disease management.    Anthony Boone Mon, NP

## 2023-12-18 NOTE — Assessment & Plan Note (Signed)
 Following with cardiology - saw them for hospital follow-up last week. Due to follow-up with Dr. Daniel next week.  No symptoms. Denies chest pain and dyspnea. Reports occasional right leg swelling (graft site), but none noted on exam today. Lasix  reduced to PRN (usually every other day) dose due to orthostatic hypotension. Vitals stable, monitoring BP at home.  - Continue current meds and keep specialist follow-up appointments.  - Continue with lifting restrictions until cleared by cardiology - Encouraged cardiac rehab - he is hoping to start next month (currently caring for his mom, hip fracture) - Continue heart healthy lifestyle  - Labs recently updated and stable

## 2023-12-21 ENCOUNTER — Ambulatory Visit: Admitting: Cardiology

## 2023-12-28 ENCOUNTER — Ambulatory Visit: Payer: Self-pay

## 2023-12-28 VITALS — BP 150/80 | HR 60 | Resp 20 | Ht 68.0 in | Wt 215.0 lb

## 2023-12-28 DIAGNOSIS — Z951 Presence of aortocoronary bypass graft: Secondary | ICD-10-CM

## 2023-12-28 NOTE — Progress Notes (Signed)
      [address] [place] [address]             [phone]    HPI:  Patient returns for routine postoperative follow-up having undergone CABG x 3 on 11/16/23.  He is now 6 weeks post op. He has been driving and taking care of his mom who is in rehab after a hip fracture.  At his last visit he was reporting some orthostasis and disorientation with standing but that has completely resolved.  He also reported some popping sensations in his chest with laying on his side, but this has also resolved.  He is remaining active and weight is stable. He is taking lasix  PRN.  He did not take his BP meds this morning, but has been running 130s/70s at home.  Pain medication: None  Bowel function: Normal   Allergies as of 12/28/2023   No Known Allergies      Medication List        Accurate as of December 28, 2023  9:26 AM. If you have any questions, ask your nurse or doctor.          ascorbic acid 500 MG tablet Commonly known as: VITAMIN C Take 1,000 mg by mouth daily.   aspirin  EC 81 MG tablet Take 81 mg by mouth daily. Swallow whole.   furosemide  20 MG tablet Commonly known as: LASIX  Take 1 tablet (20 mg total) by mouth daily as needed for edema (for weight gain of 3 pds in 1 day or 5 pds in 1 week).   lisinopril  20 MG tablet Commonly known as: ZESTRIL  Take 1 tablet (20 mg total) by mouth daily for blood pressure   metoprolol  tartrate 25 MG tablet Commonly known as: LOPRESSOR  Take 1 tablet (25 mg total) by mouth 2 (two) times daily.   rosuvastatin  20 MG tablet Commonly known as: CRESTOR  Take 1 tablet (20 mg total) by mouth daily.        BP (!) 150/80   Pulse 60   Resp 20   Ht 5' 8 (1.727 m)   Wt 215 lb (97.5 kg)   SpO2 97% Comment: RA  BMI 32.69 kg/m   Physical Exam: General - Well-appearing, no distress CV - Incision CDI. Sternum feels stable. Resp - chest tube sites healing well Abd - Soft, ND/NT Ext - Very trace lower extremity  edema  Imaging: No new imaging  Assessment/Plan: Mr. Artman is a 68 year old man s/p CABG x 3 on 9/4 who is progressing well.  He is driving and helping take care of his mom in rehab.  He will be starting cardiac rehab on 10/21.  He can continue to increase lifting capacity, continue driving.  I advised him no direct sunlight to his incision for 3 months.  He will continue follow-up with his cardiologist, just return to clinic PRN.  Con GORMAN [name], MD 9:26 AM 12/28/23

## 2024-01-02 DIAGNOSIS — Z951 Presence of aortocoronary bypass graft: Secondary | ICD-10-CM | POA: Diagnosis not present

## 2024-01-02 DIAGNOSIS — I2510 Atherosclerotic heart disease of native coronary artery without angina pectoris: Secondary | ICD-10-CM | POA: Diagnosis not present

## 2024-01-03 DIAGNOSIS — I25118 Atherosclerotic heart disease of native coronary artery with other forms of angina pectoris: Secondary | ICD-10-CM | POA: Diagnosis not present

## 2024-01-03 DIAGNOSIS — R609 Edema, unspecified: Secondary | ICD-10-CM | POA: Diagnosis not present

## 2024-01-03 DIAGNOSIS — Z79899 Other long term (current) drug therapy: Secondary | ICD-10-CM | POA: Diagnosis not present

## 2024-01-04 LAB — COMPREHENSIVE METABOLIC PANEL WITH GFR
ALT: 21 IU/L (ref 0–44)
AST: 19 IU/L (ref 0–40)
Albumin: 4.5 g/dL (ref 3.9–4.9)
Alkaline Phosphatase: 83 IU/L (ref 47–123)
BUN/Creatinine Ratio: 13 (ref 10–24)
BUN: 15 mg/dL (ref 8–27)
Bilirubin Total: 0.3 mg/dL (ref 0.0–1.2)
CO2: 24 mmol/L (ref 20–29)
Calcium: 9.2 mg/dL (ref 8.6–10.2)
Chloride: 106 mmol/L (ref 96–106)
Creatinine, Ser: 1.14 mg/dL (ref 0.76–1.27)
Globulin, Total: 2.3 g/dL (ref 1.5–4.5)
Glucose: 92 mg/dL (ref 70–99)
Potassium: 4.9 mmol/L (ref 3.5–5.2)
Sodium: 143 mmol/L (ref 134–144)
Total Protein: 6.8 g/dL (ref 6.0–8.5)
eGFR: 70 mL/min/1.73

## 2024-01-04 LAB — LIPID PANEL
Chol/HDL Ratio: 2.8 ratio (ref 0.0–5.0)
Cholesterol, Total: 97 mg/dL — ABNORMAL LOW (ref 100–199)
HDL: 35 mg/dL — ABNORMAL LOW
LDL Chol Calc (NIH): 44 mg/dL (ref 0–99)
Triglycerides: 90 mg/dL (ref 0–149)
VLDL Cholesterol Cal: 18 mg/dL (ref 5–40)

## 2024-01-09 ENCOUNTER — Encounter: Payer: Medicare Other | Admitting: Internal Medicine

## 2024-01-10 DIAGNOSIS — I2510 Atherosclerotic heart disease of native coronary artery without angina pectoris: Secondary | ICD-10-CM | POA: Diagnosis not present

## 2024-01-10 DIAGNOSIS — Z951 Presence of aortocoronary bypass graft: Secondary | ICD-10-CM | POA: Diagnosis not present

## 2024-01-11 DIAGNOSIS — Z951 Presence of aortocoronary bypass graft: Secondary | ICD-10-CM | POA: Diagnosis not present

## 2024-01-11 DIAGNOSIS — I2510 Atherosclerotic heart disease of native coronary artery without angina pectoris: Secondary | ICD-10-CM | POA: Diagnosis not present

## 2024-01-11 NOTE — Telephone Encounter (Signed)
-----   Message from Delon JAYSON Hoover sent at 01/05/2024  7:36 AM EDT ----- Lipids are well-controlled.  Electrolytes look good, kidney function and liver function is normal.  Continue current medications. ----- Message ----- From: Interface, [facility] Lab Results In Sent: 12/07/2023   5:39 AM EDT To: Delon JAYSON Hoover, NP

## 2024-01-11 NOTE — Telephone Encounter (Signed)
 Spoke with wife, Charlies (DPR) and let her know blood work done in our office was all normal.She verbalized understanding and had no further questions.

## 2024-01-17 DIAGNOSIS — Z951 Presence of aortocoronary bypass graft: Secondary | ICD-10-CM | POA: Diagnosis not present

## 2024-01-17 DIAGNOSIS — I2510 Atherosclerotic heart disease of native coronary artery without angina pectoris: Secondary | ICD-10-CM | POA: Diagnosis not present

## 2024-01-24 DIAGNOSIS — I2510 Atherosclerotic heart disease of native coronary artery without angina pectoris: Secondary | ICD-10-CM | POA: Diagnosis not present

## 2024-01-24 DIAGNOSIS — Z951 Presence of aortocoronary bypass graft: Secondary | ICD-10-CM | POA: Diagnosis not present

## 2024-01-25 DIAGNOSIS — Z951 Presence of aortocoronary bypass graft: Secondary | ICD-10-CM | POA: Diagnosis not present

## 2024-01-25 DIAGNOSIS — I2510 Atherosclerotic heart disease of native coronary artery without angina pectoris: Secondary | ICD-10-CM | POA: Diagnosis not present

## 2024-01-31 DIAGNOSIS — Z951 Presence of aortocoronary bypass graft: Secondary | ICD-10-CM | POA: Diagnosis not present

## 2024-01-31 DIAGNOSIS — I2510 Atherosclerotic heart disease of native coronary artery without angina pectoris: Secondary | ICD-10-CM | POA: Diagnosis not present

## 2024-01-31 NOTE — Telephone Encounter (Signed)
 [name]

## 2024-02-01 ENCOUNTER — Ambulatory Visit: Payer: Self-pay

## 2024-02-01 ENCOUNTER — Ambulatory Visit

## 2024-02-01 ENCOUNTER — Ambulatory Visit (INDEPENDENT_AMBULATORY_CARE_PROVIDER_SITE_OTHER): Admitting: Family Medicine

## 2024-02-01 VITALS — BP 152/86 | HR 73 | Temp 98.2°F | Ht 67.0 in | Wt 212.8 lb

## 2024-02-01 DIAGNOSIS — J90 Pleural effusion, not elsewhere classified: Secondary | ICD-10-CM

## 2024-02-01 DIAGNOSIS — I10 Essential (primary) hypertension: Secondary | ICD-10-CM | POA: Diagnosis not present

## 2024-02-01 DIAGNOSIS — Z951 Presence of aortocoronary bypass graft: Secondary | ICD-10-CM | POA: Diagnosis not present

## 2024-02-01 DIAGNOSIS — R1013 Epigastric pain: Secondary | ICD-10-CM | POA: Diagnosis not present

## 2024-02-01 DIAGNOSIS — J40 Bronchitis, not specified as acute or chronic: Secondary | ICD-10-CM

## 2024-02-01 DIAGNOSIS — I25810 Atherosclerosis of coronary artery bypass graft(s) without angina pectoris: Secondary | ICD-10-CM | POA: Diagnosis not present

## 2024-02-01 DIAGNOSIS — K319 Disease of stomach and duodenum, unspecified: Secondary | ICD-10-CM

## 2024-02-01 DIAGNOSIS — R059 Cough, unspecified: Secondary | ICD-10-CM | POA: Diagnosis not present

## 2024-02-01 DIAGNOSIS — I2510 Atherosclerotic heart disease of native coronary artery without angina pectoris: Secondary | ICD-10-CM | POA: Diagnosis not present

## 2024-02-01 MED ORDER — PANTOPRAZOLE SODIUM 40 MG PO TBEC: 40.0000 mg | DELAYED_RELEASE_TABLET | Freq: Every day | ORAL | 0 refills | Status: DC

## 2024-02-01 MED ORDER — AMLODIPINE BESYLATE 5 MG PO TABS: 5.0000 mg | ORAL_TABLET | Freq: Every day | ORAL | 0 refills | Status: DC

## 2024-02-01 NOTE — Telephone Encounter (Signed)
 FYI Only or Action Required?: FYI only for provider: Appt today at [facility].  Patient was last seen in primary care on 12/18/2023 by Almarie Waddell NOVAK, NP.  Called Nurse Triage reporting Vomiting. PT has had several instances of abdominal swelling and vomiting since sx in September. PT will eat, abdomen will become very swollen and painful. Pt will vomit and then feels better.  Symptoms began Since surgery.  Interventions attempted: Nothing.  Symptoms are: gradually worsening.  Triage Disposition: See HCP Within 4 Hours (Or PCP Triage)  Patient/caregiver understands and will follow disposition?: Yes                       Copied from CRM [id number]. Topic: Clinical - Red Word Triage >> Feb 01, 2024  8:03 AM Mercedes MATSU wrote: Red Word that prompted transfer to Nurse Triage: Patients wife Havoc Sanluis, called in on behalf of the patient. She states that, the Patient had triple heart surgery on 09/04, and since then he has been having issues when he eats. It causes his stomach to get real distended and hurts, then the patient vomits. After he vomits he feels better, last night the patient ate a handful of grapes and within 30 mins his stomach was distended and he was in a lot of pain vomiting 2-3 more times and very sweaty. He has a appointment with his cardiologist on Monday, but he wanted to speak with the nurse because him and his wife are very concerned. Reason for Disposition  [1] Vomiting AND [2] abdomen looks much more swollen than usual  Answer Assessment - Initial Assessment Questions 1. VOMITING SEVERITY: How many times have you vomited in the past 24 hours?      1x - 3 times with this even 2. ONSET: When did the vomiting begin?      Started several weeks after Sx 4. ABDOMEN PAIN: Are your having any abdomen pain? If Yes : How bad is it and what does it feel like? (e.g., crampy, dull, intermittent, constant)      Abdomen is very distended and then  vomits.  7. CAUSE: What do you think is causing your vomiting?     Unsure 9. OTHER SYMPTOMS: Do you have any other symptoms? (e.g., fever, headache, vertigo, vomiting blood or coffee grounds, recent head injury)     Abdominal pain and bloating  Protocols used: Vomiting-A-AH

## 2024-02-01 NOTE — Progress Notes (Signed)
 Assessment & Plan   Assessment/Plan:  Assessment and Plan Assessment & Plan Gastroesophageal reflux disease with bloating and vomiting Intermittent episodes of bloating and vomiting postprandially, likely related to GERD. Symptoms include epigastric and left upper quadrant pain, bloating, and regurgitation of clear fluid, not bile. Symptoms exacerbated by fatty foods and alcohol . Differential includes cardiovascular causes, but improvement with cardiac rehab suggests a GI etiology. - Prescribed Protonix  40 mg daily, to be taken 30 minutes before breakfast. - Referred to gastroenterology. - Advised to eat smaller meals, avoid caffeine and alcohol , and avoid lying down late at night. - Recommended elevating the head of the bed. - Instructed to follow up if no improvement or if symptoms worsen.  Hypertension Blood pressure mildly elevated at 152/86, likely due to recent discomfort. Current medications include lisinopril  and metoprolol . Blood pressure management is crucial for cardiac health. - Added amlodipine 5 mg daily to current antihypertensive regimen. - Follow up with primary care for ongoing hypertension management.  Coronary artery disease, post-CABG Status post coronary artery bypass grafting on September 4th, 2025. Currently undergoing cardiac rehab with good tolerance and no chest pain or shortness of breath. Heart rhythm well-managed with amiodarone . - Continue cardiac rehab. - Follow up with cardiologist for heart rhythm management.  Obesity BMI of 33.33. Obesity may contribute to GERD symptoms. - Advised dietary modifications to include smaller meals and more whole fiber vegetables.  Bronchitis, resolving Recent bronchitis treated with amoxicillin and Mucinex DM. Symptoms include cough and clear mucus production. No current wheezing or crackles on lung exam. - Continue Mucinex DM for cough management. - Avoid alcohol -containing cough remedies.  Left pleural effusion,  small, post-operative Small left pleural effusion noted on previous imaging. No current respiratory distress or significant lung findings on exam. - Ordered chest x-ray to evaluate current status of pleural effusion.       There are no discontinued medications.  Return if symptoms worsen or fail to improve.        Subjective:   Encounter date: 02/01/2024  Anthony Boone is a 68 y.o. male who has Hyperlipidemia; Hypertension; Other abnormal glucose; Vitamin D  deficiency; Medication management; Obesity (BMI 30.0-34.9); Erectile dysfunction; Former smoker; Coronary artery calcification seen on CAT scan; Intermediate coronary syndrome (HCC); Coronary artery disease; S/P CABG x 3; Paroxysmal atrial fibrillation (HCC); 3-vessel CAD; Syncope and collapse; Pleural effusion on left; and Dyspepsia and disorder of function of stomach on their problem list..   He  has a past medical history of 3-vessel CAD (11/20/2023), Coronary artery calcification seen on CAT scan (10/09/2023), Coronary artery disease (11/16/2023), Erectile dysfunction (01/12/2018), Former smoker (01/15/2018), Hyperlipidemia, Hypertension, Intermediate coronary syndrome (HCC) (11/15/2023), Medication management (07/29/2013), Obesity (BMI 30.0-34.9) (01/12/2018), Other abnormal glucose (07/29/2013), Paroxysmal atrial fibrillation (HCC) (11/20/2023), S/P CABG x 3 (11/20/2023), Syncope and collapse (11/23/2023), and Vitamin D  deficiency.[name]   He presents with chief complaint of Acute Visit (Patient presents today w/ vomiting after recent surgery. Patient states he has vomited 3x since his surgery on Sept 4th. Last night he had a handful of grapes and he had a bloating episode and regurgitated bile. Pt state he ate a rib eye steak 4 weeks ago and bloating episode and regurgitated bile again, he took some pepto bismo and he work 4 weeks ago but the pepto bismo didn't help lastnight. The pain is on left upper quad) .  Discussed the use of  AI scribe software for clinical note transcription with the patient, who gave verbal consent to proceed.  History  of Present Illness Anthony Boone is a 68 year old male who presents with abdominal symptoms post-coronary artery bypass graft surgery.  Postoperative gastrointestinal symptoms - Three episodes of vomiting and indigestion since coronary artery bypass graft surgery on November 16, 2023 - Most recent episode occurred after consuming several grapes, resulting in bloating and regurgitation of clear emesis, initially described as 'bile' - Similar episode four weeks ago after eating ribeye steak, associated with bloating and regurgitation - Pepto Bismol provided partial relief during earlier episodes but was ineffective during the most recent incident - Epigastric and left upper quadrant abdominal pain - No green mucus or blood in vomitus - No regular alcohol  or caffeine consumption - Past experience of indigestion prior to heart surgery, but current symptoms are different in character  Respiratory symptoms - Bronchitis for the past three weeks - Currently taking amoxicillin and Mucinex - Clear mucus production - No green mucus - No chest pain, shortness of breath, or wheezing during cardiac rehabilitation sessions  Autonomic and constitutional symptoms - No fever - Cold sweats and sensation of freezing during the most recent gastrointestinal episode  Bowel habits - Two episodes of diarrhea since hospital discharge, not coinciding with abdominal symptoms  Medication and substance use - Current medications: amiodarone  200 mg daily, aspirin  81 mg daily, metoprolol  25 mg daily, Lasix  20 mg PRN, lisinopril  20 mg daily, amoxicillin 875 mg (half a pill daily for bronchitis) - Use of homemade remedy containing a small amount of alcohol  for bronchitis, not used in the past three to four days  ROS  Past Surgical History:  Procedure Laterality Date   APPENDECTOMY  1974   CORONARY  ARTERY BYPASS GRAFT N/A 11/16/2023   Procedure: CORONARY ARTERY BYPASS GRAFTING (CABG) TIMES THREE USING LEFT INTERNAL MAMMARY ARTERY AND ENDOSCOPICALLY HARVESTED RIGHT GREATER SAPHENOUS VEIN;  Surgeon: Daniel Con RAMAN, MD;  Location: [facility] OR;  Service: Open Heart Surgery;  Laterality: N/A;  Add Left Radial artery   INTRAOPERATIVE TRANSESOPHAGEAL ECHOCARDIOGRAM N/A 11/16/2023   Procedure: ECHOCARDIOGRAM, TRANSESOPHAGEAL, INTRAOPERATIVE;  Surgeon: Daniel Con RAMAN, MD;  Location[facility] OR;  Service: Open Heart Surgery;  Laterality: N/A;   LEFT HEART CATH AND CORONARY ANGIOGRAPHY N/A 11/15/2023   Procedure: LEFT HEART CATH AND CORONARY ANGIOGRAPHY;  Surgeon: Ladona Heinz, MD;  Location: [facility] INVASIVE CV LAB;  Service: Cardiovascular;  Laterality: N/A;   VASECTOMY  2005   Dr Donelle    Current Outpatient Medications on File Prior to Visit  Medication Sig Dispense Refill   amiodarone  (PACERONE ) 200 MG tablet Take 200 mg by mouth daily.     ascorbic acid (VITAMIN C) 500 MG tablet Take 1,000 mg by mouth daily.     aspirin  EC 81 MG tablet Take 81 mg by mouth daily. Swallow whole.     furosemide  (LASIX ) 20 MG tablet Take 1 tablet (20 mg total) by mouth daily as needed for edema (for weight gain of 3 pds in 1 day or 5 pds in 1 week). 45 tablet 5   lisinopril  (ZESTRIL ) 20 MG tablet Take 1 tablet (20 mg total) by mouth daily for blood pressure 30 tablet 1   metoprolol  tartrate (LOPRESSOR ) 25 MG tablet Take 1 tablet (25 mg total) by mouth 2 (two) times daily. 60 tablet 1   rosuvastatin  (CRESTOR ) 20 MG tablet Take 1 tablet (20 mg total) by mouth daily. 90 tablet 1   No current facility-administered medications on file prior to visit.    Family History  Problem Relation Age  of Onset   Heart disease Father    Hypertension Brother     Social History   Socioeconomic History   Marital status: Married    Spouse name: Not on file   Number of children: Not on file   Years of education: Not on file   Highest education  level: Not on file  Occupational History   Not on file  Tobacco Use   Smoking status: Former    Current packs/day: 0.00    Average packs/day: 1.5 packs/day for 29.0 years (43.5 ttl pk-yrs)    Types: Cigarettes    Start date: 14    Quit date: 81    Years since quitting: 31.9   Smokeless tobacco: Never  Substance and Sexual Activity   Alcohol  use: Yes   Drug use: No   Sexual activity: Not on file  Other Topics Concern   Not on file  Social History Narrative   Not on file   Social Drivers of Health   Financial Resource Strain: Low Risk  (09/24/2023)   Received from [facility]   Overall Financial Resource Strain (CARDIA)    How hard is it for you to pay for the very basics like food, housing, medical care, and heating?: Not hard at all  Food Insecurity: No Food Insecurity (11/24/2023)   Hunger Vital Sign    Worried About Running Out of Food in the Last Year: Never true    Ran Out of Food in the Last Year: Never true  Transportation Needs: No Transportation Needs (11/24/2023)   PRAPARE - Administrator, Civil Service (Medical): No    Lack of Transportation (Non-Medical): No  Physical Activity: Insufficiently Active (09/24/2023)   Received from [facility]   Exercise Vital Sign    On average, how many days per week do you engage in moderate to strenuous exercise (like a brisk walk)?: 2 days    On average, how many minutes do you engage in exercise at this level?: 30 min  Stress: No Stress Concern Present (09/24/2023)   Received from [facility] - Occupational Stress Questionnaire    Do you feel stress - tense, restless, nervous, or anxious, or unable to sleep at night because your mind is troubled all the time - these days?: Not at all  Recent Concern: Stress - Stress Concern Present (09/17/2023)   Received from [facility] of [facility] - Occupational Stress Questionnaire    Feeling of  Stress : To some extent  Social Connections: Socially Integrated (11/24/2023)   Social Connection and Isolation Panel    Frequency of Communication with Friends and Family: More than three times a week    Frequency of Social Gatherings with Friends and Family: More than three times a week    Attends Religious Services: More than 4 times per year    Active Member of [employer] or Organizations: Yes    Attends Banker Meetings: More than 4 times per year    Marital Status: Married  Catering Manager Violence: Not At Risk (11/24/2023)   Humiliation, Afraid, Rape, and Kick questionnaire    Fear of Current or Ex-Partner: No    Emotionally Abused: No    Physically Abused: No    Sexually Abused: No  Objective:  Physical Exam: BP (!) 152/86   Pulse 73   Temp 98.2 F (36.8 C)   Ht 5' 7 (1.702 m)   Wt 212 lb 12.8 oz (96.5 kg)   SpO2 96%   BMI 33.33 kg/m    Physical Exam           Physical Exam Constitutional:      Appearance: Normal appearance.  HENT:     Head: Normocephalic and atraumatic.     Right Ear: Hearing normal.     Left Ear: Hearing normal.     Nose: Nose normal.  Eyes:     General: No scleral icterus.       Right eye: No discharge.        Left eye: No discharge.     Extraocular Movements: Extraocular movements intact.  Cardiovascular:     Rate and Rhythm: Normal rate and regular rhythm.     Heart sounds: Normal heart sounds.  Pulmonary:     Effort: Pulmonary effort is normal.     Breath sounds: Normal breath sounds.  Abdominal:     Palpations: Abdomen is soft.     Tenderness: There is no abdominal tenderness.  Skin:    General: Skin is warm.     Findings: No rash.  Neurological:     General: No focal deficit present.     Mental Status: He is alert.     Cranial Nerves: No cranial nerve deficit.  Psychiatric:        Mood and Affect: Mood normal.         Behavior: Behavior normal.        Thought Content: Thought content normal.        Judgment: Judgment normal.     DG Chest 2 View Result Date: 11/30/2023 EXAM: 2 VIEW(S) XRAY OF THE CHEST 11/30/2023 11:59:31 AM COMPARISON: 11/23/2023 CLINICAL HISTORY: CABG FINDINGS: LUNGS AND PLEURA: Small left pleural effusion with atelectasis at left lung base. HEART AND MEDIASTINUM: CABG markers noted. Sternotomy wires noted. BONES AND SOFT TISSUES: No acute osseous abnormality. IMPRESSION: 1. Small left pleural effusion with atelectasis at left lung base. Electronically signed by: Donnice Mania MD 11/30/2023 12:28 PM EDT RP Workstation: [id number]   CT Cervical Spine Wo Contrast Result Date: 11/23/2023 EXAM: CT CERVICAL SPINE WITHOUT CONTRAST 11/23/2023 12:02:53 PM TECHNIQUE: CT of the cervical spine was performed without the administration of intravenous contrast. Multiplanar reformatted images are provided for review. Automated exposure control, iterative reconstruction, and/or weight based adjustment of the mA/kV was utilized to reduce the radiation dose to as low as reasonably achievable. COMPARISON: None available. CLINICAL HISTORY: Polytrauma, blunt. Pt states he had open heart surgery last week and he woke up in a cold sweat this morning, pt sat up on the side of couch and passed out into the floor, pt states when he woke up his nose was bleeding. Pt has no complaints at this time. FINDINGS: CERVICAL SPINE: BONES AND ALIGNMENT: No acute fracture or traumatic malalignment. Straightening and slight reversal of the normal cervical lordosis is present. DEGENERATIVE CHANGES: Moderate foraminal narrowing secondary to bilateral uncovertebral spurring at C4-5. Moderate foraminal narrowing at C5-6 is worse on the right. Left foraminal narrowing is present at C6-7. Asymmetric advanced facet hypertrophy is present on the left at C2-3 resulting in a moderate left foraminal stenosis at C2-3. SOFT TISSUES: No prevertebral  soft tissue swelling. IMPRESSION: 1. No acute abnormality of the cervical spine related to the provided clinical history. 2. Straightening and  slight reversal of the normal cervical lordosis. 3. Moderate left foraminal stenosis at C2-3 due to asymmetric advanced facet hypertrophy on the left. 4. Moderate foraminal narrowing at C4-5 secondary to bilateral uncovertebral spurring. 5. Moderate foraminal narrowing at C5-6, worse on the right. 6. Left foraminal narrowing at C6-7. Electronically signed by: Lonni Necessary MD 11/23/2023 12:10 PM EDT RP Workstation: [id number]   CT Head Wo Contrast Result Date: 11/23/2023 EXAM: CT HEAD WITHOUT CONTRAST 11/23/2023 12:02:53 PM TECHNIQUE: CT of the head was performed without the administration of intravenous contrast. Automated exposure control, iterative reconstruction, and/or weight based adjustment of the mA/kV was utilized to reduce the radiation dose to as low as reasonably achievable. COMPARISON: None available. CLINICAL HISTORY: Polytrauma, blunt. Table formatting from the original note was not included. Pt states he had open heart surgery last week and he woke up in a cold sweat this morning, pt sat up on the side of couch and passed out into the floor, pt states when he woke up his nose was bleeding. Pt has no complaints at this time. FINDINGS: BRAIN AND VENTRICLES: No acute hemorrhage. No evidence of acute infarct. No hydrocephalus. No extra-axial collection. No mass effect or midline shift. Atherosclerotic calcifications are present in the cavernous carotid arteries bilaterally. No hyperdense vessel is present. ORBITS: No acute abnormality. SINUSES: No acute abnormality. SOFT TISSUES AND SKULL: No acute soft tissue abnormality. No skull fracture. IMPRESSION: 1. No acute intracranial abnormality. Electronically signed by: Lonni Necessary MD 11/23/2023 12:08 PM EDT RP Workstation: [id number]   DG Chest 2 View Result Date: 11/23/2023 CLINICAL DATA:   Syncope. EXAM: CHEST - 2 VIEW COMPARISON:  November 20, 2023. FINDINGS: Stable cardiomediastinal silhouette. Status post coronary bypass graft. Right lung is clear. Minimal left pleural effusion is noted with minimal adjacent subsegmental atelectasis. Bony thorax is unremarkable. IMPRESSION: Minimal left pleural effusion is noted with minimal adjacent subsegmental atelectasis. Electronically Signed   By: Lynwood Landy Raddle M.D.   On: 11/23/2023 11:58   DG Chest 2 View Result Date: 11/20/2023 EXAM: 2 VIEW(S) XRAY OF THE CHEST 11/20/2023 02:05:00 PM COMPARISON: 11/20/2023 CLINICAL HISTORY: Status post cardiac surgery [id number]. Reason for Exam: STATUS POST CARDIAC SURGERY FINDINGS: LUNGS AND PLEURA: Mild streaky atelectasis in left lung base, improved. Small left pleural effusion, unchanged. Trace right pleural effusion. HEART AND MEDIASTINUM: Post-CABG changes noted. BONES AND SOFT TISSUES: Intact sternotomy wires. IMPRESSION: 1. Small left pleural effusion, unchanged. 2. Mild streaky atelectasis in left lung base, improved. 3. Trace right pleural effusion. Electronically signed by: Donnice Mania MD 11/20/2023 05:57 PM EDT RP Workstation: [id number]   DG CHEST PORT 1 VIEW Result Date: 11/20/2023 CLINICAL DATA:  [id number].  Encounter for chest tube removal. EXAM: PORTABLE CHEST 1 VIEW COMPARISON:  Portable chest yesterday at 9:29 a.m. FINDINGS: 5:32 a.m. interval left basilar chest tube removal, with small increased left pleural effusion. There is no measurable pneumothorax. Increased linear left basilar atelectasis this same no focal consolidation. Intact median sternotomy sutures and stable CABG change. Mild cardiomegaly. No vascular congestion is seen. The mediastinum is stable. No new osseous abnormality. IMPRESSION: 1. Interval left basilar chest tube removal, with small increased left pleural effusion. No measurable pneumothorax. 2. Increased linear left basilar atelectasis. Electronically Signed   By: Francis Quam  M.D.   On: 11/20/2023 07:03   DG CHEST PORT 1 VIEW Result Date: 11/19/2023 CLINICAL DATA:  Status post cardiac surgery EXAM: PORTABLE CHEST 1 VIEW COMPARISON:  Chest radiograph dated 11/18/2023 FINDINGS: Lines/tubes: Interval removal of  right IJ sheath and mediastinal catheters. Unchanged left basilar pleural catheter. Lungs: Well inflated lungs. No focal consolidation. Pleura: Trace blunting of right costophrenic angle. Trace left apical pneumothorax. Heart/mediastinum: Similar enlarged cardiomediastinal silhouette. Bones: Median sternotomy wires are nondisplaced. IMPRESSION: 1.  Support apparatus as described. 2. Trace left apical pneumothorax with left basilar pleural catheter in place. 3. Trace right pleural effusion. Electronically Signed   By: Limin  Xu M.D.   On: 11/19/2023 10:52   DG Chest Port 1 View Result Date: 11/18/2023 CLINICAL DATA:  Status post cardiac surgery. EXAM: PORTABLE CHEST 1 VIEW COMPARISON:  11/17/2023 FINDINGS: Low lung volumes. The cardio pericardial silhouette is enlarged. Bibasilar atelectasis with probable tiny bilateral effusions. Pulmonary artery catheter is been removed in the interval. Right IJ sheath remains in place with possible kink. Left chest tube again noted without evidence for left-sided pneumothorax. Mediastinal/pericardial drains remain in place. Telemetry leads overlie the chest. IMPRESSION: 1. Low volume film with bibasilar atelectasis and probable tiny bilateral effusions. 2. Interval removal of pulmonary artery catheter. Right IJ sheath remains in place with possible kink. Electronically Signed   By: Camellia Candle M.D.   On: 11/18/2023 08:38   DG Chest Port 1 View Result Date: 11/17/2023 CLINICAL DATA:  Follow-up recent pneumothorax. EXAM: PORTABLE CHEST 1 VIEW COMPARISON:  11/16/2023 FINDINGS: The technologist mislabeled image with the left indicator placed on the right side of the image. The endotracheal tube has been removed. Stable right jugular Swan-Ganz  catheter with its tip in the right main pulmonary artery. Stable mediastinal and left chest tubes and post CABG changes. No pneumothorax. Stable poor inspiration and enlarged cardiac silhouette. Left basilar atelectasis without significant overall change. Left shoulder degenerative changes. IMPRESSION: 1. No pneumothorax. 2. Stable left basilar atelectasis. 3. Stable cardiomegaly. Electronically Signed   By: Elspeth Bathe M.D.   On: 11/17/2023 08:40   DG Chest Port 1 View Result Date: 11/16/2023 CLINICAL DATA:  [id number] Pneumothorax [id number] EXAM: PORTABLE CHEST 1 VIEW COMPARISON:  Chest x-ray 10/09/2023 FINDINGS: Right internal jugular venous approach Swan-Ganz catheter with tip overlying the expected region of the main pulmonary artery. Endotracheal tube with tip at the carina overlying the right mainstem bronchus. Two mediastinal drains noted. Enteric tube with tip and side port overlying the expected region of the gastric lumen. The heart and mediastinal contours are within normal limits. Low lung volumes. Bibasilar atelectasis. No focal consolidation. No pulmonary edema. No pleural effusion. No pneumothorax. No acute osseous abnormality. Interval placement of intact sternotomy wires. IMPRESSION: 1. Endotracheal tube with tip at the carina overlying the right mainstem bronchus. Recommend retraction by 2 cm. 2. Other lines and tubes as above. 3. No acute cardiopulmonary abnormality. These results will be called to the ordering clinician or representative by the Radiologist Assistant, and communication documented in the PACS or [employer]. Electronically Signed   By: Morgane  Naveau M.D.   On: 11/16/2023 16:06   ECHO INTRAOPERATIVE TEE Result Date: 11/16/2023  *INTRAOPERATIVE TRANSESOPHAGEAL REPORT *  Patient Name:   BRYDEN DARDEN Date of Exam: 11/16/2023 Medical Rec #:  [id number]      Height:       68.0 in Accession #:    [id number]     Weight:       215.0 lb Date of Birth:  12/18/1955      BSA:          2.11  m Patient Age:    67 years       BP:  189/69 mmHg Patient Gender: M              HR:           72 bpm. Exam Location:  Anesthesiology Transesophogeal exam was perform intraoperatively during surgical procedure. Patient was closely monitored under general anesthesia during the entirety of examination. Indications:     CABG Performing Phys: [id number] BAILEY S SU Complications: No known complications during this procedure. POST-OP IMPRESSIONS _ Right Ventricle: mildly reduced function. The cavity was mildly dilated. _ Aorta: The aorta appears unchanged from pre-bypass. _ Left Atrial Appendage: The left atrial appendage appears unchanged from pre-bypass. _ Aortic Valve: The aortic valve appears unchanged from pre-bypass. _ Mitral Valve: There is mild regurgitation. _ Tricuspid Valve: There is moderate regurgitation.Discussed with Dr. Daniel and Kerrin. _ Pulmonic Valve: The pulmonic valve appears unchanged from pre-bypass. PRE-OP FINDINGS  Left Ventricle: The left ventricle has normal systolic function, with an ejection fraction of 55-60%. The cavity size was normal. There is mild concentric left ventricular hypertrophy. Right Ventricle: The right ventricle has normal systolic function. The cavity was normal. There is no increase in right ventricular wall thickness. Left Atrium: Left atrial size was normal in size. No left atrial/left atrial appendage thrombus was detected. Right Atrium: Right atrial size was normal in size. Interatrial Septum: No atrial level shunt detected by color flow Doppler. There is no evidence of a patent foramen ovale. Pericardium: There is no evidence of pericardial effusion. Mitral Valve: The mitral valve is normal in structure. Mitral valve regurgitation is trivial by color flow Doppler. There is no evidence of mitral valve vegetation. There is No evidence of mitral stenosis. There is mild thickening present. Tricuspid Valve: The tricuspid valve was normal in structure. Tricuspid  valve regurgitation is trivial by color flow Doppler. There is no evidence of tricuspid valve vegetation. Aortic Valve: The aortic valve is normal in structure. Aortic valve regurgitation was not visualized by color flow Doppler. There is no stenosis of the aortic valve. There is no evidence of aortic valve vegetation. Pulmonic Valve: The pulmonic valve was normal in structure. Pulmonic valve regurgitation is trivial by color flow Doppler. Aorta: There is evidence of plaque in the descending aorta; Grade II, measuring 2-43mm in size. Shunts: There is no evidence of an atrial septal defect.  Norleen Pope MD Electronically signed by Norleen Pope MD Signature Date/Time: 11/16/2023/4:01:47 PM    Final    ECHOCARDIOGRAM COMPLETE Result Date: 11/15/2023    ECHOCARDIOGRAM REPORT   Patient Name:   KAZMIR OKI Date of Exam: 11/15/2023 Medical Rec #:  [id number]      Height:       68.0 in Accession #:    [id number]     Weight:       215.0 lb Date of Birth:  13-Oct-1955      BSA:          2.108 m Patient Age:    67 years       BP:           139/84 mmHg Patient Gender: M              HR:           68 bpm. Exam Location:  Inpatient Procedure: 2D Echo, Cardiac Doppler and Color Doppler (Both Spectral and Color            Flow Doppler were utilized during procedure). Indications:    CAD  History:  Patient has no prior history of Echocardiogram examinations.                 CAD; Risk Factors:Hypertension and Former Smoker.  Sonographer:    Juliene Rucks Referring Phys: [id number] BAILEY S SU IMPRESSIONS  1. Left ventricular ejection fraction, by estimation, is 50 to 55%. The left ventricle has low normal function. Left ventricular endocardial border not optimally defined to evaluate regional wall motion. There is mild left ventricular hypertrophy. Left ventricular diastolic parameters were normal.  2. Right ventricular systolic function is normal. The right ventricular size is normal. Tricuspid regurgitation signal is  inadequate for assessing PA pressure.  3. The mitral valve is normal in structure. Trivial mitral valve regurgitation. No evidence of mitral stenosis.  4. The aortic valve was not well visualized. Aortic valve regurgitation is not visualized. No aortic stenosis is present. FINDINGS  Left Ventricle: Left ventricular ejection fraction, by estimation, is 50 to 55%. The left ventricle has low normal function. Left ventricular endocardial border not optimally defined to evaluate regional wall motion. The left ventricular internal cavity  size was normal in size. There is mild left ventricular hypertrophy. Left ventricular diastolic parameters were normal. Right Ventricle: The right ventricular size is normal. No increase in right ventricular wall thickness. Right ventricular systolic function is normal. Tricuspid regurgitation signal is inadequate for assessing PA pressure. Left Atrium: Left atrial size was normal in size. Right Atrium: Right atrial size was normal in size. Pericardium: There is no evidence of pericardial effusion. Mitral Valve: The mitral valve is normal in structure. Trivial mitral valve regurgitation. No evidence of mitral valve stenosis. Tricuspid Valve: The tricuspid valve is normal in structure. Tricuspid valve regurgitation is trivial. Aortic Valve: The aortic valve was not well visualized. Aortic valve regurgitation is not visualized. No aortic stenosis is present. Pulmonic Valve: The pulmonic valve was not well visualized. Pulmonic valve regurgitation is not visualized. Aorta: The aortic root is normal in size and structure. IAS/Shunts: The interatrial septum was not well visualized.  LEFT VENTRICLE PLAX 2D LVIDd:         4.70 cm      Diastology LVIDs:         3.50 cm      LV e' medial:    6.96 cm/s LV PW:         1.10 cm      LV E/e' medial:  11.1 LV IVS:        1.20 cm      LV e' lateral:   10.70 cm/s LVOT diam:     2.20 cm      LV E/e' lateral: 7.2 LV SV:         64 LV SV Index:   30 LVOT  Area:     3.80 cm  LV Volumes (MOD) LV vol d, MOD A2C: 145.0 ml LV vol d, MOD A4C: 143.0 ml LV vol s, MOD A2C: 73.5 ml LV vol s, MOD A4C: 70.5 ml LV SV MOD A2C:     71.5 ml LV SV MOD A4C:     143.0 ml LV SV MOD BP:      71.7 ml RIGHT VENTRICLE RV Basal diam:  2.90 cm RV Mid diam:    2.60 cm LEFT ATRIUM             Index        RIGHT ATRIUM           Index LA diam:  3.90 cm 1.85 cm/m   RA Area:     13.20 cm LA Vol (A2C):   47.2 ml 22.40 ml/m  RA Volume:   28.50 ml  13.52 ml/m LA Vol (A4C):   47.2 ml 22.40 ml/m LA Biplane Vol: 48.6 ml 23.06 ml/m  AORTIC VALVE LVOT Vmax:   79.90 cm/s LVOT Vmean:  53.400 cm/s LVOT VTI:    0.168 m  AORTA Ao Root diam: 3.30 cm MITRAL VALVE MV Area (PHT): 3.60 cm    SHUNTS MV Decel Time: 211 msec    Systemic VTI:  0.17 m MV E velocity: 77.40 cm/s  Systemic Diam: 2.20 cm MV A velocity: 92.10 cm/s MV E/A ratio:  0.84 Lonni Nanas MD Electronically signed by Lonni Nanas MD Signature Date/Time: 11/15/2023/6:05:14 PM    Final    VAS US  DOPPLER PRE CABG Result Date: 11/15/2023 PREOPERATIVE VASCULAR EVALUATION Patient Name:  ALOK MINSHALL  Date of Exam:   11/15/2023 Medical Rec #: [id number]       Accession #:    [id number] Date of Birth: 1955/12/11       Patient Gender: M Patient Age:   56 years Exam Location:  [facility] Procedure:      VAS US  DOPPLER PRE CABG Referring Phys: BAILEY SU --------------------------------------------------------------------------------  Indications:      Pre-CABG. Risk Factors:     Hypertension, hyperlipidemia. Limitations:      Right TR band Comparison Study: No prior studies. Performing Technologist: Gerome Ny RVT  Examination Guidelines: A complete evaluation includes B-mode imaging, spectral Doppler, color Doppler, and power Doppler as needed of all accessible portions of each vessel. Bilateral testing is considered an integral part of a complete examination. Limited examinations for reoccurring indications may be  performed as noted.  Right Carotid Findings: +----------+--------+--------+--------+-----------------------+--------+           PSV cm/sEDV cm/sStenosisDescribe               Comments +----------+--------+--------+--------+-----------------------+--------+ CCA Prox  66      9               smooth and heterogenoustortuous +----------+--------+--------+--------+-----------------------+--------+ CCA Distal60      16              smooth and heterogenous         +----------+--------+--------+--------+-----------------------+--------+ ICA Prox  56      17              smooth and heterogenous         +----------+--------+--------+--------+-----------------------+--------+ ICA Mid   68      31                                     tortuous +----------+--------+--------+--------+-----------------------+--------+ ICA Distal67      31                                     tortuous +----------+--------+--------+--------+-----------------------+--------+ ECA       86      13                                              +----------+--------+--------+--------+-----------------------+--------+ +----------+--------+-------+--------+------------+  PSV cm/sEDV cmsDescribeArm Pressure +----------+--------+-------+--------+------------+ Subclavian142                                 +----------+--------+-------+--------+------------+ +---------+--------+--+--------+--+---------+ VertebralPSV cm/s39EDV cm/s12Antegrade +---------+--------+--+--------+--+---------+ Left Carotid Findings: +----------+--------+--------+--------+-----------------------+--------+           PSV cm/sEDV cm/sStenosisDescribe               Comments +----------+--------+--------+--------+-----------------------+--------+ CCA Prox  120     21              smooth and heterogenous         +----------+--------+--------+--------+-----------------------+--------+ CCA Distal73      25               smooth and heterogenous         +----------+--------+--------+--------+-----------------------+--------+ ICA Prox  90      31              smooth and heterogenous         +----------+--------+--------+--------+-----------------------+--------+ ICA Mid   70      26                                              +----------+--------+--------+--------+-----------------------+--------+ ICA Distal83      37                                     tortuous +----------+--------+--------+--------+-----------------------+--------+ ECA       90      15                                              +----------+--------+--------+--------+-----------------------+--------+ +----------+--------+--------+--------+------------+ SubclavianPSV cm/sEDV cm/sDescribeArm Pressure +----------+--------+--------+--------+------------+           154                     177          +----------+--------+--------+--------+------------+ +---------+--------+--+--------+--+---------+ VertebralPSV cm/s63EDV cm/s20Antegrade +---------+--------+--+--------+--+---------+  ABI Findings: +------------------+-----+-----------+--------+ Rt Pressure (mmHg)IndexWaveform   Comment  +------------------+-----+-----------+--------+                                   TR band  +------------------+-----+-----------+--------+ 174               0.98 multiphasic         +------------------+-----+-----------+--------+ 162               0.92 multiphasic         +------------------+-----+-----------+--------+ +------------------+-----+-----------+ Lt Pressure (mmHg)IndexWaveform    +------------------+-----+-----------+ 177                    triphasic   +------------------+-----+-----------+ 172               0.97 multiphasic +------------------+-----+-----------+ 151               0.85 multiphasic +------------------+-----+-----------+ +-------+---------------+ ABI/TBIToday's  ABI/TBI +-------+---------------+ Right  0.98            +-------+---------------+ Left   0.97            +-------+---------------+  Right Doppler Findings: +--------+--------+ Site    Comments +--------+--------+ BrachialTR band  +--------+--------+ Radial  TR band  +--------+--------+ Ulnar   TR band  +--------+--------+  Left Doppler Findings: +--------+--------+---------+ Site    PressureDoppler   +--------+--------+---------+ [id number]     triphasic +--------+--------+---------+ Radial          triphasic +--------+--------+---------+ Ulnar           triphasic +--------+--------+---------+   Summary: Right Carotid: Velocities in the right ICA are consistent with a 1-39% stenosis. Left Carotid: Velocities in the left ICA are consistent with a 1-39% stenosis. Vertebrals: Bilateral vertebral arteries demonstrate antegrade flow. Right ABI: Resting right ankle-brachial index is within normal range. Left ABI: Resting left ankle-brachial index is within normal range. Left Upper Extremity: Doppler waveforms remain within normal limits with left radial compression. Doppler waveforms remain within normal limits with left ulnar compression.  Electronically signed by Gaile New MD on 11/15/2023 at 4:56:37 PM.    Final    CARDIAC CATHETERIZATION Result Date: 11/15/2023 Images from the original result were not included. Cardiac Catheterization 11/15/23: Hemodynamic data: LV: 161/3, EDP 23 mmHg.  Ao 156/90, mean 121 mmHg.  No pressure gradient across the aortic valve. Angiographic data: LM: Distal left main has a calcific and complex irregular 80% stenosis. LAD: LAD is calcified in the proximal segment.  There is a eccentric 60% stenosis in the proximal LAD.  Large D2.  Mild disease in the mid and distal LAD. LCx: Very large caliber vessel giving origin to a large OM1.  Proximal Cx has a 90% calcific stenosis, large OM1 has a proximal 90% stenosis followed by distal 60% stenosis. RI:  Moderate caliber vessel, supplies large area.  Ostium has ectasia. RCA: Has anterior origin.  There is tandem 40% proximal and a mid 60% stenosis.  PDA PL branches are very small and diffusely diseased and PL has a focal ostial 80% stenosis. 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  Hence discharge made to admit the patient for inpatient artery bypass grafting.  Patient will be started on IV heparin  as well.    Recent Results (from the past 2160 hours)  CBC     Status: Abnormal   Collection Time: 11/10/23 10:41 AM  Result Value Ref Range   WBC 5.8 3.4 - 10.8 x10E3/uL   RBC 5.17 4.14 - 5.80 x10E6/uL   Hemoglobin 15.7 13.0 - 17.7 g/dL   Hematocrit 53.3 62.4 - 51.0 %   MCV 90 79 - 97 fL   MCH 30.4 26.6 - 33.0 pg   MCHC 33.7 31.5 - 35.7 g/dL   RDW 86.7 88.3 - 84.5 %   Platelets 137 (L) 150 - 450 x10E3/uL  Basic Metabolic Panel (BMET)     Status: None   Collection Time: 11/10/23 10:41 AM  Result Value Ref Range   Glucose 96 70 - 99 mg/dL   BUN 16 8 - 27 mg/dL   Creatinine, Ser 8.90 0.76 - 1.27 mg/dL   eGFR 74 >40 fO/fpw/8.26   BUN/Creatinine Ratio 15 10 - 24   Sodium 140 134 - 144 mmol/L   Potassium 4.7 3.5 - 5.2 mmol/L   Chloride 102 96 - 106 mmol/L   CO2 23 20 - 29 mmol/L   Calcium  9.0 8.6 - 10.2 mg/dL  VAS US  DOPPLER PRE CABG     Status: None   Collection Time: 11/15/23  3:32  PM  Result Value Ref Range   Right ABI 0.98    Left ABI 0.97   Surgical PCR screen     Status: None   Collection Time: 11/15/23  4:16 PM   Specimen: Nasal Mucosa; Nasal Swab  Result Value Ref Range   MRSA, PCR NEGATIVE NEGATIVE   Staphylococcus aureus NEGATIVE NEGATIVE    Comment: (NOTE) The Xpert SA Assay (FDA approved for NASAL specimens in patients 2 years of age and older), is one component of a comprehensive surveillance program. It is not intended to  diagnose infection nor to guide or monitor treatment. Performed at [facility], [address], [place], [place] [address]   ECHOCARDIOGRAM COMPLETE     Status: None   Collection Time: 11/15/23  4:38 PM  Result Value Ref Range   Weight 3,440 oz   Height 68 in   BP 139/84 mmHg   Single Plane A2C EF 49.3 %   Single Plane A4C EF 50.7 %   Calc EF 49.7 %   S' Lateral 3.50 cm   Area-P 1/2 3.60 cm2   Est EF 50 - 55%   Type and screen [facility]     Status: None   Collection Time: 11/15/23  7:01 PM  Result Value Ref Range   ABO/RH(D) A NEG    Antibody Screen NEG    Sample Expiration 11/18/2023,2359    Unit Number [id number]    Blood Component Type RED CELLS,LR    Unit division 00    Status of Unit REL FROM [facility]    Transfusion Status OK TO TRANSFUSE    Crossmatch Result      Compatible Performed at [facility], [address] [place], [place] [address]    Unit Number [id number]    Blood Component Type RED CELLS,LR    Unit division 00    Status of Unit REL FROM [facility]    Transfusion Status OK TO TRANSFUSE    Crossmatch Result Compatible   BPAM RBC     Status: None   Collection Time: 11/15/23  7:01 PM  Result Value Ref Range   Blood Product Unit Number [id number]    PRODUCT CODE E0382V00    Unit Type and Rh 0600    Blood Product Expiration Date 797490787640    Blood Product Unit Number [id number]    PRODUCT CODE [id number]    Unit Type and Rh 0600    Blood Product Expiration Date 797490787640   Blood gas, arterial     Status: None   Collection Time: 11/16/23  5:40 AM  Result Value Ref Range   pH, Arterial 7.44 7.35 - 7.45   pCO2 arterial 37 32 - 48 mmHg   pO2, Arterial 88 83 - 108 mmHg   Bicarbonate 25.1 20.0 - 28.0 mmol/L   Acid-Base Excess 1.2 0.0 - 2.0 mmol/L   O2 Saturation 98.2 %   Patient temperature 36.9    Collection site LEFT RADIAL    Drawn by COLLECTED BY RT    Allens test (pass/fail) PASS PASS    Comment:  Performed at [facility], [address], [place], [place] [address]  ABO/Rh     Status: None   Collection Time: 11/16/23  6:02 AM  Result Value Ref Range   ABO/RH(D)      A NEG Performed at [facility], [address], [place], [place] [address]   Prepare RBC (crossmatch)     Status:  None   Collection Time: 11/16/23  6:02 AM  Result Value Ref Range   Order Confirmation      ORDER PROCESSED BY BLOOD BANK Performed at [facility], [address], [place], [place] [address]   Basic metabolic panel     Status: Abnormal   Collection Time: 11/16/23  6:04 AM  Result Value Ref Range   Sodium 138 135 - 145 mmol/L   Potassium 4.4 3.5 - 5.1 mmol/L   Chloride 103 98 - 111 mmol/L   CO2 24 22 - 32 mmol/L   Glucose, Bld 107 (H) 70 - 99 mg/dL    Comment: Glucose reference range applies only to samples taken after fasting for at least 8 hours.   BUN 13 8 - 23 mg/dL   Creatinine, Ser 8.98 0.61 - 1.24 mg/dL   Calcium  9.1 8.9 - 10.3 mg/dL   GFR, Estimated >39 >39 mL/min    Comment: (NOTE) Calculated using the CKD-EPI Creatinine Equation (2021)    Anion gap 11 5 - 15    Comment: Performed at [facility], [address] [place], [place] [address]  CBC     Status: Abnormal   Collection Time: 11/16/23  6:04 AM  Result Value Ref Range   WBC 5.8 4.0 - 10.5 K/uL   RBC 5.19 4.22 - 5.81 MIL/uL   Hemoglobin 15.7 13.0 - 17.0 g/dL   HCT 55.6 60.9 - 47.9 %   MCV 85.4 80.0 - 100.0 fL   MCH 30.3 26.0 - 34.0 pg   MCHC 35.4 30.0 - 36.0 g/dL   RDW 87.0 88.4 - 84.4 %   Platelets 123 (L) 150 - 400 K/uL   nRBC 0.0 0.0 - 0.2 %    Comment: Performed at [facility], [address], [place], [place] [address]  Hemoglobin A1c     Status: None   Collection Time: 11/16/23  6:04 AM  Result Value Ref Range   Hgb A1c MFr Bld 5.2 4.8 - 5.6 %    Comment: (NOTE) Diagnosis of Diabetes The following HbA1c ranges recommended by the American Diabetes Association (ADA) may be used as  an aid in the diagnosis of diabetes mellitus.  Hemoglobin             Suggested A1C NGSP%              Diagnosis  <5.7                   Non Diabetic  5.7-6.4                Pre-Diabetic  >6.4                   Diabetic  <7.0                   Glycemic control for                       adults with diabetes.     Mean Plasma Glucose 102.54 mg/dL    Comment: Performed at [facility], [address], [place], [place] [address]  Prepare RBC (crossmatch)     Status: None   Collection Time: 11/16/23  6:24 AM  Result Value Ref Range   Order Confirmation      ORDER PROCESSED BY BLOOD BANK Performed at [facility], [address] [place], [place] [address]   DOZIER brew  8     Status: Abnormal   Collection Time: 11/16/23  7:56 AM  Result Value Ref Range   Sodium 138 135 - 145 mmol/L   Potassium 4.4 3.5 - 5.1 mmol/L   Chloride 102 98 - 111 mmol/L   BUN 12 8 - 23 mg/dL   Creatinine, Ser 8.99 0.61 - 1.24 mg/dL   Glucose, Bld 884 (H) 70 - 99 mg/dL    Comment: Glucose reference range applies only to samples taken after fasting for at least 8 hours.   Calcium , Ion 1.23 1.15 - 1.40 mmol/L   TCO2 26 22 - 32 mmol/L   Hemoglobin 14.3 13.0 - 17.0 g/dL   HCT 57.9 60.9 - 47.9 %  ECHO INTRAOPERATIVE TEE     Status: None   Collection Time: 11/16/23  8:30 AM  Result Value Ref Range   Weight 3,440 oz   Height 68 in   BP 166/87 mmHg  I-STAT, chem 8     Status: Abnormal   Collection Time: 11/16/23  9:41 AM  Result Value Ref Range   Sodium 139 135 - 145 mmol/L   Potassium 4.1 3.5 - 5.1 mmol/L   Chloride 107 98 - 111 mmol/L   BUN 11 8 - 23 mg/dL   Creatinine, Ser 9.19 0.61 - 1.24 mg/dL   Glucose, Bld 899 (H) 70 - 99 mg/dL    Comment: Glucose reference range applies only to samples taken after fasting for at least 8 hours.   Calcium , Ion 1.08 (L) 1.15 - 1.40 mmol/L   TCO2 23 22 - 32 mmol/L   Hemoglobin 11.2 (L) 13.0 - 17.0 g/dL   HCT 66.9 (L) 60.9 - 47.9 %  I-STAT 7, (LYTES,  BLD GAS, ICA, H+H)     Status: Abnormal   Collection Time: 11/16/23 10:25 AM  Result Value Ref Range   pH, Arterial 7.342 (L) 7.35 - 7.45   pCO2 arterial 45.5 32 - 48 mmHg   pO2, Arterial 379 (H) 83 - 108 mmHg   Bicarbonate 24.7 20.0 - 28.0 mmol/L   TCO2 26 22 - 32 mmol/L   O2 Saturation 100 %   Acid-base deficit 1.0 0.0 - 2.0 mmol/L   Sodium 136 135 - 145 mmol/L   Potassium 5.6 (H) 3.5 - 5.1 mmol/L   Calcium , Ion 0.98 (L) 1.15 - 1.40 mmol/L   HCT 30.0 (L) 39.0 - 52.0 %   Hemoglobin 10.2 (L) 13.0 - 17.0 g/dL   Sample type ARTERIAL   POCT I-Stat EG7     Status: Abnormal   Collection Time: 11/16/23 10:30 AM  Result Value Ref Range   pH, Ven 7.329 7.25 - 7.43   pCO2, Ven 50.0 44 - 60 mmHg   pO2, Ven 49 (H) 32 - 45 mmHg   Bicarbonate 26.3 20.0 - 28.0 mmol/L   TCO2 28 22 - 32 mmol/L   O2 Saturation 81 %   Acid-Base Excess 0.0 0.0 - 2.0 mmol/L   Sodium 134 (L) 135 - 145 mmol/L   Potassium 5.5 (H) 3.5 - 5.1 mmol/L   Calcium , Ion 1.03 (L) 1.15 - 1.40 mmol/L   HCT 30.0 (L) 39.0 - 52.0 %   Hemoglobin 10.2 (L) 13.0 - 17.0 g/dL   Sample type VENOUS   I-STAT, chem 8     Status: Abnormal   Collection Time: 11/16/23 10:52 AM  Result Value Ref Range   Sodium 131 (L) 135 - 145 mmol/L   Potassium 6.4 (HH) 3.5 - 5.1 mmol/L  Chloride 99 98 - 111 mmol/L   BUN 13 8 - 23 mg/dL   Creatinine, Ser 8.99 0.61 - 1.24 mg/dL   Glucose, Bld 872 (H) 70 - 99 mg/dL    Comment: Glucose reference range applies only to samples taken after fasting for at least 8 hours.   Calcium , Ion 1.04 (L) 1.15 - 1.40 mmol/L   TCO2 25 22 - 32 mmol/L   Hemoglobin 9.9 (L) 13.0 - 17.0 g/dL   HCT 70.9 (L) 60.9 - 47.9 %  Platelet count     Status: Abnormal   Collection Time: 11/16/23 11:33 AM  Result Value Ref Range   Platelets 130 (L) 150 - 400 K/uL    Comment: Performed at [facility], [address], [place], [place] [address]  Hemoglobin and hematocrit, blood     Status: Abnormal   Collection Time: 11/16/23  11:33 AM  Result Value Ref Range   Hemoglobin 10.3 (L) 13.0 - 17.0 g/dL    Comment: REPEATED TO VERIFY READ BACK AND VERIFIED WITH CALEB EVERHART,RN AT 1148 11/16/23 BY ZBEECH.    HCT 30.0 (L) 39.0 - 52.0 %    Comment: Performed at [facility], [address], [place], [place] [address]  I-STAT, nathanael 8     Status: Abnormal   Collection Time: 11/16/23 11:49 AM  Result Value Ref Range   Sodium 132 (L) 135 - 145 mmol/L   Potassium 6.4 (HH) 3.5 - 5.1 mmol/L   Chloride 99 98 - 111 mmol/L   BUN 14 8 - 23 mg/dL   Creatinine, Ser 8.99 0.61 - 1.24 mg/dL   Glucose, Bld 858 (H) 70 - 99 mg/dL    Comment: Glucose reference range applies only to samples taken after fasting for at least 8 hours.   Calcium , Ion 1.14 (L) 1.15 - 1.40 mmol/L   TCO2 26 22 - 32 mmol/L   Hemoglobin 9.5 (L) 13.0 - 17.0 g/dL   HCT 71.9 (L) 60.9 - 47.9 %  I-STAT, chem 8     Status: Abnormal   Collection Time: 11/16/23 12:06 PM  Result Value Ref Range   Sodium 135 135 - 145 mmol/L   Potassium 6.1 (H) 3.5 - 5.1 mmol/L   Chloride 100 98 - 111 mmol/L   BUN 14 8 - 23 mg/dL   Creatinine, Ser 9.09 0.61 - 1.24 mg/dL   Glucose, Bld 859 (H) 70 - 99 mg/dL    Comment: Glucose reference range applies only to samples taken after fasting for at least 8 hours.   Calcium , Ion 1.06 (L) 1.15 - 1.40 mmol/L   TCO2 27 22 - 32 mmol/L   Hemoglobin 8.8 (L) 13.0 - 17.0 g/dL   HCT 73.9 (L) 60.9 - 47.9 %  I-STAT 7, (LYTES, BLD GAS, ICA, H+H)     Status: Abnormal   Collection Time: 11/16/23  1:15 PM  Result Value Ref Range   pH, Arterial 7.294 (L) 7.35 - 7.45   pCO2 arterial 51.9 (H) 32 - 48 mmHg   pO2, Arterial 315 (H) 83 - 108 mmHg   Bicarbonate 25.2 20.0 - 28.0 mmol/L   TCO2 27 22 - 32 mmol/L   O2 Saturation 100 %   Acid-base deficit 1.0 0.0 - 2.0 mmol/L   Sodium 136 135 - 145 mmol/L   Potassium 5.3 (H) 3.5 - 5.1 mmol/L   Calcium , Ion 1.09 (L) 1.15 - 1.40 mmol/L   HCT 26.0 (L) 39.0 - 52.0 %   Hemoglobin 8.8 (  L) 13.0 - 17.0  g/dL   Sample type ARTERIAL   I-STAT, chem 8     Status: Abnormal   Collection Time: 11/16/23  1:18 PM  Result Value Ref Range   Sodium 136 135 - 145 mmol/L   Potassium 5.3 (H) 3.5 - 5.1 mmol/L   Chloride 102 98 - 111 mmol/L   BUN 14 8 - 23 mg/dL   Creatinine, Ser 8.99 0.61 - 1.24 mg/dL   Glucose, Bld 839 (H) 70 - 99 mg/dL    Comment: Glucose reference range applies only to samples taken after fasting for at least 8 hours.   Calcium , Ion 1.09 (L) 1.15 - 1.40 mmol/L   TCO2 25 22 - 32 mmol/L   Hemoglobin 9.5 (L) 13.0 - 17.0 g/dL   HCT 71.9 (L) 60.9 - 47.9 %  Fibrinogen      Status: Abnormal   Collection Time: 11/16/23  1:44 PM  Result Value Ref Range   Fibrinogen  179 (L) 210 - 475 mg/dL    Comment: RESULT CALLED TO, READ BACK BY AND VERIFIED WITH: LOIS PRESCOTT, RN AT 1358 09.04.25 D. BLU (NOTE) Fibrinogen  results may be underestimated in patients receiving thrombolytic therapy. Performed at [facility], [address], [place], [place] [address]   Prepare platelet pheresis     Status: None   Collection Time: 11/16/23  2:00 PM  Result Value Ref Range   Unit Number [id number]    Blood Component Type PLTP1 PSORALEN TREATED    Unit division 00    Status of Unit ISSUED,FINAL    Transfusion Status      OK TO TRANSFUSE Performed at [facility], [address], [place], [place] [address]   BPAM Platelet Pheresis     Status: None   Collection Time: 11/16/23  2:00 PM  Result Value Ref Range   ISSUE DATE / TIME [id number]    Blood Product Unit Number [id number]    PRODUCT CODE [id number]    Unit Type and Rh 0600    Blood Product Expiration Date [id number]   Glucose, capillary     Status: Abnormal   Collection Time: 11/16/23  2:42 PM  Result Value Ref Range   Glucose-Capillary 126 (H) 70 - 99 mg/dL    Comment: Glucose reference range applies only to samples taken after fasting for at least 8 hours.  CBC     Status: Abnormal   Collection Time: 11/16/23  2:43  PM  Result Value Ref Range   WBC 7.7 4.0 - 10.5 K/uL   RBC 3.99 (L) 4.22 - 5.81 MIL/uL   Hemoglobin 12.0 (L) 13.0 - 17.0 g/dL   HCT 65.3 (L) 60.9 - 47.9 %   MCV 86.7 80.0 - 100.0 fL   MCH 30.1 26.0 - 34.0 pg   MCHC 34.7 30.0 - 36.0 g/dL   RDW 86.9 88.4 - 84.4 %   Platelets 88 (L) 150 - 400 K/uL    Comment: PLATELET COUNT CONFIRMED BY SMEAR REPEATED TO VERIFY Immature Platelet Fraction may be clinically indicated, consider ordering this additional test [id number]    nRBC 0.0 0.0 - 0.2 %    Comment: Performed at [facility], [address] [place], [place] [address]  Protime-INR     Status: Abnormal   Collection Time: 11/16/23  2:43 PM  Result Value Ref Range   Prothrombin Time 18.2 (H) 11.4 - 15.2 seconds   INR 1.4 (H) 0.8 - 1.2    Comment: (NOTE) INR  goal varies based on device and disease states. Performed at [facility], [address] [place], [place] [address]   APTT     Status: None   Collection Time: 11/16/23  2:43 PM  Result Value Ref Range   aPTT 34 24 - 36 seconds    Comment: Performed at [facility], [address], [place], [place] [address]  I-STAT 7, (LYTES, BLD GAS, ICA, H+H)     Status: Abnormal   Collection Time: 11/16/23  2:45 PM  Result Value Ref Range   pH, Arterial 7.315 (L) 7.35 - 7.45   pCO2 arterial 46.8 32 - 48 mmHg   pO2, Arterial 102 83 - 108 mmHg   Bicarbonate 24.1 20.0 - 28.0 mmol/L   TCO2 26 22 - 32 mmol/L   O2 Saturation 97 %   Acid-base deficit 2.0 0.0 - 2.0 mmol/L   Sodium 139 135 - 145 mmol/L   Potassium 5.1 3.5 - 5.1 mmol/L   Calcium , Ion 1.11 (L) 1.15 - 1.40 mmol/L   HCT 33.0 (L) 39.0 - 52.0 %   Hemoglobin 11.2 (L) 13.0 - 17.0 g/dL   Patient temperature 63.7 C    Sample type ARTERIAL   Prepare platelet pheresis     Status: None   Collection Time: 11/16/23  3:01 PM  Result Value Ref Range   Unit Number [id number]    Blood Component Type PLTP2 PSORALEN TREATED    Unit division 00    Status of Unit REL  FROM [facility]    Transfusion Status OK TO TRANSFUSE   BPAM Platelet Pheresis     Status: None   Collection Time: 11/16/23  3:01 PM  Result Value Ref Range   Blood Product Unit Number [id number]    PRODUCT CODE [id number]    Unit Type and Rh 0600    Blood Product Expiration Date [id number]   Glucose, capillary     Status: Abnormal   Collection Time: 11/16/23  4:10 PM  Result Value Ref Range   Glucose-Capillary 128 (H) 70 - 99 mg/dL    Comment: Glucose reference range applies only to samples taken after fasting for at least 8 hours.  Glucose, capillary     Status: Abnormal   Collection Time: 11/16/23  5:09 PM  Result Value Ref Range   Glucose-Capillary 140 (H) 70 - 99 mg/dL    Comment: Glucose reference range applies only to samples taken after fasting for at least 8 hours.  I-STAT 7, (LYTES, BLD GAS, ICA, H+H)     Status: Abnormal   Collection Time: 11/16/23  6:36 PM  Result Value Ref Range   pH, Arterial 7.359 7.35 - 7.45   pCO2 arterial 45.8 32 - 48 mmHg   pO2, Arterial 77 (L) 83 - 108 mmHg   Bicarbonate 25.8 20.0 - 28.0 mmol/L   TCO2 27 22 - 32 mmol/L   O2 Saturation 95 %   Acid-Base Excess 0.0 0.0 - 2.0 mmol/L   Sodium 140 135 - 145 mmol/L   Potassium 5.0 3.5 - 5.1 mmol/L   Calcium , Ion 1.10 (L) 1.15 - 1.40 mmol/L   HCT 29.0 (L) 39.0 - 52.0 %   Hemoglobin 9.9 (L) 13.0 - 17.0 g/dL   Patient temperature 63.1 C    Sample type ARTERIAL   Glucose, capillary     Status: Abnormal   Collection Time: 11/16/23  6:37 PM  Result Value Ref Range   Glucose-Capillary 157 (H) 70 - 99 mg/dL  Comment: Glucose reference range applies only to samples taken after fasting for at least 8 hours.  Glucose, capillary     Status: Abnormal   Collection Time: 11/16/23  8:20 PM  Result Value Ref Range   Glucose-Capillary 153 (H) 70 - 99 mg/dL    Comment: Glucose reference range applies only to samples taken after fasting for at least 8 hours.  I-STAT 7, (LYTES, BLD GAS, ICA, H+H)     Status:  Abnormal   Collection Time: 11/16/23  8:22 PM  Result Value Ref Range   pH, Arterial 7.360 7.35 - 7.45   pCO2 arterial 44.5 32 - 48 mmHg   pO2, Arterial 85 83 - 108 mmHg   Bicarbonate 25.3 20.0 - 28.0 mmol/L   TCO2 27 22 - 32 mmol/L   O2 Saturation 96 %   Acid-Base Excess 0.0 0.0 - 2.0 mmol/L   Sodium 140 135 - 145 mmol/L   Potassium 4.8 3.5 - 5.1 mmol/L   Calcium , Ion 1.10 (L) 1.15 - 1.40 mmol/L   HCT 29.0 (L) 39.0 - 52.0 %   Hemoglobin 9.9 (L) 13.0 - 17.0 g/dL   Patient temperature 63.4 C    Sample type ARTERIAL   Glucose, capillary     Status: Abnormal   Collection Time: 11/16/23  9:01 PM  Result Value Ref Range   Glucose-Capillary 146 (H) 70 - 99 mg/dL    Comment: Glucose reference range applies only to samples taken after fasting for at least 8 hours.  Basic metabolic panel     Status: Abnormal   Collection Time: 11/16/23  9:03 PM  Result Value Ref Range   Sodium 140 135 - 145 mmol/L   Potassium 4.5 3.5 - 5.1 mmol/L   Chloride 106 98 - 111 mmol/L   CO2 24 22 - 32 mmol/L   Glucose, Bld 146 (H) 70 - 99 mg/dL    Comment: Glucose reference range applies only to samples taken after fasting for at least 8 hours.   BUN 14 8 - 23 mg/dL   Creatinine, Ser 8.84 0.61 - 1.24 mg/dL   Calcium  7.4 (L) 8.9 - 10.3 mg/dL   GFR, Estimated >39 >39 mL/min    Comment: (NOTE) Calculated using the CKD-EPI Creatinine Equation (2021)    Anion gap 10 5 - 15    Comment: Performed at [facility], [address], [place], [place] [address]  Magnesium      Status: Abnormal   Collection Time: 11/16/23  9:03 PM  Result Value Ref Range   Magnesium  3.2 (H) 1.7 - 2.4 mg/dL    Comment: Performed at [facility], [address] [place], [place] [address]  CBC     Status: Abnormal   Collection Time: 11/16/23  9:03 PM  Result Value Ref Range   WBC 7.8 4.0 - 10.5 K/uL   RBC 3.73 (L) 4.22 - 5.81 MIL/uL   Hemoglobin 11.3 (L) 13.0 - 17.0 g/dL   HCT 67.3 (L) 60.9 - 47.9 %   MCV 87.4 80.0 -  100.0 fL   MCH 30.3 26.0 - 34.0 pg   MCHC 34.7 30.0 - 36.0 g/dL   RDW 86.7 88.4 - 84.4 %   Platelets 111 (L) 150 - 400 K/uL   nRBC 0.0 0.0 - 0.2 %    Comment: Performed at [facility], [address] [place], [place] [address]  Glucose, capillary     Status: Abnormal   Collection Time: 11/16/23  9:57 PM  Result Value Ref Range   Glucose-Capillary 113 (H) 70 - 99 mg/dL    Comment: Glucose reference range applies only to samples taken after fasting for at least 8 hours.  Glucose, capillary     Status: Abnormal   Collection Time: 11/16/23 11:40 PM  Result Value Ref Range   Glucose-Capillary 64 (L) 70 - 99 mg/dL    Comment: Glucose reference range applies only to samples taken after fasting for at least 8 hours.  Glucose, capillary     Status: Abnormal   Collection Time: 11/17/23 12:46 AM  Result Value Ref Range   Glucose-Capillary 197 (H) 70 - 99 mg/dL    Comment: Glucose reference range applies only to samples taken after fasting for at least 8 hours.  Glucose, capillary     Status: Abnormal   Collection Time: 11/17/23  2:19 AM  Result Value Ref Range   Glucose-Capillary 156 (H) 70 - 99 mg/dL    Comment: Glucose reference range applies only to samples taken after fasting for at least 8 hours.  Glucose, capillary     Status: Abnormal   Collection Time: 11/17/23  3:27 AM  Result Value Ref Range   Glucose-Capillary 141 (H) 70 - 99 mg/dL    Comment: Glucose reference range applies only to samples taken after fasting for at least 8 hours.  Glucose, capillary     Status: Abnormal   Collection Time: 11/17/23  5:11 AM  Result Value Ref Range   Glucose-Capillary 147 (H) 70 - 99 mg/dL    Comment: Glucose reference range applies only to samples taken after fasting for at least 8 hours.  CBC     Status: Abnormal   Collection Time: 11/17/23  5:13 AM  Result Value Ref Range   WBC 7.5 4.0 - 10.5 K/uL   RBC 3.40 (L) 4.22 - 5.81 MIL/uL   Hemoglobin 10.3 (L) 13.0 - 17.0 g/dL   HCT 70.0  (L) 60.9 - 52.0 %   MCV 87.9 80.0 - 100.0 fL   MCH 30.3 26.0 - 34.0 pg   MCHC 34.4 30.0 - 36.0 g/dL   RDW 86.5 88.4 - 84.4 %   Platelets 106 (L) 150 - 400 K/uL   nRBC 0.0 0.0 - 0.2 %    Comment: Performed at [facility], [address] [place], [place] [address]  Basic metabolic panel     Status: Abnormal   Collection Time: 11/17/23  5:13 AM  Result Value Ref Range   Sodium 138 135 - 145 mmol/L   Potassium 4.5 3.5 - 5.1 mmol/L   Chloride 108 98 - 111 mmol/L   CO2 25 22 - 32 mmol/L   Glucose, Bld 148 (H) 70 - 99 mg/dL    Comment: Glucose reference range applies only to samples taken after fasting for at least 8 hours.   BUN 15 8 - 23 mg/dL   Creatinine, Ser 8.75 0.61 - 1.24 mg/dL   Calcium  7.5 (L) 8.9 - 10.3 mg/dL   GFR, Estimated >39 >39 mL/min    Comment: (NOTE) Calculated using the CKD-EPI Creatinine Equation (2021)    Anion gap 5 5 - 15    Comment: Performed at [facility], [address] [place], [place] [address]  Magnesium      Status: Abnormal   Collection Time: 11/17/23  5:13 AM  Result Value Ref Range   Magnesium  2.9 (H) 1.7 - 2.4 mg/dL    Comment: Performed at [facility], [address], [place], [place] [address]  Glucose, capillary     Status: Abnormal   Collection Time: 11/17/23  6:03 AM  Result Value Ref Range   Glucose-Capillary 133 (H) 70 - 99 mg/dL    Comment: Glucose reference range applies only to samples taken after fasting for at least 8 hours.  Glucose, capillary     Status: Abnormal   Collection Time: 11/17/23  7:05 AM  Result Value Ref Range   Glucose-Capillary 134 (H) 70 - 99 mg/dL    Comment: Glucose reference range applies only to samples taken after fasting for at least 8 hours.  Glucose, capillary     Status: Abnormal   Collection Time: 11/17/23  9:05 AM  Result Value Ref Range   Glucose-Capillary 103 (H) 70 - 99 mg/dL    Comment: Glucose reference range applies only to samples taken after fasting for at least 8 hours.   Glucose, capillary     Status: Abnormal   Collection Time: 11/17/23 11:26 AM  Result Value Ref Range   Glucose-Capillary 128 (H) 70 - 99 mg/dL    Comment: Glucose reference range applies only to samples taken after fasting for at least 8 hours.  Glucose, capillary     Status: None   Collection Time: 11/17/23  4:00 PM  Result Value Ref Range   Glucose-Capillary 91 70 - 99 mg/dL    Comment: Glucose reference range applies only to samples taken after fasting for at least 8 hours.  Basic metabolic panel     Status: Abnormal   Collection Time: 11/17/23  4:17 PM  Result Value Ref Range   Sodium 136 135 - 145 mmol/L   Potassium 4.4 3.5 - 5.1 mmol/L   Chloride 101 98 - 111 mmol/L   CO2 25 22 - 32 mmol/L   Glucose, Bld 121 (H) 70 - 99 mg/dL    Comment: Glucose reference range applies only to samples taken after fasting for at least 8 hours.   BUN 21 8 - 23 mg/dL   Creatinine, Ser 8.48 (H) 0.61 - 1.24 mg/dL   Calcium  7.7 (L) 8.9 - 10.3 mg/dL   GFR, Estimated 50 (L) >60 mL/min    Comment: (NOTE) Calculated using the CKD-EPI Creatinine Equation (2021)    Anion gap 10 5 - 15    Comment: Performed at [facility], [address] [place], [place] [address]  Magnesium      Status: Abnormal   Collection Time: 11/17/23  4:17 PM  Result Value Ref Range   Magnesium  3.0 (H) 1.7 - 2.4 mg/dL    Comment: Performed at [facility], [address], [place], [place] [address]  CBC     Status: Abnormal   Collection Time: 11/17/23  4:17 PM  Result Value Ref Range   WBC 8.5 4.0 - 10.5 K/uL   RBC 3.38 (L) 4.22 - 5.81 MIL/uL   Hemoglobin 10.3 (L) 13.0 - 17.0 g/dL   HCT 69.3 (L) 60.9 - 47.9 %   MCV 90.5 80.0 - 100.0 fL   MCH 30.5 26.0 - 34.0 pg   MCHC 33.7 30.0 - 36.0 g/dL   RDW 86.1 88.4 - 84.4 %   Platelets 104 (L) 150 - 400 K/uL   nRBC 0.0 0.0 - 0.2 %    Comment: Performed at [facility], [address] [place], [place] [address]  Glucose, capillary     Status: Abnormal    Collection Time: 11/17/23  8:17 PM  Result  Value Ref Range   Glucose-Capillary 129 (H) 70 - 99 mg/dL    Comment: Glucose reference range applies only to samples taken after fasting for at least 8 hours.  Glucose, capillary     Status: Abnormal   Collection Time: 11/17/23 11:25 PM  Result Value Ref Range   Glucose-Capillary 152 (H) 70 - 99 mg/dL    Comment: Glucose reference range applies only to samples taken after fasting for at least 8 hours.  Glucose, capillary     Status: Abnormal   Collection Time: 11/18/23  3:28 AM  Result Value Ref Range   Glucose-Capillary 113 (H) 70 - 99 mg/dL    Comment: Glucose reference range applies only to samples taken after fasting for at least 8 hours.  Basic metabolic panel     Status: Abnormal   Collection Time: 11/18/23  4:00 AM  Result Value Ref Range   Sodium 138 135 - 145 mmol/L   Potassium 4.7 3.5 - 5.1 mmol/L   Chloride 102 98 - 111 mmol/L   CO2 25 22 - 32 mmol/L   Glucose, Bld 120 (H) 70 - 99 mg/dL    Comment: Glucose reference range applies only to samples taken after fasting for at least 8 hours.   BUN 26 (H) 8 - 23 mg/dL   Creatinine, Ser 8.50 (H) 0.61 - 1.24 mg/dL   Calcium  7.6 (L) 8.9 - 10.3 mg/dL   GFR, Estimated 51 (L) >60 mL/min    Comment: (NOTE) Calculated using the CKD-EPI Creatinine Equation (2021)    Anion gap 11 5 - 15    Comment: Performed at [facility], [address] [place], [place] [address]  CBC     Status: Abnormal   Collection Time: 11/18/23  4:00 AM  Result Value Ref Range   WBC 8.7 4.0 - 10.5 K/uL   RBC 3.17 (L) 4.22 - 5.81 MIL/uL   Hemoglobin 9.5 (L) 13.0 - 17.0 g/dL   HCT 71.2 (L) 60.9 - 47.9 %   MCV 90.5 80.0 - 100.0 fL   MCH 30.0 26.0 - 34.0 pg   MCHC 33.1 30.0 - 36.0 g/dL   RDW 86.2 88.4 - 84.4 %   Platelets 107 (L) 150 - 400 K/uL   nRBC 0.0 0.0 - 0.2 %    Comment: Performed at [facility], [address], [place], [place] [address]  Glucose, capillary     Status: Abnormal    Collection Time: 11/18/23  8:30 AM  Result Value Ref Range   Glucose-Capillary 119 (H) 70 - 99 mg/dL    Comment: Glucose reference range applies only to samples taken after fasting for at least 8 hours.  Glucose, capillary     Status: Abnormal   Collection Time: 11/18/23 12:10 PM  Result Value Ref Range   Glucose-Capillary 112 (H) 70 - 99 mg/dL    Comment: Glucose reference range applies only to samples taken after fasting for at least 8 hours.  Glucose, capillary     Status: Abnormal   Collection Time: 11/18/23  4:28 PM  Result Value Ref Range   Glucose-Capillary 113 (H) 70 - 99 mg/dL    Comment: Glucose reference range applies only to samples taken after fasting for at least 8 hours.  Glucose, capillary     Status: Abnormal   Collection Time: 11/18/23  9:13 PM  Result Value Ref Range   Glucose-Capillary 102 (H) 70 - 99 mg/dL    Comment: Glucose reference range applies only  to samples taken after fasting for at least 8 hours.  Glucose, capillary     Status: Abnormal   Collection Time: 11/19/23  6:34 AM  Result Value Ref Range   Glucose-Capillary 114 (H) 70 - 99 mg/dL    Comment: Glucose reference range applies only to samples taken after fasting for at least 8 hours.  Basic metabolic panel     Status: Abnormal   Collection Time: 11/19/23  7:42 AM  Result Value Ref Range   Sodium 135 135 - 145 mmol/L   Potassium 4.3 3.5 - 5.1 mmol/L   Chloride 101 98 - 111 mmol/L   CO2 25 22 - 32 mmol/L   Glucose, Bld 149 (H) 70 - 99 mg/dL    Comment: Glucose reference range applies only to samples taken after fasting for at least 8 hours.   BUN 27 (H) 8 - 23 mg/dL   Creatinine, Ser 8.91 0.61 - 1.24 mg/dL   Calcium  7.8 (L) 8.9 - 10.3 mg/dL   GFR, Estimated >39 >39 mL/min    Comment: (NOTE) Calculated using the CKD-EPI Creatinine Equation (2021)    Anion gap 9 5 - 15    Comment: Performed at [facility], [address], [place], [place] [address]  CBC     Status: Abnormal   Collection  Time: 11/19/23  7:42 AM  Result Value Ref Range   WBC 6.1 4.0 - 10.5 K/uL   RBC 3.00 (L) 4.22 - 5.81 MIL/uL   Hemoglobin 9.1 (L) 13.0 - 17.0 g/dL   HCT 73.3 (L) 60.9 - 47.9 %   MCV 88.7 80.0 - 100.0 fL   MCH 30.3 26.0 - 34.0 pg   MCHC 34.2 30.0 - 36.0 g/dL   RDW 86.1 88.4 - 84.4 %   Platelets 101 (L) 150 - 400 K/uL   nRBC 0.0 0.0 - 0.2 %    Comment: Performed at [facility], [address], [place], [place] [address]  Basic metabolic panel with GFR     Status: Abnormal   Collection Time: 11/20/23  3:38 AM  Result Value Ref Range   Sodium 136 135 - 145 mmol/L   Potassium 4.3 3.5 - 5.1 mmol/L   Chloride 102 98 - 111 mmol/L   CO2 25 22 - 32 mmol/L   Glucose, Bld 106 (H) 70 - 99 mg/dL    Comment: Glucose reference range applies only to samples taken after fasting for at least 8 hours.   BUN 21 8 - 23 mg/dL   Creatinine, Ser 9.03 0.61 - 1.24 mg/dL   Calcium  7.9 (L) 8.9 - 10.3 mg/dL   GFR, Estimated >39 >39 mL/min    Comment: (NOTE) Calculated using the CKD-EPI Creatinine Equation (2021)    Anion gap 9 5 - 15    Comment: Performed at [facility], [address], [place], [place] [address]  CBC     Status: Abnormal   Collection Time: 11/20/23  3:38 AM  Result Value Ref Range   WBC 5.2 4.0 - 10.5 K/uL   RBC 2.96 (L) 4.22 - 5.81 MIL/uL   Hemoglobin 8.9 (L) 13.0 - 17.0 g/dL   HCT 73.4 (L) 60.9 - 47.9 %   MCV 89.5 80.0 - 100.0 fL   MCH 30.1 26.0 - 34.0 pg   MCHC 33.6 30.0 - 36.0 g/dL   RDW 86.1 88.4 - 84.4 %   Platelets 115 (L) 150 - 400 K/uL   nRBC 0.0 0.0 - 0.2 %  Comment: Performed at [facility], [address], [place], [place] [address]  CBC with Differential     Status: Abnormal   Collection Time: 11/23/23 11:41 AM  Result Value Ref Range   WBC 7.5 4.0 - 10.5 K/uL   RBC 3.68 (L) 4.22 - 5.81 MIL/uL   Hemoglobin 11.2 (L) 13.0 - 17.0 g/dL   HCT 66.2 (L) 60.9 - 47.9 %   MCV 91.6 80.0 - 100.0 fL   MCH 30.4 26.0 - 34.0 pg   MCHC 33.2 30.0 - 36.0 g/dL    RDW 85.7 88.4 - 84.4 %   Platelets 225 150 - 400 K/uL   nRBC 0.0 0.0 - 0.2 %   Neutrophils Relative % 75 %   Neutro Abs 5.6 1.7 - 7.7 K/uL   Lymphocytes Relative 13 %   Lymphs Abs 1.0 0.7 - 4.0 K/uL   Monocytes Relative 8 %   Monocytes Absolute 0.6 0.1 - 1.0 K/uL   Eosinophils Relative 2 %   Eosinophils Absolute 0.2 0.0 - 0.5 K/uL   Basophils Relative 1 %   Basophils Absolute 0.1 0.0 - 0.1 K/uL   Immature Granulocytes 1 %   Abs Immature Granulocytes 0.09 (H) 0.00 - 0.07 K/uL    Comment: Performed at [facility], [address], [place], [place] [address]  Comprehensive metabolic panel     Status: Abnormal   Collection Time: 11/23/23 11:41 AM  Result Value Ref Range   Sodium 137 135 - 145 mmol/L   Potassium 4.9 3.5 - 5.1 mmol/L   Chloride 101 98 - 111 mmol/L   CO2 23 22 - 32 mmol/L   Glucose, Bld 167 (H) 70 - 99 mg/dL    Comment: Glucose reference range applies only to samples taken after fasting for at least 8 hours.   BUN 22 8 - 23 mg/dL   Creatinine, Ser 8.66 (H) 0.61 - 1.24 mg/dL   Calcium  8.8 (L) 8.9 - 10.3 mg/dL   Total Protein 6.9 6.5 - 8.1 g/dL   Albumin  3.7 3.5 - 5.0 g/dL   AST 67 (H) 15 - 41 U/L   ALT 83 (H) 0 - 44 U/L   Alkaline Phosphatase 61 38 - 126 U/L   Total Bilirubin 0.9 0.0 - 1.2 mg/dL   GFR, Estimated 59 (L) >60 mL/min    Comment: (NOTE) Calculated using the CKD-EPI Creatinine Equation (2021)    Anion gap 13 5 - 15    Comment: Performed at [facility], [address], [place], [place] [address]  Troponin I (High Sensitivity)     Status: None   Collection Time: 11/23/23 11:41 AM  Result Value Ref Range   Troponin I (High Sensitivity) 14 <18 ng/L    Comment: (NOTE) Elevated high sensitivity troponin I (hsTnI) values and significant  changes across serial measurements may suggest ACS but many other  chronic and acute conditions are known to elevate hsTnI results.  Refer to the Links section for chest pain algorithms and additional   guidance. Performed at [facility], [address], [place], [place] [address]   Brain natriuretic peptide     Status: None   Collection Time: 11/23/23 11:41 AM  Result Value Ref Range   B Natriuretic Peptide 73.7 0.0 - 100.0 pg/mL    Comment: Performed at [facility], [address], [place], [place] [address]  I-stat chem 8, ED (not at [facility], [facility] or [facility])  Status: Abnormal   Collection Time: 11/23/23 11:46 AM  Result Value Ref Range   Sodium 135 135 - 145 mmol/L   Potassium 4.8 3.5 - 5.1 mmol/L   Chloride 101 98 - 111 mmol/L   BUN 24 (H) 8 - 23 mg/dL   Creatinine, Ser 8.59 (H) 0.61 - 1.24 mg/dL   Glucose, Bld 830 (H) 70 - 99 mg/dL    Comment: Glucose reference range applies only to samples taken after fasting for at least 8 hours.   Calcium , Ion 1.04 (L) 1.15 - 1.40 mmol/L   TCO2 24 22 - 32 mmol/L   Hemoglobin 10.5 (L) 13.0 - 17.0 g/dL   HCT 68.9 (L) 60.9 - 47.9 %  Troponin I (High Sensitivity)     Status: None   Collection Time: 11/23/23  1:22 PM  Result Value Ref Range   Troponin I (High Sensitivity) 13 <18 ng/L    Comment: (NOTE) Elevated high sensitivity troponin I (hsTnI) values and significant  changes across serial measurements may suggest ACS but many other  chronic and acute conditions are known to elevate hsTnI results.  Refer to the Links section for chest pain algorithms and additional  guidance. Performed at [facility], [address], [place], [place] [address]   HIV Antibody (routine testing w rflx)     Status: None   Collection Time: 11/23/23  1:22 PM  Result Value Ref Range   HIV Screen 4th Generation wRfx Non Reactive Non Reactive    Comment: Performed at [facility], [address], [place], [place] [address]  Magnesium      Status: None   Collection Time: 11/23/23  1:22 PM  Result Value Ref Range   Magnesium  2.0 1.7 - 2.4 mg/dL    Comment: Performed at [facility], [address] [place], [place] [address]   Comprehensive metabolic panel     Status: Abnormal   Collection Time: 11/24/23  5:59 AM  Result Value Ref Range   Sodium 137 135 - 145 mmol/L   Potassium 4.6 3.5 - 5.1 mmol/L   Chloride 101 98 - 111 mmol/L   CO2 22 22 - 32 mmol/L   Glucose, Bld 106 (H) 70 - 99 mg/dL    Comment: Glucose reference range applies only to samples taken after fasting for at least 8 hours.   BUN 22 8 - 23 mg/dL   Creatinine, Ser 8.82 0.61 - 1.24 mg/dL   Calcium  8.7 (L) 8.9 - 10.3 mg/dL   Total Protein 6.1 (L) 6.5 - 8.1 g/dL   Albumin  3.0 (L) 3.5 - 5.0 g/dL   AST 38 15 - 41 U/L   ALT 66 (H) 0 - 44 U/L   Alkaline Phosphatase 55 38 - 126 U/L   Total Bilirubin 1.1 0.0 - 1.2 mg/dL   GFR, Estimated >39 >39 mL/min    Comment: (NOTE) Calculated using the CKD-EPI Creatinine Equation (2021)    Anion gap 14 5 - 15    Comment: Performed at [facility], [address] [place], [place] [address]  CBC     Status: Abnormal   Collection Time: 11/24/23  5:59 AM  Result Value Ref Range   WBC 6.2 4.0 - 10.5 K/uL   RBC 3.51 (L) 4.22 - 5.81 MIL/uL   Hemoglobin 10.6 (L) 13.0 - 17.0 g/dL   HCT 68.7 (L) 60.9 - 47.9 %   MCV 88.9 80.0 - 100.0 fL   MCH 30.2 26.0 - 34.0  pg   MCHC 34.0 30.0 - 36.0 g/dL   RDW 85.7 88.4 - 84.4 %   Platelets 200 150 - 400 K/uL   nRBC 0.0 0.0 - 0.2 %    Comment: Performed at [facility], [address], [place], [place] [address]  Glucose, capillary     Status: None   Collection Time: 11/24/23  8:34 AM  Result Value Ref Range   Glucose-Capillary 90 70 - 99 mg/dL    Comment: Glucose reference range applies only to samples taken after fasting for at least 8 hours.  Comprehensive metabolic panel with GFR     Status: None   Collection Time: 12/06/23  9:14 AM  Result Value Ref Range   Glucose 87 70 - 99 mg/dL   BUN 19 8 - 27 mg/dL   Creatinine, Ser 8.78 0.76 - 1.27 mg/dL   eGFR 66 >40 fO/fpw/8.26   BUN/Creatinine Ratio 16 10 - 24   Sodium 138 134 - 144 mmol/L   Potassium 5.2  3.5 - 5.2 mmol/L   Chloride 104 96 - 106 mmol/L   CO2 22 20 - 29 mmol/L   Calcium  8.7 8.6 - 10.2 mg/dL   Total Protein 6.4 6.0 - 8.5 g/dL   Albumin  4.2 3.9 - 4.9 g/dL   Globulin, Total 2.2 1.5 - 4.5 g/dL   Bilirubin Total 0.5 0.0 - 1.2 mg/dL   Alkaline Phosphatase 98 47 - 123 IU/L    Comment:               **Please note reference interval change**   AST 18 0 - 40 IU/L   ALT 35 0 - 44 IU/L  CBC with Differential/Platelet     Status: Abnormal   Collection Time: 12/06/23  9:14 AM  Result Value Ref Range   WBC 3.7 3.4 - 10.8 x10E3/uL   RBC 3.56 (L) 4.14 - 5.80 x10E6/uL   Hemoglobin 10.6 (L) 13.0 - 17.7 g/dL   Hematocrit 66.5 (L) 62.4 - 51.0 %   MCV 94 79 - 97 fL   MCH 29.8 26.6 - 33.0 pg   MCHC 31.7 31.5 - 35.7 g/dL   RDW 85.5 88.3 - 84.5 %   Platelets 186 150 - 450 x10E3/uL   Neutrophils 56 Not Estab. %   Lymphs 30 Not Estab. %   Monocytes 9 Not Estab. %   Eos 4 Not Estab. %   Basos 1 Not Estab. %   Neutrophils Absolute 2.1 1.4 - 7.0 x10E3/uL   Lymphocytes Absolute 1.1 0.7 - 3.1 x10E3/uL   Monocytes Absolute 0.3 0.1 - 0.9 x10E3/uL   EOS (ABSOLUTE) 0.1 0.0 - 0.4 x10E3/uL   Basophils Absolute 0.0 0.0 - 0.2 x10E3/uL   Immature Granulocytes 0 Not Estab. %   Immature Grans (Abs) 0.0 0.0 - 0.1 x10E3/uL  Lipid panel     Status: Abnormal   Collection Time: 01/03/24  8:48 AM  Result Value Ref Range   Cholesterol, Total 97 (L) 100 - 199 mg/dL   Triglycerides 90 0 - 149 mg/dL   HDL 35 (L) >60 mg/dL   VLDL Cholesterol Cal 18 5 - 40 mg/dL   LDL Chol Calc (NIH) 44 0 - 99 mg/dL   Chol/HDL Ratio 2.8 0.0 - 5.0 ratio    Comment:                                   T. Chol/HDL  Ratio                                             Men  Women                               1/2 Avg.Risk  3.4    3.3                                   Avg.Risk  5.0    4.4                                2X Avg.Risk  9.6    7.1                                3X Avg.Risk 23.4   11.0   Comprehensive metabolic panel with  GFR     Status: None   Collection Time: 01/03/24  8:48 AM  Result Value Ref Range   Glucose 92 70 - 99 mg/dL   BUN 15 8 - 27 mg/dL   Creatinine, Ser 8.85 0.76 - 1.27 mg/dL   eGFR 70 >40 fO/fpw/8.26   BUN/Creatinine Ratio 13 10 - 24   Sodium 143 134 - 144 mmol/L   Potassium 4.9 3.5 - 5.2 mmol/L   Chloride 106 96 - 106 mmol/L   CO2 24 20 - 29 mmol/L   Calcium  9.2 8.6 - 10.2 mg/dL   Total Protein 6.8 6.0 - 8.5 g/dL   Albumin  4.5 3.9 - 4.9 g/dL   Globulin, Total 2.3 1.5 - 4.5 g/dL   Bilirubin Total 0.3 0.0 - 1.2 mg/dL   Alkaline Phosphatase 83 47 - 123 IU/L   AST 19 0 - 40 IU/L   ALT 21 0 - 44 IU/L        Beverley KATHEE Hummer, MD  I,Emily Lagle,acting as a scribe for Beverley KATHEE Hummer, MD.,have documented all relevant documentation on the behalf of Beverley KATHEE Hummer, MD.  LILLETTE Beverley KATHEE Hummer, MD, have reviewed all documentation for this visit. The documentation on 02/01/2024 for the exam, diagnosis, procedures, and orders are all accurate and complete.

## 2024-02-01 NOTE — Patient Instructions (Addendum)
 It was very nice to see you today!  VISIT SUMMARY: During your visit, we discussed your recent abdominal symptoms following your coronary artery bypass surgery, including episodes of vomiting and indigestion. We also reviewed your respiratory symptoms, blood pressure, and overall health status.  YOUR PLAN: GASTROESOPHAGEAL REFLUX DISEASE (GERD): You have been experiencing bloating and vomiting after meals, likely due to GERD. -Start taking Protonix  40 mg daily, 30 minutes before breakfast. -You have been referred to a gastroenterologist. -Eat smaller meals, avoid caffeine and alcohol , and avoid lying down late at night. -Elevate the head of your bed. -Follow up if there is no improvement or if symptoms worsen.  HYPERTENSION: Your blood pressure was slightly elevated, which may be due to recent discomfort. -Start taking amlodipine 5 mg daily in addition to your current medications. -Follow up with your primary care doctor for ongoing blood pressure management.  CORONARY ARTERY DISEASE, POST-CABG: You are recovering from coronary artery bypass surgery and currently undergoing cardiac rehab. -Continue with your cardiac rehab. -Follow up with your cardiologist for heart rhythm management.  OBESITY: Your BMI is 33.33, which may contribute to your GERD symptoms. -Make dietary changes to include smaller meals and more whole fiber vegetables.  BRONCHITIS, RESOLVING: You have been treated for bronchitis and are currently managing a cough with clear mucus production. -Continue taking Mucinex DM for your cough. -Avoid alcohol -containing cough remedies.  LEFT PLEURAL EFFUSION, SMALL, POST-OPERATIVE: A small left pleural effusion was noted on previous imaging. -A chest x-ray has been ordered to evaluate the current status of the pleural effusion.  Return if symptoms worsen or fail to improve.   Take care, Arvella Hummer, MD, MS   PLEASE NOTE:  If you had any lab tests, please let us  know if  you have not heard back within a few days. You may see your results on mychart before we have a chance to review them but we will give you a call once they are reviewed by us .   If we ordered any referrals today, please let us  know if you have not heard from their office within the next week.   If you had any urgent prescriptions sent in today, please check with the pharmacy within an hour of our visit to make sure the prescription was transmitted appropriately.   Please try these tips to maintain a healthy lifestyle:  Eat at least 3 REAL meals and 1-2 snacks per day.  Aim for no more than 5 hours between eating.  If you eat breakfast, please do so within one hour of getting up.   Each meal should contain half fruits/vegetables, one quarter protein, and one quarter carbs (no bigger than a computer mouse)  Cut down on sweet beverages. This includes juice, soda, and sweet tea.   Drink at least 1 glass of water  with each meal and aim for at least 8 glasses per day  Exercise at least 150 minutes every week.

## 2024-02-01 NOTE — Progress Notes (Signed)
 Cardiology Office Note:    Date:  02/05/2024   ID:  Anthony Boone, DOB September 29, 1955, MRN [id number]  PCP:  Almarie Waddell NOVAK, NP  Cardiologist:  Redell Leiter, MD    Referring MD: Almarie Waddell NOVAK, NP    ASSESSMENT:    1. Coronary artery disease of native artery of native heart with stable angina pectoris   2. S/P CABG x 3   3. Elevated lipoprotein(a)   4. Essential hypertension   5. Syncope and collapse    PLAN:    In order of problems listed above:  Danthony has made a good recovery from bypass surgery [place]  Heart Association class I on good therapy including aspirin  lipid-lowering rosuvastatin  LDL is ideal Better blood pressure control by transition metoprolol  to carvedilol continue lisinopril  amlodipine  purchase a new device He has had no recurrent syncope   Next appointment: 3 months   Medication Adjustments/Labs and Tests Ordered: Current medicines are reviewed at length with the patient today.  Concerns regarding medicines are outlined above.  Orders Placed This Encounter  Procedures   EKG 12-Lead   No orders of the defined types were placed in this encounter.    History of Present Illness:    Anthony Boone is a 68 y.o. male with a hx of CAD recognized following trauma with fall thorax coronary artery calcification on CT hyperlipidemia with elevated LP(a) hypertension and subsequent CABG 11/16/2023.  He was last seen by me 11/10/2023.  He was seen most recently by Delon Hoover nurse practitioner in 12/06/2023 following bypass surgery complicated with early postoperative atrial fibrillation resuming sinus rhythm with IV amiodarone  and no recurrence.  With concern of anemia hemoglobin was rechecked mildly reduced 10.6 and his lipid panel showed LDL at target 44 cholesterol 97 non-HDL cholesterol 62.  Compliance with diet, lifestyle and medications: Yes  He feels very well fully recovered inquires if he needs amiodarone  he does and its discontinued Like to switch to  Prilosec more effective done He has an old blood pressure cuff unsure if it is effective will buy a new Omron and we will transition metoprolol  to carvedilol for better blood pressure control He is not having angina edema shortness of breath palpitation or syncope Past Medical History:  Diagnosis Date   3-vessel CAD 11/20/2023   Coronary artery calcification seen on CAT scan 10/09/2023   Coronary artery disease 11/16/2023   Erectile dysfunction 01/12/2018   Former smoker 01/15/2018   Hyperlipidemia    Hypertension    Intermediate coronary syndrome (HCC) 11/15/2023   Medication management 07/29/2013   Obesity (BMI 30.0-34.9) 01/12/2018   Other abnormal glucose 07/29/2013   Paroxysmal atrial fibrillation (HCC) 11/20/2023   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/2023   Syncope and collapse 11/23/2023   Vitamin D  deficiency     Current Medications: Current Meds  Medication Sig   amiodarone  (PACERONE ) 200 MG tablet Take 200 mg by mouth daily.   amLODipine  (NORVASC ) 5 MG tablet Take 1 tablet (5 mg total) by mouth daily.   ascorbic acid (VITAMIN C) 500 MG tablet Take 1,000 mg by mouth daily.   aspirin  EC 81 MG tablet Take 81 mg by mouth daily. Swallow whole.   furosemide  (LASIX ) 20 MG tablet Take 1 tablet (20 mg total) by mouth daily as needed for edema (for weight gain of 3 pds in 1 day or 5 pds in 1 week).   lisinopril  (ZESTRIL ) 20 MG tablet Take 1 tablet (20 mg total) by mouth daily for blood  pressure   metoprolol  tartrate (LOPRESSOR ) 25 MG tablet Take 1 tablet (25 mg total) by mouth 2 (two) times daily.   pantoprazole  (PROTONIX ) 40 MG tablet Take 1 tablet (40 mg total) by mouth daily.   rosuvastatin  (CRESTOR ) 20 MG tablet Take 1 tablet (20 mg total) by mouth daily.      EKGs/Labs/Other Studies Reviewed:    The following studies were reviewed today:  Cardiac Studies & Procedures   ______________________________________________________________________________________________ CARDIAC  CATHETERIZATION  CARDIAC CATHETERIZATION 11/15/2023  Conclusion Images from the original result were not included. Cardiac Catheterization 11/15/23: Hemodynamic data: LV: 161/3, EDP 23 mmHg.  Ao 156/90, mean 121 mmHg.  No pressure gradient across the aortic valve.  Angiographic data: LM: Distal left main has a calcific and complex irregular 80% stenosis. LAD: LAD is calcified in the proximal segment.  There is a eccentric 60% stenosis in the proximal LAD.  Large D2.  Mild disease in the mid and distal LAD. LCx: Very large caliber vessel giving origin to a large OM1.  Proximal Cx has a 90% calcific stenosis, large OM1 has a proximal 90% stenosis followed by distal 60% stenosis. RI: Moderate caliber vessel, supplies large area.  Ostium has ectasia. RCA: Has anterior origin.  There is tandem 40% proximal and a mid 60% stenosis.  PDA PL branches are very small and diffusely diseased and PL has a focal ostial 80% stenosis.   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  Hence discharge made to admit the patient for inpatient artery bypass grafting.  Patient will be started on IV heparin  as well.  Findings Coronary Findings Diagnostic  Dominance: Right  Left Main Mid LM lesion is 80% stenosed.  Left Anterior Descending Prox LAD lesion is 60% stenosed.  Ramus Intermedius Non-stenotic Ramus lesion.  Left Circumflex Prox Cx lesion is 80% stenosed.  First Obtuse Marginal Branch 1st Mrg-1 lesion is 90% stenosed. 1st Mrg-2 lesion is 60% stenosed.  Right Coronary Artery Prox RCA lesion is 40% stenosed. Mid RCA lesion is 60% stenosed.  Right Posterior Descending Artery There is moderate disease in the vessel.  Right Posterior Atrioventricular Artery There is moderate disease in the vessel. RPAV lesion is 80% stenosed.  Intervention  No  interventions have been documented.     ECHOCARDIOGRAM  ECHOCARDIOGRAM COMPLETE 11/15/2023  Narrative ECHOCARDIOGRAM REPORT    Patient Name:   Anthony Boone Date of Exam: 11/15/2023 Medical Rec #:  [id number]      Height:       68.0 in Accession #:    [id number]     Weight:       215.0 lb Date of Birth:  1955/06/23      BSA:          2.108 m Patient Age:    67 years       BP:           139/84 mmHg Patient Gender: M              HR:           68 bpm. Exam Location:  Inpatient  Procedure: 2D Echo, Cardiac Doppler and Color Doppler (Both Spectral and Color Flow Doppler were utilized during procedure).  Indications:    CAD  History:        Patient has no prior history of Echocardiogram examinations. CAD; Risk Factors:Hypertension and Former Smoker.  Sonographer:  Juliene Rucks Referring Phys: [id number] BAILEY S SU  IMPRESSIONS   1. Left ventricular ejection fraction, by estimation, is 50 to 55%. The left ventricle has low normal function. Left ventricular endocardial border not optimally defined to evaluate regional wall motion. There is mild left ventricular hypertrophy. Left ventricular diastolic parameters were normal. 2. Right ventricular systolic function is normal. The right ventricular size is normal. Tricuspid regurgitation signal is inadequate for assessing PA pressure. 3. The mitral valve is normal in structure. Trivial mitral valve regurgitation. No evidence of mitral stenosis. 4. The aortic valve was not well visualized. Aortic valve regurgitation is not visualized. No aortic stenosis is present.  FINDINGS Left Ventricle: Left ventricular ejection fraction, by estimation, is 50 to 55%. The left ventricle has low normal function. Left ventricular endocardial border not optimally defined to evaluate regional wall motion. The left ventricular internal cavity size was normal in size. There is mild left ventricular hypertrophy. Left ventricular diastolic parameters were  normal.  Right Ventricle: The right ventricular size is normal. No increase in right ventricular wall thickness. Right ventricular systolic function is normal. Tricuspid regurgitation signal is inadequate for assessing PA pressure.  Left Atrium: Left atrial size was normal in size.  Right Atrium: Right atrial size was normal in size.  Pericardium: There is no evidence of pericardial effusion.  Mitral Valve: The mitral valve is normal in structure. Trivial mitral valve regurgitation. No evidence of mitral valve stenosis.  Tricuspid Valve: The tricuspid valve is normal in structure. Tricuspid valve regurgitation is trivial.  Aortic Valve: The aortic valve was not well visualized. Aortic valve regurgitation is not visualized. No aortic stenosis is present.  Pulmonic Valve: The pulmonic valve was not well visualized. Pulmonic valve regurgitation is not visualized.  Aorta: The aortic root is normal in size and structure.  IAS/Shunts: The interatrial septum was not well visualized.   LEFT VENTRICLE PLAX 2D LVIDd:         4.70 cm      Diastology LVIDs:         3.50 cm      LV e' medial:    6.96 cm/s LV PW:         1.10 cm      LV E/e' medial:  11.1 LV IVS:        1.20 cm      LV e' lateral:   10.70 cm/s LVOT diam:     2.20 cm      LV E/e' lateral: 7.2 LV SV:         64 LV SV Index:   30 LVOT Area:     3.80 cm  LV Volumes (MOD) LV vol d, MOD A2C: 145.0 ml LV vol d, MOD A4C: 143.0 ml LV vol s, MOD A2C: 73.5 ml LV vol s, MOD A4C: 70.5 ml LV SV MOD A2C:     71.5 ml LV SV MOD A4C:     143.0 ml LV SV MOD BP:      71.7 ml  RIGHT VENTRICLE RV Basal diam:  2.90 cm RV Mid diam:    2.60 cm  LEFT ATRIUM             Index        RIGHT ATRIUM           Index LA diam:        3.90 cm 1.85 cm/m   RA Area:     13.20 cm LA Vol (A2C):   47.2 ml  22.40 ml/m  RA Volume:   28.50 ml  13.52 ml/m LA Vol (A4C):   47.2 ml 22.40 ml/m LA Biplane Vol: 48.6 ml 23.06 ml/m AORTIC VALVE LVOT Vmax:    79.90 cm/s LVOT Vmean:  53.400 cm/s LVOT VTI:    0.168 m  AORTA Ao Root diam: 3.30 cm  MITRAL VALVE MV Area (PHT): 3.60 cm    SHUNTS MV Decel Time: 211 msec    Systemic VTI:  0.17 m MV E velocity: 77.40 cm/s  Systemic Diam: 2.20 cm MV A velocity: 92.10 cm/s MV E/A ratio:  0.84  Lonni Nanas MD Electronically signed by Lonni Nanas MD Signature Date/Time: 11/15/2023/6:05:14 PM    Final   TEE  ECHO INTRAOPERATIVE TEE 11/16/2023  Narrative *INTRAOPERATIVE TRANSESOPHAGEAL REPORT *    Patient Name:   Anthony Boone Date of Exam: 11/16/2023 Medical Rec #:  [id number]      Height:       68.0 in Accession #:    [id number]     Weight:       215.0 lb Date of Birth:  July 09, 1955      BSA:          2.11 m Patient Age:    67 years       BP:           189/69 mmHg Patient Gender: M              HR:           72 bpm. Exam Location:  Anesthesiology  Transesophogeal exam was perform intraoperatively during surgical procedure. Patient was closely monitored under general anesthesia during the entirety of examination.  Indications:     CABG Performing Phys: [id number] BAILEY S SU  Complications: No known complications during this procedure. POST-OP IMPRESSIONS _ Right Ventricle: mildly reduced function. The cavity was mildly dilated. _ Aorta: The aorta appears unchanged from pre-bypass. _ Left Atrial Appendage: The left atrial appendage appears unchanged from pre-bypass. _ Aortic Valve: The aortic valve appears unchanged from pre-bypass. _ Mitral Valve: There is mild regurgitation. _ Tricuspid Valve: There is moderate regurgitation.Discussed with Dr. Daniel and Kerrin. _ Pulmonic Valve: The pulmonic valve appears unchanged from pre-bypass.  PRE-OP FINDINGS Left Ventricle: The left ventricle has normal systolic function, with an ejection fraction of 55-60%. The cavity size was normal. There is mild concentric left ventricular hypertrophy.   Right Ventricle: The right  ventricle has normal systolic function. The cavity was normal. There is no increase in right ventricular wall thickness.  Left Atrium: Left atrial size was normal in size. No left atrial/left atrial appendage thrombus was detected.  Right Atrium: Right atrial size was normal in size.  Interatrial Septum: No atrial level shunt detected by color flow Doppler. There is no evidence of a patent foramen ovale.  Pericardium: There is no evidence of pericardial effusion.  Mitral Valve: The mitral valve is normal in structure. Mitral valve regurgitation is trivial by color flow Doppler. There is no evidence of mitral valve vegetation. There is No evidence of mitral stenosis. There is mild thickening present.  Tricuspid Valve: The tricuspid valve was normal in structure. Tricuspid valve regurgitation is trivial by color flow Doppler. There is no evidence of tricuspid valve vegetation.  Aortic Valve: The aortic valve is normal in structure. Aortic valve regurgitation was not visualized by color flow Doppler. There is no stenosis of the aortic valve. There is no evidence of aortic valve vegetation.   Pulmonic Valve: The  pulmonic valve was normal in structure. Pulmonic valve regurgitation is trivial by color flow Doppler.   Aorta: There is evidence of plaque in the descending aorta; Grade II, measuring 2-36mm in size.  Shunts: There is no evidence of an atrial septal defect.   Norleen Pope MD Electronically signed by Norleen Pope MD Signature Date/Time: 11/16/2023/4:01:47 PM    Final  MONITORS  LONG TERM MONITOR (3-14 DAYS) 11/02/2023  Narrative Patch Wear Time:  13 days and 15 hours (2025-08-01T19:09:38-0400 to 2025-08-15T10:22:16-0400)  HR 35 - 132, average 71 bpm. 1 nonsustained SVT (longest 8 beats). No atrial fibrillation detected. Rare supraventricular ectopy. Rare ventricular ectopy. No sustained arrhythmias. Symptom trigger episodes correspond to sinus rhythm.   Fonda Kitty Cardiac Electrophysiology   CT SCANS  CT CORONARY MORPH W/CTA COR W/SCORE 10/31/2023  Addendum 11/03/2023  9:13 PM ADDENDUM REPORT: 11/03/2023 21:11  EXAM: OVER-READ INTERPRETATION  CT CHEST  The following report is an over-read performed by radiologist Dr. Suzen Dials of [facility] on 11/03/2023. This over-read does not include interpretation of cardiac or coronary anatomy or pathology. The coronary calcium  score/coronary CTA interpretation by the cardiologist is attached.  COMPARISON:  None.  FINDINGS: Cardiovascular: There are no significant extracardiac vascular findings.  Mediastinum/Nodes: There are no enlarged lymph nodes within the visualized mediastinum.  Lungs/Pleura: There is no pleural effusion. Mild right middle lobe linear scarring and/or atelectasis is seen. Mild atelectatic changes are also noted within the posterior aspect of the left lung base.  Upper abdomen: No significant findings in the visualized upper abdomen.  Musculoskeletal/Chest wall: No chest wall mass or suspicious osseous findings within the visualized chest.  IMPRESSION: No significant extracardiac findings within the visualized chest.   Electronically Signed By: Suzen Dials M.D. On: 11/03/2023 21:11  Narrative CLINICAL DATA:  CP  EXAM: Cardiac/Coronary  CTA  TECHNIQUE: The patient was scanned on a GE Apex scanner.  FINDINGS: A 120 kV prospective scan was triggered in the descending thoracic aorta at 111 HU's. Axial non-contrast 3 mm slices were carried out through the heart. The data set was analyzed on a dedicated work station and scored using the Agatson method. Gantry rotation speed was 250 msecs and collimation was .6 mm. No beta blockade and 0.8 mg of sl NTG was given. The 3D data set was reconstructed at 75% of the R-R cycle. Diastolic phases were analyzed on a dedicated work station using MPR, MIP and VRT modes. The patient received 80  cc of contrast.  Aorta: Normal size.  No calcifications.  No dissection.  Aortic Valve:  Trileaflet.  No calcifications.  Coronary Arteries:  Normal coronary origin.  Right dominance.  RCA is a large dominant artery that gives rise to PDA and PLA. This artery is diffusely diseased in its proximal and mid portion with numerous mixed plaques and moderate stenosis of 50-69%.  Left main is a large artery that gives rise to LAD and LCX arteries as well as moderate size Intermediate Branch. In the distal portion of LM there is mixed plaque with mild stenosis of 25-49%.  LAD is a large vessel that diffusely diseased in its proximal and mid portion with numerous mostly calcified plaques with moderate stenosis of 50-69%. This artery gives rise to small D1 and large D2. D2 has mixed plaque in its proximal portion with moderate stenosis of 50-69%.  Intermediate Branch has mixed plaques in its proximal portion with mild stenosis of 25-49%.  LCX is a non-dominant artery that gives rise to  one large OM1 branch. There is mixed plaque in the proximal portion of this artery with severe stenosis of 70-99%. Large OM1 has mixed plaque in its proximal with moderate stenosis of 50-69%.  Other findings:  Normal pulmonary vein drainage into the left atrium. Rt middle pulmonary vein noted - normal variant.  Normal left atrial appendage without a thrombus.  Normal size of the pulmonary artery.  IMPRESSION: 1. Coronary calcium  score of 1393. This was 48 percentile for age and sex matched control.  2. Normal coronary origin with right dominance.  3. CAD-RADS 4 Severe stenosis. (70-99%) prox CX. Cardiac catheterization or CT FFR is recommended. Consider symptom-guided anti-ischemic pharmacotherapy as well as risk factor modification per guideline directed care.  4. Plaque analysis: TPV 1478 mm3, 91 st percentile, calcified plaque 407 mm3, non calcified plaque 1071 mm3, Extensive  TPV.  Electronically Signed: By: Lamar Fitch M.D. On: 11/03/2023 18:20     ______________________________________________________________________________________________      EKG Interpretation Date/Time:  Monday February 05 2024 15:17:23 EST Ventricular Rate:  66 PR Interval:  178 QRS Duration:  80 QT Interval:  398 QTC Calculation: 417 R Axis:   50  Text Interpretation: Normal sinus rhythm Normal ECG When compared with ECG of 06-Dec-2023 08:24, No significant change since last tracing Confirmed by Monetta Rogue ([id number]) on 02/05/2024 3:21:14 PM   Recent Labs: 10/09/2023: TSH 1.48 11/23/2023: B Natriuretic Peptide 73.7; Magnesium  2.0 12/06/2023: Hemoglobin 10.6; Platelets 186 01/03/2024: ALT 21; BUN 15; Creatinine, Ser 1.14; Potassium 4.9; Sodium 143  Recent Lipid Panel    Component Value Date/Time   CHOL 97 (L) 01/03/2024 0848   TRIG 90 01/03/2024 0848   HDL 35 (L) 01/03/2024 0848   CHOLHDL 2.8 01/03/2024 0848   CHOLHDL 4 10/09/2023 1546   VLDL 65.2 (H) 10/09/2023 1546   LDLCALC 44 01/03/2024 0848   LDLCALC 95 12/23/2022 1117    Physical Exam:    VS:  BP (!) 140/70   Pulse 66   Ht 5' 7 (1.702 m)   Wt 214 lb (97.1 kg)   SpO2 94%   BMI 33.52 kg/m     Wt Readings from Last 3 Encounters:  02/05/24 214 lb (97.1 kg)  02/01/24 212 lb 12.8 oz (96.5 kg)  12/28/23 215 lb (97.5 kg)     GEN:  Well nourished, well developed in no acute distress HEENT: Normal NECK: No JVD; No carotid bruits LYMPHATICS: No lymphadenopathy CARDIAC: RRR, no murmurs, rubs, gallops RESPIRATORY:  Clear to auscultation without rales, wheezing or rhonchi  ABDOMEN: Soft, non-tender, non-distended MUSCULOSKELETAL:  No edema; No deformity  SKIN: Warm and dry NEUROLOGIC:  Alert and oriented x 3 PSYCHIATRIC:  Normal affect    Signed, Rogue Monetta, MD  02/05/2024 3:32 PM     [facility]

## 2024-02-02 LAB — H. PYLORI BREATH TEST: H. pylori Breath Test: NOT DETECTED

## 2024-02-05 ENCOUNTER — Ambulatory Visit: Attending: Cardiology | Admitting: Cardiology

## 2024-02-05 ENCOUNTER — Encounter: Payer: Self-pay | Admitting: Cardiology

## 2024-02-05 VITALS — BP 140/70 | HR 66 | Ht 67.0 in | Wt 214.0 lb

## 2024-02-05 DIAGNOSIS — I2510 Atherosclerotic heart disease of native coronary artery without angina pectoris: Secondary | ICD-10-CM | POA: Diagnosis not present

## 2024-02-05 DIAGNOSIS — R55 Syncope and collapse: Secondary | ICD-10-CM | POA: Diagnosis not present

## 2024-02-05 DIAGNOSIS — Z951 Presence of aortocoronary bypass graft: Secondary | ICD-10-CM | POA: Diagnosis not present

## 2024-02-05 DIAGNOSIS — E7841 Elevated Lipoprotein(a): Secondary | ICD-10-CM | POA: Insufficient documentation

## 2024-02-05 DIAGNOSIS — I25118 Atherosclerotic heart disease of native coronary artery with other forms of angina pectoris: Secondary | ICD-10-CM | POA: Diagnosis not present

## 2024-02-05 DIAGNOSIS — I499 Cardiac arrhythmia, unspecified: Secondary | ICD-10-CM | POA: Insufficient documentation

## 2024-02-05 DIAGNOSIS — I10 Essential (primary) hypertension: Secondary | ICD-10-CM | POA: Diagnosis not present

## 2024-02-05 MED ORDER — OMEPRAZOLE 20 MG PO CPDR: 20.0000 mg | DELAYED_RELEASE_CAPSULE | Freq: Every day | ORAL | 3 refills | Status: DC

## 2024-02-05 NOTE — Addendum Note (Signed)
 Addended by: SHERRE ADE I on: 02/05/2024 03:49 PM   Modules accepted: Orders

## 2024-02-05 NOTE — Patient Instructions (Signed)
 Medication Instructions:  Your physician has recommended you make the following change in your medication:   START: Prilosec 20 mg daily STOP: Protonix  STOP: Amiodarone   *If you need a refill on your cardiac medications before your next appointment, please call your pharmacy*  Lab Work: None If you have labs (blood work) drawn today and your tests are completely normal, you will receive your results only by: MyChart Message (if you have MyChart) OR A paper copy in the mail If you have any lab test that is abnormal or we need to change your treatment, we will call you to review the results.  Testing/Procedures: None  Follow-Up: At [facility], you and your health needs are our priority.  As part of our continuing mission to provide you with exceptional heart care, our providers are all part of one team.  This team includes your primary Cardiologist (physician) and Advanced Practice Providers or APPs (Physician Assistants and Nurse Practitioners) who all work together to provide you with the care you need, when you need it.  Your next appointment:   3 month(s)  Provider:   Redell Leiter, MD    We recommend signing up for the patient portal called MyChart.  Sign up information is provided on this After Visit Summary.  MyChart is used to connect with patients for Virtual Visits (Telemedicine).  Patients are able to view lab/test results, encounter notes, upcoming appointments, etc.  Non-urgent messages can be sent to your provider as well.   To learn more about what you can do with MyChart, go to [email or web address].   Other Instructions Purchase an Omron blood pressure cuff

## 2024-02-07 ENCOUNTER — Ambulatory Visit: Payer: Self-pay | Admitting: Family

## 2024-02-11 ENCOUNTER — Other Ambulatory Visit: Payer: Self-pay | Admitting: Family Medicine

## 2024-02-11 DIAGNOSIS — I10 Essential (primary) hypertension: Secondary | ICD-10-CM

## 2024-02-13 NOTE — Telephone Encounter (Signed)
 [name]

## 2024-02-16 NOTE — Telephone Encounter (Signed)
 Anthony Boone

## 2024-02-26 NOTE — Progress Notes (Unsigned)
 Cardiology Office Note:    Date:  02/27/2024   ID:  LAIRD RUNNION, DOB [birth year], MRN [id number]  PCP:  Almarie Waddell NOVAK, NP  Cardiologist:  Redell Leiter, MD    Referring MD: Almarie Waddell NOVAK, NP    ASSESSMENT:    1. Elevated lipoprotein(a)   2. Mixed hyperlipidemia   3. S/P CABG x 3   4. Essential hypertension   5. Primary hypertension    PLAN:    In order of problems listed above:  Discontinue statin wait 2 weeks initiate bempedoic acid  follow-up labs after 2 months goal LDL 30-50 Renew antihypertensive agent continue aspirin  He request duplex of left leg history of previous DVT and wants to be sure he is not having a recurrence with some more distal pain in the area of the knee no obvious signs of DVT on physical exam Send me a list of home blood pressures good technique validated device 2 weeks goal less than 130 systolic   Next appointment: 6 months   Medication Adjustments/Labs and Tests Ordered: Current medicines are reviewed at length with the patient today.  Concerns regarding medicines are outlined above.  No orders of the defined types were placed in this encounter.  No orders of the defined types were placed in this encounter.    History of Present Illness:    Anthony Boone is a 68 y.o. male with a hx of CAD with CABG 11/16/2023 he had early postoperative atrial fibrillation resuming sinus rhythm with IV amiodarone  and no recurrence.  Other problems include hyperlipidemia with elevated LP(a) and hypertension.  Last seen 02/05/2024.  Compliance with diet, lifestyle and medications: Yes  Several issues First cannot tolerate rosuvastatin  with severe proximal muscle pain discontinue wait another 2 weeks and substitute bempedoic acid  follow-up labs 2 months including in ApoB, if not tolerated or effective then inclisiran or Repatha Home blood pressure running less than 1 35-140/80 Request 40 mg Prilosec Otherwise has made a full recovery no cardiovascular  symptoms edema shortness of breath chest pain palpitation or syncope Past Medical History:  Diagnosis Date   3-vessel CAD 11/20/2023   Arrhythmia 02/05/2024   Coronary artery calcification seen on CAT scan 10/09/2023   Coronary artery disease 11/16/2023   Dyspepsia and disorder of function of stomach 02/01/2024   Erectile dysfunction 01/12/2018   Former smoker 01/15/2018   Hyperlipidemia    Hypertension    Intermediate coronary syndrome (HCC) 11/15/2023   Medication management 07/29/2013   Obesity (BMI 30.0-34.9) 01/12/2018   Other abnormal glucose 07/29/2013   Paroxysmal atrial fibrillation (HCC) 11/20/2023   Pleural effusion on left 02/01/2024   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 Cardiac Catheterization 11/15/23: Hemodynamic data: LV: 161/3, EDP 23 mmHg.  Ao 156/90, mean 121 mmHg.  No pressure gradient across the aortic valve.  Angiographic data: LM: Distal left main has a calcific and complex irregular 80% stenosis. LAD: LAD is calcified in the proximal segment.  There is a eccentric 60% stenosis in the proximal LAD.  Large D2.  Mild disease in the mid and distal LAD. LCx: Very large caliber vessel giving origin to a large OM1.  Proximal Cx has  a 90% calcific stenosis, large OM1 has a proximal 90% stenosis followed by distal 60% stenosis. RI: Moderate caliber vessel, supplies large area.  Ostium has ectasia. RCA: Has anterior origin.  There is tandem 40% proximal and a mid 60% stenosis.  PDA PL branches are very small and diffusely diseased and PL  has a focal ostial 80% stenosis.   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  Hence discharge made to admit the patient for inpatient artery bypass grafting.  Patient will be started on IV heparin  as well.  Findings Coronary Findings Diagnostic  Dominance: Right  Left Main Mid LM lesion is 80% stenosed.  Left Anterior Descending Prox LAD lesion is 60% stenosed.  Ramus Intermedius Non-stenotic Ramus lesion.  Left Circumflex Prox Cx lesion is 80% stenosed.  First Obtuse Marginal Branch 1st Mrg-1 lesion is 90% stenosed. 1st Mrg-2 lesion is 60% stenosed.  Right Coronary Artery Prox RCA lesion is 40% stenosed. Mid RCA lesion is 60% stenosed.  Right Posterior Descending Artery There is moderate disease in the vessel.  Right Posterior Atrioventricular Artery There is moderate disease in the vessel. RPAV lesion is 80% stenosed.  Intervention  No interventions have been documented.     ECHOCARDIOGRAM  ECHOCARDIOGRAM COMPLETE 11/15/2023  Narrative ECHOCARDIOGRAM REPORT    Patient Name:   Anthony Boone Date of Exam: 11/15/2023 Medical Rec #:  [id number]      Height:       68.0 in Accession #:    [id number]     Weight:       215.0 lb Date of Birth:  11-21-1955      BSA:          2.108 m Patient Age:    68 years       BP:           139/84 mmHg Patient Gender: M              HR:           68 bpm. Exam Location:  Inpatient  Procedure: 2D Echo, Cardiac Doppler and Color Doppler (Both Spectral and Color Flow Doppler were utilized during procedure).  Indications:    CAD  History:        Patient has no prior history of Echocardiogram examinations. CAD; Risk Factors:Hypertension and Former Smoker.  Sonographer:    Juliene Rucks Referring Phys: [id number] BAILEY S SU  IMPRESSIONS   1. Left ventricular ejection fraction, by  estimation, is 50 to 55%. The left ventricle has low normal function. Left ventricular endocardial border not optimally defined to evaluate regional wall motion. There is mild left ventricular hypertrophy. Left ventricular diastolic parameters were normal. 2. Right ventricular systolic function is normal. The right ventricular size is normal. Tricuspid regurgitation signal is inadequate for assessing PA pressure. 3. The mitral valve is normal in structure. Trivial mitral valve regurgitation. No evidence of mitral stenosis. 4. The aortic valve was not well visualized. Aortic valve regurgitation is not visualized. No aortic stenosis is present.  FINDINGS Left Ventricle: Left ventricular ejection fraction, by estimation, is 50 to 55%. The left ventricle has low normal function. Left ventricular endocardial border not optimally defined to evaluate regional wall motion. The left ventricular internal cavity size was normal in size. There is mild left ventricular hypertrophy. Left ventricular diastolic parameters were normal.  Right Ventricle: The right ventricular size is normal. No  increase in right ventricular wall thickness. Right ventricular systolic function is normal. Tricuspid regurgitation signal is inadequate for assessing PA pressure.  Left Atrium: Left atrial size was normal in size.  Right Atrium: Right atrial size was normal in size.  Pericardium: There is no evidence of pericardial effusion.  Mitral Valve: The mitral valve is normal in structure. Trivial mitral valve regurgitation. No evidence of mitral valve stenosis.  Tricuspid Valve: The tricuspid valve is normal in structure. Tricuspid valve regurgitation is trivial.  Aortic Valve: The aortic valve was not well visualized. Aortic valve regurgitation is not visualized. No aortic stenosis is present.  Pulmonic Valve: The pulmonic valve was not well visualized. Pulmonic valve regurgitation is not visualized.  Aorta: The aortic root  is normal in size and structure.  IAS/Shunts: The interatrial septum was not well visualized.   LEFT VENTRICLE PLAX 2D LVIDd:         4.70 cm      Diastology LVIDs:         3.50 cm      LV e' medial:    6.96 cm/s LV PW:         1.10 cm      LV E/e' medial:  11.1 LV IVS:        1.20 cm      LV e' lateral:   10.70 cm/s LVOT diam:     2.20 cm      LV E/e' lateral: 7.2 LV SV:         64 LV SV Index:   30 LVOT Area:     3.80 cm  LV Volumes (MOD) LV vol d, MOD A2C: 145.0 ml LV vol d, MOD A4C: 143.0 ml LV vol s, MOD A2C: 73.5 ml LV vol s, MOD A4C: 70.5 ml LV SV MOD A2C:     71.5 ml LV SV MOD A4C:     143.0 ml LV SV MOD BP:      71.7 ml  RIGHT VENTRICLE RV Basal diam:  2.90 cm RV Mid diam:    2.60 cm  LEFT ATRIUM             Index        RIGHT ATRIUM           Index LA diam:        3.90 cm 1.85 cm/m   RA Area:     13.20 cm LA Vol (A2C):   47.2 ml 22.40 ml/m  RA Volume:   28.50 ml  13.52 ml/m LA Vol (A4C):   47.2 ml 22.40 ml/m LA Biplane Vol: 48.6 ml 23.06 ml/m AORTIC VALVE LVOT Vmax:   79.90 cm/s LVOT Vmean:  53.400 cm/s LVOT VTI:    0.168 m  AORTA Ao Root diam: 3.30 cm  MITRAL VALVE MV Area (PHT): 3.60 cm    SHUNTS MV Decel Time: 211 msec    Systemic VTI:  0.17 m MV E velocity: 77.40 cm/s  Systemic Diam: 2.20 cm MV A velocity: 92.10 cm/s MV E/A ratio:  0.84  Lonni Nanas MD Electronically signed by Lonni Nanas MD Signature Date/Time: 11/15/2023/6:05:14 PM    Final   TEE  ECHO INTRAOPERATIVE TEE 11/16/2023  Narrative *INTRAOPERATIVE TRANSESOPHAGEAL REPORT *    Patient Name:   Anthony Boone Date of Exam: 11/16/2023 Medical Rec #:  [id number]      Height:       68.0 in Accession #:    [id number]     Weight:  215.0 lb Date of Birth:  14-Aug-1955      BSA:          2.11 m Patient Age:    68 years       BP:           189/69 mmHg Patient Gender: M              HR:           72 bpm. Exam Location:  Anesthesiology  Transesophogeal exam  was perform intraoperatively during surgical procedure. Patient was closely monitored under general anesthesia during the entirety of examination.  Indications:     CABG Performing Phys: [id number] BAILEY S SU  Complications: No known complications during this procedure. POST-OP IMPRESSIONS _ Right Ventricle: mildly reduced function. The cavity was mildly dilated. _ Aorta: The aorta appears unchanged from pre-bypass. _ Left Atrial Appendage: The left atrial appendage appears unchanged from pre-bypass. _ Aortic Valve: The aortic valve appears unchanged from pre-bypass. _ Mitral Valve: There is mild regurgitation. _ Tricuspid Valve: There is moderate regurgitation.Discussed with Dr. Daniel and Kerrin. _ Pulmonic Valve: The pulmonic valve appears unchanged from pre-bypass.  PRE-OP FINDINGS Left Ventricle: The left ventricle has normal systolic function, with an ejection fraction of 55-60%. The cavity size was normal. There is mild concentric left ventricular hypertrophy.   Right Ventricle: The right ventricle has normal systolic function. The cavity was normal. There is no increase in right ventricular wall thickness.  Left Atrium: Left atrial size was normal in size. No left atrial/left atrial appendage thrombus was detected.  Right Atrium: Right atrial size was normal in size.  Interatrial Septum: No atrial level shunt detected by color flow Doppler. There is no evidence of a patent foramen ovale.  Pericardium: There is no evidence of pericardial effusion.  Mitral Valve: The mitral valve is normal in structure. Mitral valve regurgitation is trivial by color flow Doppler. There is no evidence of mitral valve vegetation. There is No evidence of mitral stenosis. There is mild thickening present.  Tricuspid Valve: The tricuspid valve was normal in structure. Tricuspid valve regurgitation is trivial by color flow Doppler. There is no evidence of tricuspid valve vegetation.  Aortic Valve:  The aortic valve is normal in structure. Aortic valve regurgitation was not visualized by color flow Doppler. There is no stenosis of the aortic valve. There is no evidence of aortic valve vegetation.   Pulmonic Valve: The pulmonic valve was normal in structure. Pulmonic valve regurgitation is trivial by color flow Doppler.   Aorta: There is evidence of plaque in the descending aorta; Grade II, measuring 2-46mm in size.  Shunts: There is no evidence of an atrial septal defect.   Norleen Pope MD Electronically signed by Norleen Pope MD Signature Date/Time: 11/16/2023/4:01:47 PM    Final  MONITORS  LONG TERM MONITOR (3-14 DAYS) 11/02/2023  Narrative Patch Wear Time:  13 days and 15 hours (2025-08-01T19:09:38-0400 to 2025-08-15T10:22:16-0400)  HR 35 - 132, average 71 bpm. 1 nonsustained SVT (longest 8 beats). No atrial fibrillation detected. Rare supraventricular ectopy. Rare ventricular ectopy. No sustained arrhythmias. Symptom trigger episodes correspond to sinus rhythm.   Fonda Kitty Cardiac Electrophysiology   CT SCANS  CT CORONARY MORPH W/CTA COR W/SCORE 10/31/2023  Addendum 11/03/2023  9:13 PM ADDENDUM REPORT: 11/03/2023 21:11  EXAM: OVER-READ INTERPRETATION  CT CHEST  The following report is an over-read performed by radiologist Dr. Suzen Dials of [facility] on 11/03/2023. This over-read does not include interpretation of cardiac  or coronary anatomy or pathology. The coronary calcium  score/coronary CTA interpretation by the cardiologist is attached.  COMPARISON:  None.  FINDINGS: Cardiovascular: There are no significant extracardiac vascular findings.  Mediastinum/Nodes: There are no enlarged lymph nodes within the visualized mediastinum.  Lungs/Pleura: There is no pleural effusion. Mild right middle lobe linear scarring and/or atelectasis is seen. Mild atelectatic changes are also noted within the posterior aspect of the left  lung base.  Upper abdomen: No significant findings in the visualized upper abdomen.  Musculoskeletal/Chest wall: No chest wall mass or suspicious osseous findings within the visualized chest.  IMPRESSION: No significant extracardiac findings within the visualized chest.   Electronically Signed By: Suzen Dials M.D. On: 11/03/2023 21:11  Narrative CLINICAL DATA:  CP  EXAM: Cardiac/Coronary  CTA  TECHNIQUE: The patient was scanned on a GE Apex scanner.  FINDINGS: A 120 kV prospective scan was triggered in the descending thoracic aorta at 111 HU's. Axial non-contrast 3 mm slices were carried out through the heart. The data set was analyzed on a dedicated work station and scored using the Agatson method. Gantry rotation speed was 250 msecs and collimation was .6 mm. No beta blockade and 0.8 mg of sl NTG was given. The 3D data set was reconstructed at 75% of the R-R cycle. Diastolic phases were analyzed on a dedicated work station using MPR, MIP and VRT modes. The patient received 80 cc of contrast.  Aorta: Normal size.  No calcifications.  No dissection.  Aortic Valve:  Trileaflet.  No calcifications.  Coronary Arteries:  Normal coronary origin.  Right dominance.  RCA is a large dominant artery that gives rise to PDA and PLA. This artery is diffusely diseased in its proximal and mid portion with numerous mixed plaques and moderate stenosis of 50-69%.  Left main is a large artery that gives rise to LAD and LCX arteries as well as moderate size Intermediate Branch. In the distal portion of LM there is mixed plaque with mild stenosis of 25-49%.  LAD is a large vessel that diffusely diseased in its proximal and mid portion with numerous mostly calcified plaques with moderate stenosis of 50-69%. This artery gives rise to small D1 and large D2. D2 has mixed plaque in its proximal portion with moderate stenosis of 50-69%.  Intermediate Branch has mixed plaques in its  proximal portion with mild stenosis of 25-49%.  LCX is a non-dominant artery that gives rise to one large OM1 branch. There is mixed plaque in the proximal portion of this artery with severe stenosis of 70-99%. Large OM1 has mixed plaque in its proximal with moderate stenosis of 50-69%.  Other findings:  Normal pulmonary vein drainage into the left atrium. Rt middle pulmonary vein noted - normal variant.  Normal left atrial appendage without a thrombus.  Normal size of the pulmonary artery.  IMPRESSION: 1. Coronary calcium  score of 1393. This was 100 percentile for age and sex matched control.  2. Normal coronary origin with right dominance.  3. CAD-RADS 4 Severe stenosis. (70-99%) prox CX. Cardiac catheterization or CT FFR is recommended. Consider symptom-guided anti-ischemic pharmacotherapy as well as risk factor modification per guideline directed care.  4. Plaque analysis: TPV 1478 mm3, 91 st percentile, calcified plaque 407 mm3, non calcified plaque 1071 mm3, Extensive TPV.  Electronically Signed: By: Lamar Fitch M.D. On: 11/03/2023 18:20     ______________________________________________________________________________________________          Recent Labs: 10/09/2023: TSH 1.48 11/23/2023: B Natriuretic Peptide 73.7; Magnesium  2.0  12/06/2023: Hemoglobin 10.6; Platelets 186 01/03/2024: ALT 21; BUN 15; Creatinine, Ser 1.14; Potassium 4.9; Sodium 143  Recent Lipid Panel    Component Value Date/Time   CHOL 97 (L) 01/03/2024 0848   TRIG 90 01/03/2024 0848   HDL 35 (L) 01/03/2024 0848   CHOLHDL 2.8 01/03/2024 0848   CHOLHDL 4 10/09/2023 1546   VLDL 65.2 (H) 10/09/2023 1546   LDLCALC 44 01/03/2024 0848   LDLCALC 95 12/23/2022 1117    Physical Exam:    VS:  BP (!) 140/76   Pulse 64   Ht 5' 7 (1.702 m)   Wt 215 lb 6.4 oz (97.7 kg)   SpO2 97%   BMI 33.74 kg/m     Wt Readings from Last 3 Encounters:  02/27/24 215 lb 6.4 oz (97.7 kg)  02/05/24 214  lb (97.1 kg)  02/01/24 212 lb 12.8 oz (96.5 kg)     GEN:  Well nourished, well developed in no acute distress HEENT: Normal NECK: No JVD; No carotid bruits LYMPHATICS: No lymphadenopathy CARDIAC: RRR, no murmurs, rubs, gallops RESPIRATORY:  Clear to auscultation without rales, wheezing or rhonchi  ABDOMEN: Soft, non-tender, non-distended MUSCULOSKELETAL:  No edema; No deformity  SKIN: Warm and dry NEUROLOGIC:  Alert and oriented x 3 PSYCHIATRIC:  Normal affect    Signed, Redell Leiter, MD  02/27/2024 9:19 AM    [facility]      [1]  Current Meds  Medication Sig   amLODipine  (NORVASC ) 5 MG tablet TAKE 1 TABLET (5 MG TOTAL) BY MOUTH DAILY.   ascorbic acid (VITAMIN C) 500 MG tablet Take 1,000 mg by mouth daily.   aspirin  EC 81 MG tablet Take 81 mg by mouth daily. Swallow whole.   furosemide  (LASIX ) 20 MG tablet Take 1 tablet (20 mg total) by mouth daily as needed for edema (for weight gain of 3 pds in 1 day or 5 pds in 1 week).   lisinopril  (ZESTRIL ) 20 MG tablet Take 1 tablet (20 mg total) by mouth daily for blood pressure   metoprolol  tartrate (LOPRESSOR ) 25 MG tablet Take 1 tablet (25 mg total) by mouth 2 (two) times daily.   omeprazole  (PRILOSEC) 20 MG capsule Take 1 capsule (20 mg total) by mouth daily. (Patient taking differently: Take 40 mg by mouth daily.)

## 2024-02-27 ENCOUNTER — Encounter: Payer: Self-pay | Admitting: Cardiology

## 2024-02-27 ENCOUNTER — Ambulatory Visit: Admitting: Cardiology

## 2024-02-27 VITALS — BP 140/76 | HR 64 | Ht 67.0 in | Wt 215.4 lb

## 2024-02-27 DIAGNOSIS — E7841 Elevated Lipoprotein(a): Secondary | ICD-10-CM

## 2024-02-27 DIAGNOSIS — Z951 Presence of aortocoronary bypass graft: Secondary | ICD-10-CM | POA: Diagnosis not present

## 2024-02-27 DIAGNOSIS — I10 Essential (primary) hypertension: Secondary | ICD-10-CM | POA: Diagnosis not present

## 2024-02-27 DIAGNOSIS — E782 Mixed hyperlipidemia: Secondary | ICD-10-CM | POA: Diagnosis not present

## 2024-02-27 MED ORDER — BEMPEDOIC ACID 180 MG PO TABS: 180.0000 mg | ORAL_TABLET | Freq: Every day | ORAL | 3 refills | Status: AC

## 2024-02-27 MED ORDER — LISINOPRIL 20 MG PO TABS: 20.0000 mg | ORAL_TABLET | Freq: Every day | ORAL | 3 refills | Status: AC

## 2024-02-27 MED ORDER — OMEPRAZOLE 40 MG PO CPDR: 40.0000 mg | DELAYED_RELEASE_CAPSULE | Freq: Every day | ORAL | 3 refills | Status: AC

## 2024-02-27 MED ORDER — METOPROLOL TARTRATE 25 MG PO TABS: 25.0000 mg | ORAL_TABLET | Freq: Two times a day (BID) | ORAL | 3 refills | Status: AC

## 2024-02-27 NOTE — Patient Instructions (Addendum)
 Medication Instructions:  Your physician has recommended you make the following change in your medication:   START: Bempedoic Acid  180 mg daily (Wait two weeks to start) START: Prilosec 40 mg daily  *If you need a refill on your cardiac medications before your next appointment, please call your pharmacy*  Lab Work: Your physician recommends that you return for lab work in:   Labs in 2 months: Lipids, Apo B  If you have labs (blood work) drawn today and your tests are completely normal, you will receive your results only by: MyChart Message (if you have MyChart) OR A paper copy in the mail If you have any lab test that is abnormal or we need to change your treatment, we will call you to review the results.  Testing/Procedures: Your physician has requested that you have a lower or upper extremity venous duplex. This test is an ultrasound of the veins in the legs or arms. It looks at venous blood flow that carries blood from the heart to the legs or arms. Allow one hour for a Lower Venous exam. Allow thirty minutes for an Upper Venous exam. There are no restrictions or special instructions.  Please note: We ask at that you not bring children with you during ultrasound (echo/ vascular) testing. Due to room size and safety concerns, children are not allowed in the ultrasound rooms during exams. Our front office staff cannot provide observation of children in our lobby area while testing is being conducted. An adult accompanying a patient to their appointment will only be allowed in the ultrasound room at the discretion of the ultrasound technician under special circumstances. We apologize for any inconvenience.   Follow-Up: At [facility], you and your health needs are our priority.  As part of our continuing mission to provide you with exceptional heart care, our providers are all part of one team.  This team includes your primary Cardiologist (physician) and Advanced Practice Providers  or APPs (Physician Assistants and Nurse Practitioners) who all work together to provide you with the care you need, when you need it.  Your next appointment:   6 month(s)  Provider:   Redell Leiter, MD    We recommend signing up for the patient portal called MyChart.  Sign up information is provided on this After Visit Summary.  MyChart is used to connect with patients for Virtual Visits (Telemedicine).  Patients are able to view lab/test results, encounter notes, upcoming appointments, etc.  Non-urgent messages can be sent to your provider as well.   To learn more about what you can do with MyChart, go to [email or web address].   Other Instructions Please keep a BP log for 2 weeks and send by MyChart or mail.                      Dr. Leiter [address] [place], [place] [address]  Blood Pressure Record Sheet To take your blood pressure, you will need a blood pressure machine. You can buy a blood pressure machine (blood pressure monitor) at your clinic, drug store, or online. When choosing one, consider: An automatic monitor that has an arm cuff. A cuff that wraps snugly around your upper arm. You should be able to fit only one finger between your arm and the cuff. A device that stores blood pressure reading results. Do not choose a monitor that measures your blood pressure from your wrist or finger. Follow your health care provider's instructions for how to take your  blood pressure. To use this form: Get one reading in the morning (a.m.) 1-2 hours after you take any medicines. Get one reading in the evening (p.m.) before supper.   Blood pressure log Date: _______________________  a.m. _____________________(1st reading) HR___________            p.m. _____________________(2nd reading) HR__________  Date: _______________________  a.m. _____________________(1st reading) HR___________            p.m. _____________________(2nd reading) HR__________  Date:  _______________________  a.m. _____________________(1st reading) HR___________            p.m. _____________________(2nd reading) HR__________  Date: _______________________  a.m. _____________________(1st reading) HR___________            p.m. _____________________(2nd reading) HR__________  Date: _______________________  a.m. _____________________(1st reading) HR___________            p.m. _____________________(2nd reading) HR__________  Date: _______________________  a.m. _____________________(1st reading) HR___________            p.m. _____________________(2nd reading) HR__________  Date: _______________________  a.m. _____________________(1st reading) HR___________            p.m. _____________________(2nd reading) HR__________   This information is not intended to replace advice given to you by your health care provider. Make sure you discuss any questions you have with your health care provider. Document Revised: 06/19/2019 Document Reviewed: 06/19/2019 Elsevier Patient Education  2021 Elsevier Inc. \             [employer]  Tips to measure your blood pressure correctly  To determine whether you have hypertension, a medical professional will take a blood pressure reading. How you prepare for the test, the position of your arm, and other factors can change a blood pressure reading by 10% or more. That could be enough to hide high blood pressure, start you on a drug you don't really need, or lead your doctor to incorrectly adjust your medications. National and international guidelines offer specific instructions for measuring blood pressure. If a doctor, nurse, or medical assistant isn't doing it right, don't hesitate to ask him or her to get with the guidelines. Here's what you can do to ensure a correct reading:  Don't drink a caffeinated beverage or smoke during the 30 minutes before the test.  Sit quietly for five minutes before the test begins.  During  the measurement, sit in a chair with your feet on the floor and your arm supported so your elbow is at about heart level.  The inflatable part of the cuff should completely cover at least 80% of your upper arm, and the cuff should be placed on bare skin, not over a shirt.  Don't talk during the measurement.  Have your blood pressure measured twice, with a brief break in between. If the readings are different by 5 points or more, have it done a third time. There are times to break these rules. If you sometimes feel lightheaded when getting out of bed in the morning or when you stand after sitting, you should have your blood pressure checked while seated and then while standing to see if it falls from one position to the next. Because blood pressure varies throughout the day, your doctor will rarely diagnose hypertension on the basis of a single reading. Instead, he or she will want to confirm the measurements on at least two occasions, usually within a few weeks of one another. The exception to this rule is if you have a blood pressure reading of 180/110 mm Hg or  higher. A result this high usually calls for prompt treatment. It's also a good idea to have your blood pressure measured in both arms at least once, since the reading in one arm (usually the right) may be higher than that in the left. A 2014 study in The American Journal of Medicine of nearly 3,400 people found average arm- to-arm differences in systolic blood pressure of about 5 points. The higher number should be used to make treatment decisions. In 2017, new guidelines from the American Heart Association, the [employer] of Cardiology, and nine other health organizations lowered the diagnosis of high blood pressure to 130/80 mm Hg or higher for all adults. The guidelines also redefined the various blood pressure categories to now include normal, elevated, Stage 1 hypertension, Stage 2 hypertension, and hypertensive crisis (see Blood pressure  categories). Blood pressure categories  Blood pressure category SYSTOLIC (upper number)  DIASTOLIC (lower number)  Normal Less than 120 mm Hg and Less than 80 mm Hg  Elevated 120-129 mm Hg and Less than 80 mm Hg  High blood pressure: Stage 1 hypertension 130-139 mm Hg or 80-89 mm Hg  High blood pressure: Stage 2 hypertension 140 mm Hg or higher or 90 mm Hg or higher  Hypertensive crisis (consult your doctor immediately) Higher than 180 mm Hg and/or Higher than 120 mm Hg  Source: American Heart Association and American Stroke Association. For more on getting your blood pressure under control, buy Controlling Your Blood Pressure, a Special Health Report from [facility].

## 2024-02-28 ENCOUNTER — Ambulatory Visit: Attending: Cardiology

## 2024-02-28 ENCOUNTER — Ambulatory Visit: Payer: Self-pay | Admitting: Cardiology

## 2024-02-28 ENCOUNTER — Ambulatory Visit: Admitting: Family Medicine

## 2024-02-28 DIAGNOSIS — I10 Essential (primary) hypertension: Secondary | ICD-10-CM | POA: Diagnosis not present

## 2024-02-28 DIAGNOSIS — Z951 Presence of aortocoronary bypass graft: Secondary | ICD-10-CM | POA: Diagnosis not present

## 2024-02-28 DIAGNOSIS — E782 Mixed hyperlipidemia: Secondary | ICD-10-CM | POA: Insufficient documentation

## 2024-02-28 DIAGNOSIS — E7841 Elevated Lipoprotein(a): Secondary | ICD-10-CM | POA: Insufficient documentation

## 2024-02-28 DIAGNOSIS — Z86718 Personal history of other venous thrombosis and embolism: Secondary | ICD-10-CM | POA: Diagnosis not present

## 2024-03-18 ENCOUNTER — Telehealth: Payer: Self-pay | Admitting: Cardiology

## 2024-03-18 NOTE — Telephone Encounter (Signed)
 Patient is asking for a generic of the Nexlizet he believes that was the name of the medication. CB # [id number] He said its way too expensive.

## 2024-03-21 ENCOUNTER — Other Ambulatory Visit (HOSPITAL_COMMUNITY): Payer: Self-pay

## 2024-03-21 ENCOUNTER — Telehealth: Payer: Self-pay | Admitting: Pharmacy Technician

## 2024-03-21 NOTE — Telephone Encounter (Signed)
 Patient does not have insurance. There is no assistance for the uninsured for nexlizet.   Sent note to see if he can be changed to repatha or praluent and then we can send paperwork

## 2024-03-25 ENCOUNTER — Other Ambulatory Visit (HOSPITAL_COMMUNITY): Payer: Self-pay

## 2024-03-25 NOTE — Telephone Encounter (Signed)
 Sent message to get pharmacy insurance. No active insurance is found

## 2024-03-27 ENCOUNTER — Other Ambulatory Visit (HOSPITAL_COMMUNITY): Payer: Self-pay

## 2024-03-27 NOTE — Telephone Encounter (Signed)
 Tried to call patient on both numbers and they are not working  I called [facility] and got Chief Executive Officer Encounter   Received notification from [employer] Messages that prior authorization for nexletol  is required/requested.   Insurance verification completed.   The patient is insured through [employer].   Per test claim: PA required; PA started via CoverMyMeds. KEY [id number] . Waiting for clinical questions to populate.   Patient on nexletol  not nexlizet  Test claim showed needs pa but latent saying patient data not matched

## 2024-03-28 NOTE — Telephone Encounter (Signed)
 Pharmacy Patient Advocate Encounter  Received notification from [employer] that Prior Authorization for nexletol  has been APPROVED from 03/28/24 to 03/13/25   PA #/Case ID/Reference #: [id number]

## 2024-03-28 NOTE — Telephone Encounter (Signed)
 Pharmacy Patient Advocate Encounter   Received notification from [employer] that prior authorization for nexletol  is required/requested.   Insurance verification completed.   The patient is insured through [facility].   Per test claim: PA required; PA submitted to above mentioned insurance via Latent Key/confirmation #/EOC [facility] Status is pending

## 2024-04-03 ENCOUNTER — Other Ambulatory Visit (HOSPITAL_COMMUNITY): Payer: Self-pay

## 2024-04-03 ENCOUNTER — Telehealth: Payer: Self-pay | Admitting: Pharmacy Technician

## 2024-04-03 NOTE — Telephone Encounter (Signed)
 Patient Advocate Encounter   The patient was approved for a [employer] grant that will help cover the cost of nexletol  Total amount awarded, 2500.  Effective: 03/04/24 - 03/03/25   APW:[id number] ERW:[id number] Hmnle:[id number] PI:[id number] [employer] ID: [id number]   Pharmacy provided with approval and processing information. Patient informed via mychart

## 2024-04-17 ENCOUNTER — Telehealth: Payer: Self-pay

## 2024-04-17 MED ORDER — FUROSEMIDE 20 MG PO TABS: 20.0000 mg | ORAL_TABLET | Freq: Every day | ORAL | 5 refills | Status: AC | PRN

## 2024-04-17 NOTE — Telephone Encounter (Signed)
 Lasix  refilled.  Pt has refills of the rest of cardiac medications at [facility].

## 2024-04-17 NOTE — Telephone Encounter (Signed)
 Patient is covered through 03/03/25 for nexletol  and is ready for this rx to be sent to [facility] on [address] in [place]. Is also concerned that he does not have any refills left on his heart meds at the pharmacy.

## 2024-04-19 ENCOUNTER — Ambulatory Visit: Admitting: Family Medicine

## 2024-04-19 NOTE — Telephone Encounter (Signed)
 Called the patient and informed him that his Lasix  had been refilled and his other cardiac medications had re-fills at [facility], his [facility].Patient verbalized understanding and had no further questions at this time.

## 2024-04-26 ENCOUNTER — Ambulatory Visit: Admitting: Family Medicine

## 2024-05-09 ENCOUNTER — Ambulatory Visit: Payer: Self-pay | Admitting: Cardiology
